# Patient Record
Sex: Male | Born: 1940 | Race: Black or African American | Hispanic: No | Marital: Single | Smoking: Former smoker
Health system: Southern US, Community
[De-identification: ages and names within clinical notes are randomized; demographics above are authoritative.]

## PROBLEM LIST (undated history)

## (undated) DIAGNOSIS — N390 Urinary tract infection, site not specified: Secondary | ICD-10-CM

## (undated) DIAGNOSIS — I639 Cerebral infarction, unspecified: Secondary | ICD-10-CM

## (undated) DIAGNOSIS — B019 Varicella without complication: Secondary | ICD-10-CM

## (undated) DIAGNOSIS — I209 Angina pectoris, unspecified: Secondary | ICD-10-CM

## (undated) DIAGNOSIS — E119 Type 2 diabetes mellitus without complications: Secondary | ICD-10-CM

## (undated) DIAGNOSIS — D492 Neoplasm of unspecified behavior of bone, soft tissue, and skin: Secondary | ICD-10-CM

## (undated) DIAGNOSIS — N529 Male erectile dysfunction, unspecified: Secondary | ICD-10-CM

## (undated) DIAGNOSIS — E785 Hyperlipidemia, unspecified: Secondary | ICD-10-CM

## (undated) DIAGNOSIS — I10 Essential (primary) hypertension: Secondary | ICD-10-CM

## (undated) DIAGNOSIS — I739 Peripheral vascular disease, unspecified: Secondary | ICD-10-CM

## (undated) DIAGNOSIS — K635 Polyp of colon: Secondary | ICD-10-CM

## (undated) DIAGNOSIS — R259 Unspecified abnormal involuntary movements: Secondary | ICD-10-CM

## (undated) DIAGNOSIS — F4310 Post-traumatic stress disorder, unspecified: Secondary | ICD-10-CM

## (undated) DIAGNOSIS — M1990 Unspecified osteoarthritis, unspecified site: Secondary | ICD-10-CM

## (undated) DIAGNOSIS — G4730 Sleep apnea, unspecified: Secondary | ICD-10-CM

## (undated) HISTORY — DX: Essential (primary) hypertension: I10

## (undated) HISTORY — DX: Unspecified osteoarthritis, unspecified site: M19.90

## (undated) HISTORY — DX: Type 2 diabetes mellitus without complications: E11.9

## (undated) HISTORY — PX: KNEE SURGERY: SHX244

## (undated) HISTORY — DX: Hyperlipidemia, unspecified: E78.5

## (undated) HISTORY — DX: Unspecified abnormal involuntary movements: R25.9

## (undated) HISTORY — DX: Peripheral vascular disease, unspecified: I73.9

## (undated) HISTORY — DX: Polyp of colon: K63.5

## (undated) HISTORY — DX: Male erectile dysfunction, unspecified: N52.9

## (undated) HISTORY — DX: Neoplasm of unspecified behavior of bone, soft tissue, and skin: D49.2

## (undated) HISTORY — PX: CARPAL TUNNEL RELEASE: SHX101

## (undated) HISTORY — PX: SHOULDER SURGERY: SHX246

## (undated) HISTORY — PX: NECK SURGERY: SHX720

## (undated) HISTORY — PX: HAND SURGERY: SHX662

## (undated) HISTORY — DX: Varicella without complication: B01.9

---

## 2005-03-31 ENCOUNTER — Encounter: Payer: Self-pay | Admitting: Family Medicine

## 2005-04-14 ENCOUNTER — Encounter: Payer: Self-pay | Admitting: Family Medicine

## 2008-12-16 ENCOUNTER — Encounter (INDEPENDENT_AMBULATORY_CARE_PROVIDER_SITE_OTHER): Payer: Self-pay | Admitting: *Deleted

## 2008-12-16 LAB — CONVERTED CEMR LAB
LDL Cholesterol: 89 mg/dL
Triglycerides: 78 mg/dL

## 2009-01-28 ENCOUNTER — Encounter: Payer: Self-pay | Admitting: Family Medicine

## 2009-09-29 ENCOUNTER — Ambulatory Visit: Payer: Self-pay | Admitting: Family Medicine

## 2009-09-29 DIAGNOSIS — R259 Unspecified abnormal involuntary movements: Secondary | ICD-10-CM | POA: Insufficient documentation

## 2009-09-29 DIAGNOSIS — E119 Type 2 diabetes mellitus without complications: Secondary | ICD-10-CM

## 2009-09-29 DIAGNOSIS — I10 Essential (primary) hypertension: Secondary | ICD-10-CM | POA: Insufficient documentation

## 2009-09-29 DIAGNOSIS — M25559 Pain in unspecified hip: Secondary | ICD-10-CM | POA: Insufficient documentation

## 2009-09-29 DIAGNOSIS — F17200 Nicotine dependence, unspecified, uncomplicated: Secondary | ICD-10-CM

## 2009-09-29 DIAGNOSIS — E785 Hyperlipidemia, unspecified: Secondary | ICD-10-CM | POA: Insufficient documentation

## 2009-09-29 DIAGNOSIS — E1169 Type 2 diabetes mellitus with other specified complication: Secondary | ICD-10-CM | POA: Insufficient documentation

## 2009-09-29 DIAGNOSIS — M25519 Pain in unspecified shoulder: Secondary | ICD-10-CM

## 2009-11-01 ENCOUNTER — Encounter: Payer: Self-pay | Admitting: Family Medicine

## 2009-11-11 ENCOUNTER — Ambulatory Visit: Payer: Self-pay | Admitting: Family Medicine

## 2009-11-11 DIAGNOSIS — N529 Male erectile dysfunction, unspecified: Secondary | ICD-10-CM

## 2009-11-17 ENCOUNTER — Encounter (INDEPENDENT_AMBULATORY_CARE_PROVIDER_SITE_OTHER): Payer: Self-pay | Admitting: *Deleted

## 2009-11-17 DIAGNOSIS — Z860100 Personal history of colon polyps, unspecified: Secondary | ICD-10-CM | POA: Insufficient documentation

## 2009-11-17 LAB — HM COLONOSCOPY

## 2009-12-06 ENCOUNTER — Ambulatory Visit: Payer: Self-pay | Admitting: Family Medicine

## 2009-12-06 DIAGNOSIS — D485 Neoplasm of uncertain behavior of skin: Secondary | ICD-10-CM | POA: Insufficient documentation

## 2009-12-06 DIAGNOSIS — I491 Atrial premature depolarization: Secondary | ICD-10-CM

## 2009-12-08 ENCOUNTER — Telehealth (INDEPENDENT_AMBULATORY_CARE_PROVIDER_SITE_OTHER): Payer: Self-pay | Admitting: *Deleted

## 2009-12-08 ENCOUNTER — Encounter: Payer: Self-pay | Admitting: Cardiology

## 2009-12-08 ENCOUNTER — Ambulatory Visit: Payer: Self-pay | Admitting: Cardiology

## 2009-12-08 DIAGNOSIS — I739 Peripheral vascular disease, unspecified: Secondary | ICD-10-CM | POA: Insufficient documentation

## 2009-12-08 DIAGNOSIS — M79609 Pain in unspecified limb: Secondary | ICD-10-CM

## 2009-12-08 DIAGNOSIS — R9431 Abnormal electrocardiogram [ECG] [EKG]: Secondary | ICD-10-CM | POA: Insufficient documentation

## 2009-12-08 DIAGNOSIS — R079 Chest pain, unspecified: Secondary | ICD-10-CM | POA: Insufficient documentation

## 2009-12-08 LAB — CONVERTED CEMR LAB
ALT: 20 units/L (ref 0–53)
AST: 18 units/L (ref 0–37)
Albumin: 4.2 g/dL (ref 3.5–5.2)
Alkaline Phosphatase: 156 units/L — ABNORMAL HIGH (ref 39–117)
CO2: 26 meq/L (ref 19–32)
Calcium: 9.2 mg/dL (ref 8.4–10.5)
Creatinine, Ser: 0.7 mg/dL (ref 0.4–1.5)
GFR calc non Af Amer: 136.63 mL/min (ref >60)
Glucose, Bld: 95 mg/dL (ref 70–99)
HDL: 69.9 mg/dL (ref >39.00)
Hgb A1c MFr Bld: 5.8 % (ref 4.6–6.5)
Sodium: 132 meq/L — ABNORMAL LOW (ref 135–145)
Total CHOL/HDL Ratio: 3
Triglycerides: 76 mg/dL (ref 0.0–149.0)

## 2009-12-13 ENCOUNTER — Ambulatory Visit: Payer: Self-pay | Admitting: Family Medicine

## 2009-12-14 ENCOUNTER — Telehealth (INDEPENDENT_AMBULATORY_CARE_PROVIDER_SITE_OTHER): Payer: Self-pay | Admitting: Radiology

## 2009-12-14 LAB — CONVERTED CEMR LAB
ALT: 19 units/L (ref 0–53)
BUN: 14 mg/dL (ref 6–23)
Bilirubin, Direct: 0.2 mg/dL (ref 0.0–0.3)
Chloride: 100 meq/L (ref 96–112)
GFR calc non Af Amer: 110.12 mL/min (ref >60)
Potassium: 4.5 meq/L (ref 3.5–5.1)
Sodium: 135 meq/L (ref 135–145)
Total Bilirubin: 0.9 mg/dL (ref 0.3–1.2)
Total Protein: 7.3 g/dL (ref 6.0–8.3)

## 2009-12-15 ENCOUNTER — Encounter: Payer: Self-pay | Admitting: Cardiovascular Disease

## 2009-12-15 ENCOUNTER — Encounter (HOSPITAL_COMMUNITY): Admission: RE | Admit: 2009-12-15 | Discharge: 2010-02-03 | Payer: Self-pay | Admitting: Cardiology

## 2009-12-15 ENCOUNTER — Ambulatory Visit: Payer: Self-pay | Admitting: Cardiovascular Disease

## 2009-12-15 ENCOUNTER — Ambulatory Visit: Payer: Self-pay

## 2009-12-15 ENCOUNTER — Encounter: Payer: Self-pay | Admitting: Cardiology

## 2009-12-17 ENCOUNTER — Telehealth (INDEPENDENT_AMBULATORY_CARE_PROVIDER_SITE_OTHER): Payer: Self-pay | Admitting: *Deleted

## 2009-12-17 LAB — CONVERTED CEMR LAB: PSA: 1.32 ng/mL (ref 0.10–4.00)

## 2010-01-06 LAB — CONVERTED CEMR LAB
ALT: 27 units/L
Creatinine, Ser: 1 mg/dL
HCT: 43.4 %
Hemoglobin: 15.4 g/dL
MCV: 89.7 fL
Platelets: 253 10*3/uL
RDW: 14.1 %
TSH: 1.4 microintl units/mL
WBC: 15.58 10*3/uL

## 2010-01-10 ENCOUNTER — Encounter: Payer: Self-pay | Admitting: Family Medicine

## 2010-01-24 ENCOUNTER — Ambulatory Visit: Payer: Self-pay | Admitting: Family Medicine

## 2010-01-26 ENCOUNTER — Ambulatory Visit: Payer: Self-pay | Admitting: Cardiology

## 2010-01-26 ENCOUNTER — Encounter: Payer: Self-pay | Admitting: Cardiology

## 2010-03-08 ENCOUNTER — Ambulatory Visit: Payer: Self-pay | Admitting: Cardiovascular Disease

## 2010-03-08 LAB — CONVERTED CEMR LAB
BUN: 7 mg/dL (ref 6–23)
Calcium: 9.5 mg/dL (ref 8.4–10.5)
Creatinine, Ser: 0.9 mg/dL (ref 0.4–1.5)
GFR calc non Af Amer: 114.54 mL/min (ref >60)
Glucose, Bld: 92 mg/dL (ref 70–99)
Potassium: 4.9 meq/L (ref 3.5–5.1)

## 2010-03-17 ENCOUNTER — Ambulatory Visit: Payer: Self-pay | Admitting: Cardiology

## 2010-04-01 ENCOUNTER — Encounter: Admission: RE | Admit: 2010-04-01 | Discharge: 2010-04-01 | Payer: Self-pay | Admitting: Orthopedic Surgery

## 2010-04-11 ENCOUNTER — Ambulatory Visit: Payer: Self-pay | Admitting: Cardiovascular Disease

## 2010-04-19 ENCOUNTER — Telehealth (INDEPENDENT_AMBULATORY_CARE_PROVIDER_SITE_OTHER): Payer: Self-pay | Admitting: *Deleted

## 2010-04-28 ENCOUNTER — Ambulatory Visit: Payer: Self-pay | Admitting: Family Medicine

## 2010-04-28 DIAGNOSIS — E669 Obesity, unspecified: Secondary | ICD-10-CM

## 2010-04-28 DIAGNOSIS — N3941 Urge incontinence: Secondary | ICD-10-CM

## 2010-04-28 LAB — HM DIABETES FOOT EXAM

## 2010-04-29 LAB — CONVERTED CEMR LAB
ALT: 26 units/L (ref 0–53)
Albumin: 4.2 g/dL (ref 3.5–5.2)
BUN: 14 mg/dL (ref 6–23)
Chloride: 104 meq/L (ref 96–112)
GFR calc non Af Amer: 111.46 mL/min (ref >60.00)
HDL: 60.8 mg/dL (ref >39.00)
Hgb A1c MFr Bld: 5.7 % (ref 4.6–6.5)
Total Bilirubin: 1 mg/dL (ref 0.3–1.2)
Total CHOL/HDL Ratio: 2
Triglycerides: 128 mg/dL (ref 0.0–149.0)
VLDL: 25.6 mg/dL (ref 0.0–40.0)

## 2010-06-01 ENCOUNTER — Telehealth (INDEPENDENT_AMBULATORY_CARE_PROVIDER_SITE_OTHER): Payer: Self-pay | Admitting: *Deleted

## 2010-06-13 ENCOUNTER — Encounter (INDEPENDENT_AMBULATORY_CARE_PROVIDER_SITE_OTHER): Payer: Self-pay | Admitting: *Deleted

## 2010-06-14 NOTE — Assessment & Plan Note (Signed)
Summary: Kenneth Henson   Visit Type:  Follow-up Primary Provider:  Neena Rhymes MD  CC:  Bilateral leg pain. Right leg worse.  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 The patient complains of longstanding left calf pain with walking over 1-2 years. He has stopped walking, partly due to calf pain and partly from hip arthritis. Pain occurs at 1/4 mile and forces him to stop at about 3/4 mile. He also has right leg pain and bilateral hip pain left worse than right. No ulcers or rest pain.  Current Medications (verified): 1)  Simvastatin 20 Mg Tabs (Simvastatin) .... Take One Tablet Daily 2)  Viagra 100 Mg Tabs (Sildenafil Citrate) .... Take 1/2-1 Tablet As Needed 1 Hour Prior 3)  Metformin Hcl 850 Mg Tabs (Metformin Hcl) .... Take One Tablet Daily 4)  Hydrochlorothiazide 12.5 Mg Caps (Hydrochlorothiazide) .... Take One Tablet Daily 5)  Lisinopril 40 Mg Tabs (Lisinopril) .... Take One Tablet Daily 6)  Onetouch Ultra Test  Strp (Glucose Blood) .... Test Two Times A Day 7)  Naprosyn 500 Mg Tabs (Naproxen) .Marland Kitchen.. 1 Two Times A Day X7-10 Days and Then As Needed For Pain.  Take W/ Food 8)  Onetouch Delica Lancets  Misc (Lancets) .... Use Two Times A Day  Allergies (verified): No Known Drug Allergies  Past History:  Past medical history reviewed for relevance to current acute and chronic problems.  Past Medical History: Reviewed history from 01/26/2010 and no changes required. HYPERTENSION HYPERLIPIDEMIA  CLAUDICATION  NEOPLASM, SKIN, UNCERTAIN BEHAVIOR COLONIC POLYPS, ADENOMATOUS, HX OF ERECTILE DYSFUNCTION, ORGANIC ABNORMAL INVOLUNTARY MOVEMENTS  DIABETES MELLITUS, TYPE II  Arthritis hx of Chicken Pox  Review of Systems          Vital Signs:  Patient profile:   70 year old male Height:      69 inches Weight:      240.25 pounds BMI:     35.61 Pulse rate:   76 / minute Pulse rhythm:   regular Resp:      18 per minute BP sitting:   140 / 84  (left arm) Cuff size:   large  Vitals Entered By: Vikki Ports (April 11, 2010 10:49 AM)  Serial Vital Signs/Assessments:  Time      Position  BP       Pulse  Resp  Temp     By           R Arm     138/80                         Vikki Ports   Physical Exam  General:  Pt alert and oriented, NAD Abd: soft, obese, NT Right leg: fem and PT pulse 2-3+ Left leg: fem pulse 1-2+, PT 2+ trace bilateral pedal edema feet warm   CT Scan  Procedure date:  03/17/2010  Findings:      Aorta:  The lower thoracic and abdominal aorta demonstrates very minimal calcific atherosclerosis particularly of the infrarenal aorta without occlusive process, dissection or significant aneurysm.  The celiac, SMA, single renal arteries, and IMA are all patent off of the aorta.  Negative for mesenteric or renal vascular occlusive disease.   Right Lower Extremity:  Mild calcific atherosclerosis of the right iliac system.  The right common, internal and external iliac arteries are patent.  Very minimal narrowing of the right external iliac artery, less than 50%.  No  significant iliac occlusive process or inflow disease detected.  The right common femoral, profunda femoral, and SFA demonstrate no significant stenosis, or occlusion.  Mild diffuse SFA nonocclusive atherosclerosis noted. Right popliteal artery is patent across the knee.  Anterior tibial, tibial peroneal trunk, posterior tibial, and diminutive peroneal arteries are all patent into the right lower extremity consistent with preserved right lower extremity three-vessel runoff.   Left Lower Extremity:  Diffuse mild calcific atherosclerosis of the left iliac system.  The left common and internal iliac arteries are patent.  The left external iliac artery demonstrates an irregular hypoechoic ulcerated plaque formation extending over approximately 2 cm on the sagittal reconstructions.  There is an  associated significant left external iliac stenosis, narrowing the lumen approximately 50-70%.  The appearance suggests a cleft-like ulcerated plaque formation versus a short segment focal dissection, if there has been prior iliac instrumentation.  Below this left external iliac lesion, the left common femoral, profunda femoral, and SFA remain patent.  Mild nonocclusive left SFA irregularity.  The left popliteal artery is patent across the knee. High origin is noted of the left anterior tibial artery.  Below-the- knee, the anterior tibial, peroneal and posterior tibial arteries patent.  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 His left leg waveforms are monophasic but of good amplitude and his leg pain is clearly multifactorial. He has shoulder replacement surgery approaching in January. I would favor seeing him back in 6 months for followup with continuation of medical therapy until that time. This will allow him to undergo and recover from surgery. We discussed the importance of continued abstinence from cigarettes and the need for weight loss (he has gained 20# since quitting cigarettes). He is on antiplatelet Rx with ASA 81 mg.  Patient Instructions: 1)  Your physician recommends that you schedule a follow-up appointment in: 6 months with Dr. Excell Seltzer 2)  Your physician recommends that you continue on your current medications as directed. Please refer to the Current Medication list given to you today.

## 2010-06-14 NOTE — Assessment & Plan Note (Signed)
Summary: Cardiology Nuclear Testing  Nuclear Med Background Indications for Stress Test: Evaluation for Ischemia   History: Echo  History Comments: '06 Stress Echo:OK per patient  Symptoms: Chest Pain, Chest Pain with Exertion, DOE, Fatigue  Symptoms Comments: Last episode of CP:1-2 weeks.   Nuclear Pre-Procedure Cardiac Risk Factors: Claudication, Family History - CAD, Hypertension, Lipids, NIDDM, Obesity, Smoker, TIA Caffeine/Decaff Intake: none NPO After: 10:00 PM Lungs: Clear.  O2 Sat 97% on RA. IV 0.9% NS with Angio Cath: 22g     IV Site: (R) AC IV Started by: Irean Hong RN Chest Size (in) 46     Height (in): 69 Weight (lb): 222 BMI: 32.90 Tech Comments: The patient had an LEA study today with (L) leg claudication, and has symptoms with walking incline, changed to lexiscan.Patsy Edwards,RN.  Nuclear Med Study 1 or 2 day study:  1 day     Stress Test Type:  Eugenie Birks Reading MD:  Charlton Haws, MD     Referring MD:  Olga Millers, MD Resting Radionuclide:  Technetium 71m Tetrofosmin     Resting Radionuclide Dose:  10.6 mCi  Stress Radionuclide:  Technetium 76m Tetrofosmin     Stress Radionuclide Dose:  33.0 mCi   Stress Protocol   Lexiscan: 0.4 mg   Stress Test Technologist:  Rea [employer] CMA-N     Nuclear Technologist:  Domenic Polite CNMT  Rest Procedure  Myocardial perfusion imaging was performed at rest 45 minutes following the intravenous administration of Myoview Technetium 7m Tetrofosmin.  Stress Procedure  The patient received IV Lexiscan 0.4 mg over 15-seconds.  Myoview injected at 30-seconds.  There were no significant changes with infusion, frequent PAC's.  Quantitative spect images were obtained after a 45 minute delay.  QPS Raw Data Images:  Normal; no motion artifact; normal heart/lung ratio. Stress Images:  NI: Uniform and normal uptake of tracer in all myocardial segments. Rest Images:  Normal homogeneous uptake in all areas of the  myocardium. Subtraction (SDS):  Normal Transient Ischemic Dilatation:  1.04  (Normal <1.22)  Lung/Heart Ratio:  .33  (Normal <0.45)  Quantitative Gated Spect Images QGS EDV:  98 ml QGS ESV:  36 ml QGS EF:  64 % QGS cine images:  normal  Findings Normal nuclear study      Overall Impression  Exercise Capacity: lexiscan BP Response: Normal blood pressure response. Clinical Symptoms: Lightheaded ECG Impression: No significant ST segment change suggestive of ischemia. Overall Impression: Normal stress nuclear study.  Appended Document: Cardiology Nuclear Testing ok  Appended Document: Cardiology Nuclear Testing pt aware of results

## 2010-06-14 NOTE — Progress Notes (Signed)
Summary: labs  Phone Note Outgoing Call   Call placed by: Doristine Devoid CMA,  December 08, 2009 9:06 AM Call placed to: Patient Summary of Call: diabetes is well controlled.  no changes  LDL is slightly higher than the 70 we like to see.  should start taking his cholesterol medicine nightly as directed  Given elevated Alk Phos please add a GGT  Na is mildly low.  needs to increase fluid intake and recheck in 1 week to trend level.   Follow-up for Phone Call        patient aware of labs and appt scheduled to recheck labs.......Marland KitchenDoristine Devoid CMA  December 08, 2009 9:07 AM

## 2010-06-14 NOTE — Medication Information (Signed)
Summary: Vacuum Erection Device/Pos T Vac  Vacuum Erection Device/Pos T Vac   Imported By: Lanelle Bal 11/04/2009 13:22:59  _____________________________________________________________________  External Attachment:    Type:   Image     Comment:   External Document

## 2010-06-14 NOTE — Procedures (Signed)
Summary: [facility]  [facility]   Imported By: Lanelle Bal 11/23/2009 11:41:53  _____________________________________________________________________  External Attachment:    Type:   Image     Comment:   External Document

## 2010-06-14 NOTE — Assessment & Plan Note (Signed)
Summary: NPV/CLAUDICATION   Visit Type:  Initial Consult Primary Provider:  Neena Rhymes MD  CC:  New PV evaluation per Dr.Crenshaw.  History of Present Illness: 70 year-old gentleman presents for initial evaluation of lower extremity PAD. The patient complains of longstanding left calf pain with walking over 1-2 years. He has stopped walking, partly due to calf pain and partly from hip arthritis. Pain occurs at 1/4 mile and forces him to stop at about 3/4 mile. No thigh pain. No right leg pain with walking. He has left hip pain with standing and lying on his left side. No rest pian or ulceration. No history of stroke but does have a history of remote TIA.   Current Medications (verified): 1)  Simvastatin 20 Mg Tabs (Simvastatin) .... Take One Tablet Daily 2)  Viagra 100 Mg Tabs (Sildenafil Citrate) .... Take 1/2-1 Tablet As Needed 1 Hour Prior 3)  Metformin Hcl 850 Mg Tabs (Metformin Hcl) .... Take One Tablet Daily 4)  Vitamin D (Ergocalciferol) 50000 Unit Caps (Ergocalciferol) .... Take One Tablet Weekly 5)  Hydrochlorothiazide 12.5 Mg Caps (Hydrochlorothiazide) .... Take One Tablet Daily 6)  Lisinopril 40 Mg Tabs (Lisinopril) .... Take One Tablet Daily 7)  Onetouch Ultra Test  Strp (Glucose Blood) .... Test Two Times A Day 8)  Naprosyn 500 Mg Tabs (Naproxen) .Marland Kitchen.. 1 Two Times A Day X7-10 Days and Then As Needed For Pain.  Take W/ Food 9)  Onetouch Delica Lancets  Misc (Lancets) .... Use Two Times A Day  Allergies (verified): No Known Drug Allergies  Past History:  Past medical, surgical, family and social histories (including risk factors) reviewed, and no changes noted (except as noted below).  Past Medical History: Reviewed history from 01/26/2010 and no changes required. HYPERTENSION HYPERLIPIDEMIA  CLAUDICATION  NEOPLASM, SKIN, UNCERTAIN BEHAVIOR COLONIC POLYPS, ADENOMATOUS, HX OF ERECTILE DYSFUNCTION, ORGANIC ABNORMAL INVOLUNTARY MOVEMENTS  DIABETES MELLITUS, TYPE II    Arthritis hx of Chicken Pox  Past Surgical History: Reviewed history from 12/08/2009 and no changes required. hand, knee, neck surgeries Carpal Tunnel surgery on right  Family History: Reviewed history from 12/08/2009 and no changes required. HTN-mother DM-no COLON CA-no STROKE-father PROSTATE CA-no  Social History: Reviewed history from 12/08/2009 and no changes required. 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  Vital Signs:  Patient profile:   70 year old male Height:      69 inches Weight:      229.50 pounds BMI:     34.01 Pulse rate:   58 / minute Pulse rhythm:   regular Resp:     18 per minute BP sitting:   120 / 72  (left arm) Cuff size:   large  Vitals Entered By: Vikki Ports (March 08, 2010 9:16 AM)  Serial Vital Signs/Assessments:  Time      Position  BP       Pulse  Resp  Temp     By           R Arm     128/74                         Vikki Ports   Physical Exam  General:  Pt is well-developed, obese male, alert and oriented, no acute distress HEENT:  normal Neck: no thyromegaly           JVP normal, carotid upstrokes normal without bruits Lungs: CTA Chest: equal expansion  CV: Apical impulse nondisplaced, RRR without murmur or gallop Abd: soft, NT, positive BS, no HSM, no bruit Back: no CVA tenderness Ext: no clubbing, cyanosis, or edema        femoral pulses 2+ right, 1+ left with femoral bruit        pedal pulses 2+ PT and trace DP Skin: warm, dry, no rash Neuro: CNII-XII intact,strength 5/5 = b/l    Arterial Doppler  Procedure date:  12/15/2009  Findings:      Suggestion of 1.1 left, 0.94 right, suggestion of left leg inflow disease.  Impression & Recommendations:  Problem # 1:  CLAUDICATION (ICD-443.9) Pt with clinical and  noninvasive findings suggestive of iliac disease. He has lifestyle limiting symptoms, but not at low-level walking. Favor CT angiography to evaluate his aortoiliac anatomy (he is at high risk of aneurysm with history of tobacco, HTN, and PAD). Will review CTA and make further recs pending that study. My threshold for angiography is fairly high as his symptoms are stable and he is able to walk 1/4 mile. Continue risk reduction measures as per Dr Jens Som. He quit smoking 5 weeks ago and I stressed the importance of continued tobacco cessation.  Other Orders: TLB-BMP (Basic Metabolic Panel-BMET) (80048-METABOL) CT Scan  (CT Scan)  Patient Instructions: 1)  Non-Cardiac CT Angiography (CTA), is a special type of CT scan that uses a computer to produce multi-dimensional views of major blood vessels throughout the body. In CT angiography, a contrast material is injected through an IV to help visualize the blood vessels. 2)  Your physician recommends that you have lab work today: BMP 3)  Your physician recommends that you continue on your current medications as directed. Please refer to the Current Medication list given to you today. 4)  Your physician wants you to follow-up in: 1 YEAR.   You will receive a reminder letter in the mail two months in advance. If you don't receive a letter, please call our office to schedule the follow-up appointment. 5)  DO NOT TAKE METFORMIN THE DAY OF CT OR 48 HOURS AFTER CT. DRINK PLENTY OF WATER AFTER TEST.

## 2010-06-14 NOTE — Assessment & Plan Note (Signed)
Summary: rto 2 months/cbs   Vital Signs:  Patient profile:   70 year old male Weight:      226 pounds Pulse rate:   105 / minute BP sitting:   140 / 80  (left arm)  Vitals Entered By: Doristine Devoid CMA (December 06, 2009 11:07 AM) CC: roa 2 month    History of Present Illness: 70 yo man here today for   1) DM- not checking sugars, lost his meter in the move.  no symptomatic lows.  last eye exam was November.  only on Metformin.  has recently had multiple steroid injxns from ortho.  2) Hyperlipidemia- not taking Simvastatin regularly.    3) HTN- elevated today b/c he's 'been on the phone w/ the internet people all morning'.  no CP, SOB, HAs, visual changes, edema.  Preventive Screening-Counseling & Management  Alcohol-Tobacco     Smoking Status: current     Smoking Cessation Counseling: yes     Smoke Cessation Stage: contemplative     Packs/Day: 0.5  Problems Prior to Update: 1)  Colonic Polyps, Adenomatous, Hx of  (ICD-V12.72) 2)  Erectile Dysfunction, Organic  (ICD-607.84) 3)  Abnormal Involuntary Movements  (ICD-781.0) 4)  Hip Pain, Left  (ICD-719.45) 5)  Shoulder, Pain  (ICD-719.41) 6)  Tobacco Use  (ICD-305.1) 7)  Hypertension  (ICD-401.9) 8)  Hyperlipidemia  (ICD-272.4) 9)  Diabetes Mellitus, Type II  (ICD-250.00)  Current Medications (verified): 1)  Simvastatin 20 Mg Tabs (Simvastatin) .... Take One Tablet Daily 2)  Viagra 100 Mg Tabs (Sildenafil Citrate) .... Take 1/2-1 Tablet As Needed 1 Hour Prior 3)  Metformin Hcl 850 Mg Tabs (Metformin Hcl) .... Take One Tablet Daily 4)  Vitamin D (Ergocalciferol) 50000 Unit Caps (Ergocalciferol) .... Take One Tablet Weekly 5)  Hydrochlorothiazide 12.5 Mg Caps (Hydrochlorothiazide) .... Take One Tablet Daily 6)  Lisinopril 40 Mg Tabs (Lisinopril) .... Take One Tablet Daily 7)  Onetouch Ultra Test  Strp (Glucose Blood) .... Test Two Times A Day 8)  Naprosyn 500 Mg Tabs (Naproxen) .Marland Kitchen.. 1 Two Times A Day X7-10 Days and Then As  Needed For Pain.  Take W/ Food 9)  Onetouch Delica Lancets  Misc (Lancets) .... Use Two Times A Day  Allergies (verified): No Known Drug Allergies  Past History:  Past Medical History: Last updated: 11/11/2009 Arthritis hx of Chicken Pox Diabetes mellitus, type II Hyperlipidemia Hypertension ED  Social History: Packs/Day:  0.5  Review of Systems      See HPI  Physical Exam  General:  Well-developed,well-nourished,in no acute distress; alert,appropriate and cooperative throughout examination Head:  NCAT Neck:  No deformities, masses, or tenderness noted. Lungs:  Normal respiratory effort, chest expands symmetrically. Lungs are clear to auscultation, no crackles or wheezes. Heart:  irregular S1/S2 w/ I/VI SEM heard best at RUSB Abdomen:  soft, NT/ND, +BS Pulses:  +2 carotid, radial, DP Extremities:  no C/C/E Skin:  hyperpigmented mole on pt's R forearm, growing per pt's report   Impression & Recommendations:  Problem # 1:  DIABETES MELLITUS, TYPE II (ICD-250.00) Assessment Unchanged pt given new meter.  due for A1C.  stressed importance of diet and exercise. His updated medication list for this problem includes:    Metformin Hcl 850 Mg Tabs (Metformin hcl) .Marland Kitchen... Take one tablet daily    Lisinopril 40 Mg Tabs (Lisinopril) .Marland Kitchen... Take one tablet daily  Orders: Venipuncture (60454) TLB-A1C / Hgb A1C (Glycohemoglobin) (83036-A1C) Prescription Created Electronically [phone])  Problem # 2:  HYPERLIPIDEMIA (ICD-272.4) Assessment: Unchanged  stressed the importance of regular use of medication, reviewed risks of untreated hyperlipidemia including MI and CVA. His updated medication list for this problem includes:    Simvastatin 20 Mg Tabs (Simvastatin) .Marland Kitchen... Take one tablet daily  Orders: TLB-Lipid Panel (80061-LIPID) TLB-Hepatic/Liver Function Pnl (80076-HEPATIC)  Problem # 3:  HYPERTENSION (ICD-401.9) Assessment: Deteriorated BP elevated today but pt upset about  customer service issue.  will follow closely. His updated medication list for this problem includes:    Hydrochlorothiazide 12.5 Mg Caps (Hydrochlorothiazide) .Marland Kitchen... Take one tablet daily    Lisinopril 40 Mg Tabs (Lisinopril) .Marland Kitchen... 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  EKG w/ PACs.  will refer to cards for additional evaluation.  Pt expresses understanding and is in agreement w/ this plan.  Orders: Cardiology Referral (Cardiology) EKG w/ Interpretation (93000)  Problem # 5:  NEOPLASM, SKIN, UNCERTAIN BEHAVIOR (ICD-238.2) Assessment: New  refer to derm for mole on pt's forearm.  Orders: Dermatology Referral (Derma)  Complete Medication List: 1)  Simvastatin 20 Mg Tabs (Simvastatin) .... Take one tablet daily 2)  Viagra 100 Mg Tabs (Sildenafil citrate) .... Take 1/2-1 tablet as needed 1 hour prior 3)  Metformin Hcl 850 Mg Tabs (Metformin hcl) .... Take one tablet daily 4)  Vitamin D (ergocalciferol) 50000 Unit Caps (Ergocalciferol) .... Take one tablet weekly 5)  Hydrochlorothiazide 12.5 Mg Caps (Hydrochlorothiazide) .... Take one tablet daily 6)  Lisinopril 40 Mg Tabs (Lisinopril) .... Take one tablet daily 7)  Onetouch Ultra Test Strp (Glucose blood) .... Test two times a day 8)  Naprosyn 500 Mg Tabs (Naproxen) .Marland Kitchen.. 1 two times a day x7-10 days and then as needed for pain.  take w/ food 9)  Onetouch Delica Lancets Misc (Lancets) .... Use two times a day  Patient Instructions: 1)  Please schedule a follow-up appointment in 3 months to recheck your diabetes. 2)  Someone will call you with your cardiology appt- this is not something to be concerned about, but worth following up 3)  Someone will call you with your derm appt 4)  We'll notify you of your lab results 5)  Call with any questions or  concerns 6)  Hang in there!  Prescriptions: ONETOUCH ULTRA TEST  STRP (GLUCOSE BLOOD) test two times a day  #100 x 2   Entered by:   Doristine Devoid CMA   Authorized by:   Neena Rhymes MD   Signed by:   Doristine Devoid CMA on 12/06/2009   Method used:   Electronically to        [facility] [phone]* (retail)       [address]       [place], [place]  [address]       Ph: [phone]       Fax: [phone]   RxID:   [id number]

## 2010-06-14 NOTE — Progress Notes (Signed)
Summary: labs  Phone Note Outgoing Call   Call placed by: Doristine Devoid CMA,  December 17, 2009 4:01 PM Call placed to: Patient Summary of Call: normal.  given normal PSA and elevated alk phos (that is not liver related) should repeat LFTs in 1 month.  if alk phos still elevated will need bone scan.  Follow-up for Phone Call        spoke w/ patient informed of results appt scheduled for recheck on labs.......Marland KitchenDoristine Devoid CMA  December 17, 2009 4:01 PM

## 2010-06-14 NOTE — Letter (Signed)
Summary: Letter to Patient with Lab Results/[facility]  Letter to Patient with Lab Results/[facility]   Imported By: Lanelle Bal 02/02/2010 09:56:02  _____________________________________________________________________  External Attachment:    Type:   Image     Comment:   External Document  Appended Document: Letter to Patient with Lab Results/[facility] please enter these into the flowsheet  Appended Document: Letter to Patient with Lab Results/[facility] Per Sunny Schlein this has been done.

## 2010-06-14 NOTE — Assessment & Plan Note (Signed)
Summary: [facility]   Visit Type:  Initial Consult  CC:  Abnormal EKG.  History of Present Illness: 70 year old male for evaluation of premature atrial contractions, chest pain and abnormal electrocardiogram. No prior cardiac history. The patient was recently seen by his primary care physician and noted to have PACs on his electrocardiogram. He also has dyspnea with more extreme activities. It is relieved with rest. No orthopnea, PND or pedal edema. He has not had syncope. He also occasionally feels chest pain that he attributes to indigestion. It does not radiate. It is not pleuritic or positional nor is it related to food. It is not exertional. It resolves spontaneously. Because of the above we were asked to further evaluate.  Preventive Screening-Counseling & Management  Alcohol-Tobacco     Smoking Status: current  Current Medications (verified): 1)  Simvastatin 20 Mg Tabs (Simvastatin) .... Take One Tablet Daily 2)  Viagra 100 Mg Tabs (Sildenafil Citrate) .... Take 1/2-1 Tablet As Needed 1 Hour Prior 3)  Metformin Hcl 850 Mg Tabs (Metformin Hcl) .... Take One Tablet Daily 4)  Vitamin D (Ergocalciferol) 50000 Unit Caps (Ergocalciferol) .... Take One Tablet Weekly 5)  Hydrochlorothiazide 12.5 Mg Caps (Hydrochlorothiazide) .... Take One Tablet Daily 6)  Lisinopril 40 Mg Tabs (Lisinopril) .... Take One Tablet Daily 7)  Onetouch Ultra Test  Strp (Glucose Blood) .... Test Two Times A Day 8)  Naprosyn 500 Mg Tabs (Naproxen) .Marland Kitchen.. 1 Two Times A Day X7-10 Days and Then As Needed For Pain.  Take W/ Food 9)  Onetouch Delica Lancets  Misc (Lancets) .... Use Two Times A Day  Allergies (verified): No Known Drug Allergies  Past History:  Past Medical History: Reviewed history from 11/11/2009 and no changes required. Arthritis hx of Chicken Pox Diabetes mellitus, type II Hyperlipidemia Hypertension ED  Past Surgical History: hand, knee, neck surgeries Carpal Tunnel surgery on  right  Family History: Reviewed history from 09/29/2009 and no changes required. HTN-mother DM-no COLON CA-no STROKE-father PROSTATE CA-no  Social History: Reviewed history from 09/29/2009 and no changes required. 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  Alcohol Use - yes  Review of Systems       Patient describes symptoms of claudication in both legs after walking approximately 1/4 of a mile. He also has arthralgias but no fevers or chills, productive cough, hemoptysis, dysphasia, odynophagia, melena, hematochezia, dysuria, hematuria, rash, seizure activity, orthopnea, PND, pedal edema. Remaining systems are negative.   Vital Signs:  Patient profile:   70 year old male Height:      69 inches Weight:      226 pounds BMI:     33.50 Pulse rate:   76 / minute Pulse rhythm:   regular Resp:     18 per minute BP sitting:   110 / 64  (left arm) Cuff size:   large  Vitals Entered By: Vikki Ports (December 08, 2009 9:53 AM)  Physical Exam  General:  Well developed/well nourished in NAD Skin warm/dry Patient not depressed No peripheral clubbing Back-normal HEENT-normal/normal eyelids Neck supple/normal carotid upstroke bilaterally; no bruits; no JVD; no thyromegaly chest - CTA/ normal expansion CV - RRR/normal S1 and S2; no  rubs or gallops;  PMI nondisplaced; 1/6 systolic murmur apex. Abdomen -NT/ND, no HSM, no mass, + bowel sounds, no bruit 2+ femoral pulses, no bruits Ext-no edema, chords, 2+ DP Neuro-grossly nonfocal     EKG  Procedure date:  12/06/2009  Findings:  Sinus rhythm at a rate of 69. Axis normal. Occasional PAC.  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 Add aspirin and continue statin. Schedule ABIs with Doppler. Discontinued tobacco use.  Problem # 2:  CHEST PAIN (ICD-786.50)  Symptoms atypical. However multiple risk factors.  Schedule Myoview for risk stratification. His updated medication list for this problem includes:    Lisinopril 40 Mg Tabs (Lisinopril) .Marland Kitchen... Take one tablet daily  His updated medication list for this problem includes:    Lisinopril 40 Mg Tabs (Lisinopril) .Marland Kitchen... 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 No palpitations or history of syncope. His updated medication list for this problem includes:    Lisinopril 40 Mg Tabs (Lisinopril) .Marland Kitchen... Take one tablet daily  His updated medication list for this problem includes:    Lisinopril 40 Mg Tabs (Lisinopril) .Marland Kitchen... Take one tablet daily  Problem # 4:  TOBACCO USE (ICD-305.1) Patient counseled on discontinuing.  Problem # 5:  HYPERTENSION (ICD-401.9)  Blood pressure controlled on present medications. Renal function and potassium monitored by primary care. His updated medication list for this problem includes:    Hydrochlorothiazide 12.5 Mg Caps (Hydrochlorothiazide) .Marland Kitchen... Take one tablet daily    Lisinopril 40 Mg Tabs (Lisinopril) .Marland Kitchen... Take one tablet daily  His updated medication list for this problem includes:    Hydrochlorothiazide 12.5 Mg Caps (Hydrochlorothiazide) .Marland Kitchen... Take one tablet daily    Lisinopril 40 Mg Tabs (Lisinopril) .Marland Kitchen... Take one tablet daily  Problem # 6:  HYPERLIPIDEMIA (ICD-272.4)  Continue statin. Lipids and liver monitored by primary care. His updated medication list for this problem includes:    Simvastatin 20 Mg Tabs (Simvastatin) .Marland Kitchen... Take one tablet daily  His updated medication list for this problem includes:    Simvastatin 20 Mg Tabs (Simvastatin) .Marland Kitchen... Take one tablet daily  Problem # 7:  DIABETES MELLITUS, TYPE II (ICD-250.00)  Continue present medications. His updated medication list for this problem includes:    Metformin Hcl 850 Mg Tabs (Metformin hcl) .Marland Kitchen... Take one tablet daily    Lisinopril 40 Mg Tabs (Lisinopril) .Marland Kitchen... Take  one tablet daily  His updated medication list for this problem includes:    Metformin Hcl 850 Mg Tabs (Metformin hcl) .Marland Kitchen... Take one tablet daily    Lisinopril 40 Mg Tabs (Lisinopril) .Marland Kitchen... Take one tablet daily  Other Orders: Nuclear Stress Test (Nuc Stress Test) Arterial Duplex Lower Extremity (Arterial Duplex Low)  Patient Instructions: 1)  Your physician recommends that you schedule a follow-up appointment in: [address] Kalyiah Saintil 2)  Your physician recommends that you continue on your current medications as directed. Please refer to the Current Medication list given to you today. 3)  Your physician has requested that you have an ankle brachial index (ABI). During this test an ultrasound and blood pressure cuff are used to evaluate the arteries that supply the arms and legs with blood. Allow thirty minutes for this exam. There are no restrictions or special instructions. 4)  Your physician has requested that you have a lower or upper extremity arterial duplex.  This test is an ultrasound of the arteries in the legs or arms.  It looks at arterial blood flow in the legs and arms.  Allow one hour for Lower and Upper Arterial scans. There are no restrictions or special instructions. 5)  Your physician has requested that you have an exercise stress myoview.  For further information please visit [email or web address]  Please follow instruction sheet, as given.

## 2010-06-14 NOTE — Progress Notes (Signed)
Summary: refill  Phone Note Refill Request Message from:  Fax from Pharmacy on April 19, 2010 10:36 AM  Refills Requested: Medication #1:  METFORMIN HCL 850 MG TABS take one tablet [facility] - fax [phone]  Initial call taken by: Okey Regal [place],  April 19, 2010 10:38 AM    Prescriptions: METFORMIN HCL 850 MG TABS (METFORMIN HCL) take one tablet daily  #30 x 3   Entered by:   Doristine Devoid CMA   Authorized by:   Neena Rhymes MD   Signed by:   Doristine Devoid CMA on 04/19/2010   Method used:   [facility]        [id number]       [address] [place] [address]       [place], [place]  [address]       Ph: [phone]       Fax: [phone]   RxID:   [id number]

## 2010-06-14 NOTE — Miscellaneous (Signed)
  Clinical Lists Changes  Problems: Added new problem of COLONIC POLYPS, ADENOMATOUS, HX OF (ICD-V12.72) - Signed Observations: Added new observation of COLONNXTDUE: 02/2012 (11/17/2009 15:44) Added new observation of PNEUMOVAX: Historical (08/27/2009 15:49) Added new observation of ZOSTAVAX: Historical (08/24/2009 15:49) Added new observation of TD BOOSTER: Historical (08/24/2009 15:49) Added new observation of COLONOSCOPY: Adenomatous Polyp (01/28/2009 15:49) Added new observation of TRIGLYC TOT: 78 mg/dL (16/02/9603 54:09) Added new observation of LDL: 89 mg/dL (81/19/1478 29:56) Added new observation of HDL: 76 mg/dL (21/30/8657 84:69) Added new observation of CHOLESTEROL: 181 mg/dL (62/95/2841 32:44)      Preventive Care Screening  Colonoscopy:    Date:  01/28/2009    Next Due:  02/2012    Results:  Adenomatous Polyp  Last Pneumovax:    Date:  08/27/2009    Results:  Historical  Last Tetanus Booster:    Date:  08/24/2009    Results:  Historical     Immunization History:  Zostavax History:    Zostavax # 1:  historical (08/24/2009)

## 2010-06-14 NOTE — Assessment & Plan Note (Signed)
Summary: discuss ed/cbs   Vital Signs:  Patient profile:   70 year old male Weight:      224 pounds Pulse rate:   70 / minute BP sitting:   130 / 70  (left arm)  Vitals Entered By: Doristine Devoid (November 11, 2009 1:07 PM) CC: discuss ed    History of Present Illness: 70 yo man here today to discuss ED.  has applied for vacuum pump assist device.  has been taking Viagra for 'years'- 'i can't hold an erection'.  will still have 'sporadic' nocturnal erections.  viagra will help acheive erection but can't maintain.  cialis and levitra w/ similar results- prefers viagra.  saw ad for vacuum pump and thought it would help.  pt would still like to be active w/ girlfriend.  had complete ED w/u w/ previous MD.  Current Medications (verified): 1)  Simvastatin 20 Mg Tabs (Simvastatin) .... Take One Tablet Daily 2)  Viagra 100 Mg Tabs (Sildenafil Citrate) .... Take 1/2-1 Tablet As Needed 1 Hour Prior 3)  Metformin Hcl 850 Mg Tabs (Metformin Hcl) .... Take One Tablet Daily 4)  Vitamin D (Ergocalciferol) 50000 Unit Caps (Ergocalciferol) .... Take One Tablet Weekly 5)  Hydrochlorothiazide 12.5 Mg Caps (Hydrochlorothiazide) .... Take One Tablet Daily 6)  Lisinopril 40 Mg Tabs (Lisinopril) .... Take One Tablet Daily 7)  Onetouch Ultra Test  Strp (Glucose Blood) .... Test Two Times A Day 8)  Naprosyn 500 Mg Tabs (Naproxen) .Marland Kitchen.. 1 Two Times A Day X7-10 Days and Then As Needed For Pain.  Take W/ Food  Allergies (verified): No Known Drug Allergies  Past History:  Past Medical History: Arthritis hx of Chicken Pox Diabetes mellitus, type II Hyperlipidemia Hypertension ED  Review of Systems      See HPI  Physical Exam  General:  Well-developed,well-nourished,in no acute distress; alert,appropriate and cooperative throughout examination Psych:  Cognition and judgment appear intact. Alert and cooperative with normal attention span and concentration. No apparent delusions, illusions,  hallucinations   Impression & Recommendations:  Problem # 1:  ERECTILE DYSFUNCTION, ORGANIC (ICD-607.84) Assessment New pt currently on viagra w/ ability to achieve erection but not maintain.  will complete paperwork for vacuum pump and fax office notes.  pt appreciative. His updated medication list for this problem includes:    Viagra 100 Mg Tabs (Sildenafil citrate) .Marland Kitchen... Take 1/2-1 tablet as needed 1 hour prior  Complete Medication List: 1)  Simvastatin 20 Mg Tabs (Simvastatin) .... Take one tablet daily 2)  Viagra 100 Mg Tabs (Sildenafil citrate) .... Take 1/2-1 tablet as needed 1 hour prior 3)  Metformin Hcl 850 Mg Tabs (Metformin hcl) .... Take one tablet daily 4)  Vitamin D (ergocalciferol) 50000 Unit Caps (Ergocalciferol) .... Take one tablet weekly 5)  Hydrochlorothiazide 12.5 Mg Caps (Hydrochlorothiazide) .... Take one tablet daily 6)  Lisinopril 40 Mg Tabs (Lisinopril) .... Take one tablet daily 7)  Onetouch Ultra Test Strp (Glucose blood) .... Test two times a day 8)  Naprosyn 500 Mg Tabs (Naproxen) .Marland Kitchen.. 1 two times a day x7-10 days and then as needed for pain.  take w/ food  Other Orders: Prescription Created Electronically [phone])  Patient Instructions: 1)  We'll complete the form for the pump and fax it in. 2)  Call if you need refills on the Viagra 3)  Have a great 4th of July! Prescriptions: HYDROCHLOROTHIAZIDE 12.5 MG CAPS (HYDROCHLOROTHIAZIDE) take one tablet daily  #30 x 6   Entered and Authorized by:   Neena Rhymes  MD   Signed by:   Neena Rhymes MD on 11/11/2009   Method used:   [facility]        [phone]       [address]       [place], [place]  [address]       Ph: [phone]       Fax: [phone]   RxID:   [id number]

## 2010-06-14 NOTE — Progress Notes (Signed)
Summary: Nuc pre-procedure  Phone Note Outgoing Call Call back at Home Phone [phone]   Call placed by: Darrick Penna Summary of Call: Left message with information on Myoview Information Sheet (see scanned document for details).      Nuclear Med Background Indications for Stress Test: Evaluation for Ischemia   History: Echo  History Comments: No previous cardiac history. Hx of hand,knee and neck surgeries. 06 Stress Echo at [place];no results in computer.  Symptoms: Chest Pain, DOE  Symptoms Comments: Atypical CP-indigestion. Hx of PAC's.   Nuclear Pre-Procedure Cardiac Risk Factors: Claudication, Hypertension, Lipids, NIDDM, Smoker Height (in): 69

## 2010-06-14 NOTE — Assessment & Plan Note (Signed)
Summary: [facility]   Visit Type:  2 months follow up  CC:  No complains.  History of Present Illness: 70 year old male I saw in July of 2011 for evaluation of premature atrial contractions, chest pain and abnormal electrocardiogram. Myoview 8/11 revealed an EF of 64% and normal perfusion. 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 Since he was last seen, the patient has dyspnea with more extreme activities but not with routine activities. It is relieved with rest. It is not associated with chest pain. There is no orthopnea, PND or pedal edema. There is no syncope or palpitations. There is no exertional chest pain. He continues to have claudication. It occurs after walking approximately 1/4 of a mile or less. It is worse on the left compared to the right. The pain is in his calves bilaterally and resolves with stopping his walking. It is limiting his activities.   Current Medications (verified): 1)  Simvastatin 20 Mg Tabs (Simvastatin) .... Take One Tablet Daily 2)  Viagra 100 Mg Tabs (Sildenafil Citrate) .... Take 1/2-1 Tablet As Needed 1 Hour Prior 3)  Metformin Hcl 850 Mg Tabs (Metformin Hcl) .... Take One Tablet Daily 4)  Vitamin D (Ergocalciferol) 50000 Unit Caps (Ergocalciferol) .... Take One Tablet Weekly 5)  Hydrochlorothiazide 12.5 Mg Caps (Hydrochlorothiazide) .... Take One Tablet Daily 6)  Lisinopril 40 Mg Tabs (Lisinopril) .... Take One Tablet Daily 7)  Onetouch Ultra Test  Strp (Glucose Blood) .... Test Two Times A Day 8)  Naprosyn 500 Mg Tabs (Naproxen) .Marland Kitchen.. 1 Two Times A Day X7-10 Days and Then As Needed For Pain.  Take W/ Food 9)  Onetouch Delica Lancets  Misc (Lancets) .... Use Two Times A Day  Allergies (verified): No Known Drug Allergies  Past History:  Past Medical History: HYPERTENSION HYPERLIPIDEMIA  CLAUDICATION  NEOPLASM, SKIN, UNCERTAIN BEHAVIOR COLONIC POLYPS, ADENOMATOUS, HX OF ERECTILE DYSFUNCTION,  ORGANIC ABNORMAL INVOLUNTARY MOVEMENTS  DIABETES MELLITUS, TYPE II  Arthritis hx of Chicken Pox  Past Surgical History: Reviewed history from 12/08/2009 and no changes required. hand, knee, neck surgeries Carpal Tunnel surgery on right  Social History: Reviewed history from 12/08/2009 and no changes required. 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  Alcohol Use - yes  Review of Systems       no fevers or chills, productive cough, hemoptysis, dysphasia, odynophagia, melena, hematochezia, dysuria, hematuria, rash, seizure activity, orthopnea, PND, pedal edema, claudication. Remaining systems are negative.   Vital Signs:  Patient profile:   70 year old male Height:      69 inches Weight:      219 pounds BMI:     32.46 Pulse rate:   68 / minute Pulse rhythm:   regular Resp:     18 per minute BP sitting:   126 / 70  (left arm) Cuff size:   regular  Vitals Entered By: Vikki Ports (January 26, 2010 9:15 AM)  Physical Exam  General:  Well-developed well-nourished in no acute distress.  Skin is warm and dry.  HEENT is normal.  Neck is supple. No thyromegaly.  Chest is clear to auscultation with normal expansion.  Cardiovascular exam is regular rate and rhythm.  Abdominal exam nontender or distended. No masses palpated. Extremities show no edema. neuro grossly intact    Impression & Recommendations:  Problem # 1:  CHEST PAIN (ICD-786.50) No further symptoms. Myoview normal. No further evaluation. His updated medication list for  this problem includes:    Lisinopril 40 Mg Tabs (Lisinopril) .Marland Kitchen... Take one tablet daily  Problem # 2:  HYPERTENSION (ICD-401.9) Blood pressure controlled on present medications. Will continue. His updated medication list for this problem includes:    Hydrochlorothiazide 12.5 Mg Caps (Hydrochlorothiazide) .Marland Kitchen... Take one tablet daily    Lisinopril 40 Mg Tabs (Lisinopril) .Marland Kitchen... Take one tablet  daily  Problem # 3:  HYPERLIPIDEMIA (ICD-272.4) Continue statin. Lipids and liver monitored by primary care. His updated medication list for this problem includes:    Simvastatin 20 Mg Tabs (Simvastatin) .Marland Kitchen... 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 I have asked him to take an aspirin daily and continue his statin. Discontinued tobacco use. I will refer to Dr. Excell Seltzer or Dr. Clifton James were consideration of arteriogram and intervention as needed.  Problem # 5:  TOBACCO USE (ICD-305.1) Patient counseled on discontinuing.  Problem # 6:  DIABETES MELLITUS, TYPE II (ICD-250.00)  His updated medication list for this problem includes:    Metformin Hcl 850 Mg Tabs (Metformin hcl) .Marland Kitchen... Take one tablet daily    Lisinopril 40 Mg Tabs (Lisinopril) .Marland Kitchen... Take one tablet daily  Other Orders: Misc. Referral (Misc. Ref)  Patient Instructions: 1)  Your physician recommends that you schedule a follow-up appointment in: AS NEEDED 2)  You have been referred to

## 2010-06-14 NOTE — Assessment & Plan Note (Signed)
Summary: new to est//lch   Vital Signs:  Patient profile:   70 year old male Height:      69 inches Weight:      222 pounds BMI:     32.90 Pulse rate:   60 / minute BP sitting:   132 / 78  (left arm)  Vitals Entered By: Doristine Devoid (Sep 29, 2009 10:21 AM) CC: NEW EST- L shoulder discomfort problems moving and sleeping   History of Present Illness: 70 yo man here today to establish.  moved 2 weeks ago from [place].  daughter lives locally, granddaughter, and great granddaughter local.  1) DM- A1C last checked in April.  usually well controlled on Metformin.  last eye exam 2 yrs ago.  2) HTN- adequately controlled on meds.  no CP, SOB, N/V, edema, HAs, visual changes  3) Hyperlipidemia- no problems tolerating medicine  4) Tobacco use- on Chantix  5) L shoulder pain- sxs started 'a couple years ago'.  temporary relief w/ prescription strength ibuprofen.  pain is worsening.  pain is worse w/ overhead motion.  unable to lie on L side.  radiates down to elbow.  no hx of trauma or injury.  6) R hand 'locking'- thumb and first finger 'lock' and have to be pried open.  no pain, but 'it scares me'.  hx of arthritis.  7) L hip pain- has seen ortho, received cortisone injxns in the past.  has had 2 epidurals.  Preventive Screening-Counseling & Management  Alcohol-Tobacco     Alcohol drinks/day: <1     Smoking Status: current     Smoking Cessation Counseling: yes     Smoke Cessation Stage: ready     Packs/Day: <0.25  Caffeine-Diet-Exercise     Does Patient Exercise: no      Sexual History:  currently monogamous.        Drug Use:  never.    Current Medications (verified): 1)  Simvastatin 20 Mg Tabs (Simvastatin) .... Take One Tablet Daily 2)  Viagra 100 Mg Tabs (Sildenafil Citrate) .... Take 1/2-1 Tablet As Needed 1 Hour Prior 3)  Metformin Hcl 850 Mg Tabs (Metformin Hcl) .... Take One Tablet Daily 4)  Vitamin D (Ergocalciferol) 50000 Unit Caps (Ergocalciferol) ....  Take One Tablet Weekly 5)  Hydrochlorothiazide 12.5 Mg Caps (Hydrochlorothiazide) .... Take One Tablet Daily 6)  Lisinopril 40 Mg Tabs (Lisinopril) .... Take One Tablet Daily 7)  Onetouch Ultra Test  Strp (Glucose Blood) .... Test Two Times A Day 8)  Naprosyn 500 Mg Tabs (Naproxen) .Marland Kitchen.. 1 Two Times A Day X7-10 Days and Then As Needed For Pain.  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 Eyes:  Denies blurring and double vision. CV:  Denies chest pain or discomfort, fainting, fatigue, palpitations, shortness of breath with exertion, swelling of feet, and swelling of hands. Resp:  Denies shortness of breath. GI:  Denies abdominal pain, nausea, and vomiting. MS:  Complains of joint pain; denies joint redness, joint swelling, and muscle weakness. Neuro:  Denies headaches.  Physical Exam  General:  Well-developed,well-nourished,in no acute distress; alert,appropriate and cooperative throughout examination Head:  NCAT Eyes:  PERRL, EOMI Neck:  No deformities, masses, or tenderness noted. Lungs:  Normal respiratory effort, chest expands symmetrically. Lungs are clear to auscultation, no crackles or wheezes. Heart:  Normal rate and regular rhythm. S1 and S2 normal without gallop, murmur, click, rub or other extra sounds. Msk:  L shoulder- pain w/ forward flexion, abduction, internal rotation.  + impingement signs.  R thumb  and index finger- no pain w/ palpation, full flexion and extension, no catching Pulses:  +2 carotid, radial, DP Extremities:  no C/C/E Neurologic:  strength normal in all extremities, sensation intact to light touch, and DTRs symmetrical and normal.     Impression & Recommendations:  Problem # 1:  DIABETES MELLITUS, TYPE II (ICD-250.00) Assessment New well controlled on Metformin.  A1C last checked in April.  due for eye exam.  will refer at upcoming DM check. His updated medication list for this problem includes:    Metformin Hcl 850 Mg Tabs (Metformin hcl) .Marland Kitchen... Take one tablet daily    Lisinopril 40 Mg Tabs (Lisinopril) .Marland Kitchen... Take one tablet daily  Problem # 2:  HYPERLIPIDEMIA (ICD-272.4) Assessment: New tolerating meds- due for labs in October His updated medication list for this problem includes:    Simvastatin 20 Mg Tabs (Simvastatin) .Marland Kitchen... Take one tablet daily  Problem # 3:  HYPERTENSION (ICD-401.9) Assessment: New adequately controlled.  asymptomatic.  continue meds His updated medication list for this problem includes:    Hydrochlorothiazide 12.5 Mg Caps (Hydrochlorothiazide) .Marland Kitchen... Take one tablet daily    Lisinopril 40 Mg Tabs (Lisinopril) .Marland Kitchen... Take one tablet daily  Problem # 4:  TOBACCO USE (ICD-305.1) Assessment: New currently on Chantix, will follow.  Problem # 5:  SHOULDER, PAIN (ICD-719.41) Assessment: New pt w/ impingement sxs and pain w/ most motion of L shoulder.  switch from ibuprofen to naproxen.  refer to ortho for possible injxn.  Pt expresses understanding and is in agreement w/ this plan. His updated medication list for this problem includes:    Naprosyn 500 Mg Tabs (Naproxen) .Marland Kitchen... 1 two times a day x7-10 days and then as needed for pain.  take w/ food  Orders: Orthopedic Referral (Ortho) Prescription Created Electronically [phone])  Problem # 6:  HIP PAIN, LEFT (ICD-719.45) Assessment: New pt previously seeing ortho for periodic injxns.  will  refer. His updated medication list for this problem includes:    Naprosyn 500 Mg Tabs (Naproxen) .Marland Kitchen... 1 two times a day x7-10 days and then as needed for pain.  take w/ food  Orders: Orthopedic Referral (Ortho)  Problem # 7:  ABNORMAL INVOLUNTARY MOVEMENTS (ICD-781.0) Assessment: New pt w/ apparant spasms of thumb and index finger.  pt denies trigger finger.  not painful.  offered option of watchful waiting vs referral to hand specialist- pt chose hand specialist. Orders: Orthopedic Referral (Ortho)  Complete Medication List: 1)  Simvastatin 20 Mg Tabs (Simvastatin) .... Take one tablet daily 2)  Viagra 100 Mg Tabs (Sildenafil citrate) .... Take 1/2-1 tablet as needed 1 hour prior 3)  Metformin Hcl 850 Mg Tabs (Metformin hcl) .... Take one tablet daily 4)  Vitamin D (ergocalciferol) 50000 Unit Caps (Ergocalciferol) .... Take one tablet weekly 5)  Hydrochlorothiazide 12.5 Mg Caps (Hydrochlorothiazide) .... Take one tablet daily 6)  Lisinopril 40 Mg Tabs (Lisinopril) .... Take one tablet daily 7)  Onetouch Ultra Test Strp (Glucose blood) .... Test two  times a day 8)  Naprosyn 500 Mg Tabs (Naproxen) .Marland Kitchen.. 1 two times a day x7-10 days and then as needed for pain.  take w/ food  Patient Instructions: 1)  Please schedule a follow-up appointment in 3 months to recheck diabetes- you can eat before this appt 2)  Someone will call you with your ortho appts 3)  If you need any med refills- call and let me know 4)  Take the Naproxen as directed- don't add any extra ibuprofen/motrin/Aleve but you can add tylenol as needed 5)  Call with any questions or concerns 6)  Welcome!  We're glad to have you!  Prescriptions: NAPROSYN 500 MG TABS (NAPROXEN) 1 two times a day x7-10 days and then as needed for pain.  take w/ food  #60 x 1   Entered and Authorized by:   Neena Rhymes MD   Signed by:   Neena Rhymes MD on 09/29/2009   Method used:   Electronically to        [facility] [phone]*  (retail)       [address]       [place], [place]  [address]       Ph: [phone]       Fax: [phone]   RxID:   [id number]

## 2010-06-16 NOTE — Assessment & Plan Note (Signed)
Summary: ov due on refill - review form from [facility]   Vital Signs:  Patient profile:   70 year old male Weight:      238 pounds BMI:     35.27 Pulse rate:   62 / minute BP sitting:   112 / 70  (left arm)  Vitals Entered By: Doristine Devoid CMA (April 28, 2010 9:51 AM) CC: dm f/u and review information from [place]   History of Present Illness: 70 yo man here today for   1) DM- not checking CBGs regularly.  last check was 121.  UTD on eye exam- done at [facility].  no symptomatic lows.  taking medicine every morning.  no CP, SOB, HAs, visual changes, N/V, edema.  will have intermittant loss of sensation in both feet.  2) PAD- seeing Dr Excell Seltzer, has 50-70% blockage.  plans are to stent femorals but hold off until after shoulder surgery due to the need for blood thinners after stenting.  3) Tobacco use- stopped smoking 9/14.  has had weight gain since quitting smoking.  would like something to suppress appetite- 'i can't stay out of that refrigerator'.  4) Urge incontinence- sxs started 1 yr ago.  has been embarrassed to talk about this.  'when i have to go, i have to go'.  unable to hold urine if has urge in public- will have accidents.  will have to go when hears running water.  had prostate exam w/ previous MD during complete physical this spring- no reported BPH.  Preventive Screening-Counseling & Management  Alcohol-Tobacco     Smoking Status: quit     Year Quit: 2011      Sexual History:  currently monogamous.        Drug Use:  never.    Current Medications (verified): 1)  Simvastatin 20 Mg Tabs (Simvastatin) .... Take One Tablet Daily 2)  Viagra 100 Mg Tabs (Sildenafil Citrate) .... Take 1/2-1 Tablet As Needed 1 Hour Prior 3)  Metformin Hcl 850 Mg Tabs (Metformin Hcl) .... Take One Tablet Daily 4)  Hydrochlorothiazide 12.5 Mg Caps (Hydrochlorothiazide) .... Take One Tablet Daily 5)  Lisinopril 40 Mg Tabs (Lisinopril) .... Take One Tablet Daily 6)  Onetouch Ultra Test  Strp (Glucose  Blood) .... Test Two Times A Day 7)  Onetouch Delica Lancets  Misc (Lancets) .... Use Two Times A Day 8)  Aspirin 81 Mg Tbec (Aspirin) .... Take One Tablet By Mouth Daily  Allergies (verified): No Known Drug Allergies  Past History:  Past medical, surgical, family and social histories (including risk factors) reviewed, and no changes noted (except as noted below).  Past Medical History: Reviewed history from 01/26/2010 and no changes required. HYPERTENSION HYPERLIPIDEMIA  CLAUDICATION  NEOPLASM, SKIN, UNCERTAIN BEHAVIOR COLONIC POLYPS, ADENOMATOUS, HX OF ERECTILE DYSFUNCTION, ORGANIC ABNORMAL INVOLUNTARY MOVEMENTS  DIABETES MELLITUS, TYPE II  Arthritis hx of Chicken Pox  Past Surgical History: Reviewed history from 12/08/2009 and no changes required. hand, knee, neck surgeries Carpal Tunnel surgery on right  Family History: Reviewed history from 12/08/2009 and no changes required. HTN-mother DM-no COLON CA-no STROKE-father PROSTATE CA-no  Social History: Reviewed history from 03/08/2010 and no changes required. 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 Lungs:  Normal respiratory effort, chest expands symmetrically. Lungs are clear to auscultation, no crackles or wheezes. Heart:  irregular S1/S2 w/ I/VI SEM heard best at RUSB Abdomen:  soft, NT/ND, +BS Rectal:  deferred at pt's request Prostate:  deferred at pt's request Pulses:  +2 carotid, radial, very weak DP on L Extremities:  no C/C/E Neurologic:  alert & oriented X3, cranial nerves II-XII intact, sensation intact to light touch, gait normal, and DTRs  symmetrical and normal.   Psych:  Cognition and judgment appear intact. Alert and cooperative with normal attention span and concentration. No apparent delusions, illusions, hallucinations  Diabetes Management Exam:    Foot Exam (with socks and/or shoes not present):       Sensory-Monofilament:          Left foot: diminished          Right foot: diminished       Inspection:          Left foot: normal          Right foot: normal       Nails:          Left foot: thickened          Right foot: thickened   Impression & Recommendations:  Problem # 1:  DIABETES MELLITUS, TYPE II (ICD-250.00) Assessment Unchanged not checking sugars.  stressed importance of healthy diet especially since he has upcoming shoulder surgery and will be limited in exercise.  check labs and adjust meds as needed. His updated medication list for this problem includes:    Metformin Hcl 850 Mg Tabs (Metformin hcl) .Marland Kitchen... Take one tablet daily    Lisinopril 40 Mg Tabs (Lisinopril) .Marland Kitchen... Take one tablet daily    Aspirin 81 Mg Tbec (Aspirin) .Marland Kitchen... Take one tablet by mouth daily  Orders: Venipuncture (04540) Specimen Handling (98119) TLB-A1C / Hgb A1C (Glycohemoglobin) (83036-A1C) TLB-BMP (Basic Metabolic Panel-BMET) (80048-METABOL)  Problem # 2:  TOBACCO USE (ICD-305.1) Assessment: Improved QUIT!  applauded pt's efforts  Problem # 3:  OBESITY (ICD-278.00) Assessment: New encouraged pt to find healthier outlet for oral fixation than food- gum, lollipop, etc.  will start phenteramine as appetite suppressant especially w/ upcoming surgery and his limited ability to exercise.  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  refer to urology.  pt reports prostate was WNL at recent physical and declined repeat exam.  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  Complete  Medication List: 1)  Simvastatin 20 Mg Tabs (Simvastatin) .... Take one tablet daily 2)  Viagra 100 Mg Tabs (Sildenafil citrate) .... Take 1/2-1 tablet as needed 1 hour prior 3)  Metformin Hcl 850 Mg Tabs (Metformin hcl) .... Take one tablet daily 4)  Hydrochlorothiazide 12.5 Mg Caps (Hydrochlorothiazide) .... Take one tablet daily 5)  Lisinopril 40 Mg Tabs (Lisinopril) .... Take one tablet daily 6)  Onetouch Ultra Test Strp (Glucose blood) .... Test two times a day 7)  Onetouch Delica Lancets Misc (Lancets) .... Use two times a day 8)  Aspirin 81 Mg Tbec (Aspirin) .... Take one tablet by mouth daily  Other Orders: TLB-Lipid Panel (80061-LIPID) TLB-Hepatic/Liver Function Pnl (80076-HEPATIC)  Patient Instructions: 1)  Follow up in 3 months to recheck the diabetes 2)  We'll notify you of your lab results 3)  Someone will call you with your urology appt 4)  Start the Toviaz daily for symptoms of urgency and frequency- if no improvement in 2-3 weeks, call and we'll refer you to urology 5)  Start the Phenteramine daily for appetite suppression 6)  Try and make healthy food choices 7)  We'll notify you when your records are ready for pick up 8)  Call with any questions or concerns 9)  Happy Holidays!!! Prescriptions: PHENTERMINE HCL 15 MG CAPS (PHENTERMINE HCL) 1 tab every morning  #30 x 2   Entered and Authorized by:   Neena Rhymes MD   Signed by:   Neena Rhymes MD on 04/28/2010   Method used:   Print then Give to Patient   RxID:   [id number]    Orders Added: 1)  Venipuncture [30865] 2)  Specimen Handling [99000] 3)  TLB-A1C / Hgb A1C (Glycohemoglobin) [83036-A1C] 4)  TLB-BMP (Basic Metabolic Panel-BMET) [80048-METABOL] 5)  TLB-Lipid Panel [80061-LIPID] 6)  TLB-Hepatic/Liver Function Pnl [80076-HEPATIC] 7)  Urology Referral [Urology] 8)  Est. Patient Level IV [78469]  Appended Document: ov due on refill - review form from [facility] ED- doing well on Viagra and w/  pump.  unable to determine whether this was caused by HTN, DM, PAD or combination of all of the above.

## 2010-06-16 NOTE — Progress Notes (Signed)
Summary: REFILL  Phone Note Refill Request Call back at [phone] Message from:  Pharmacy on June 01, 2010 4:23 PM  Refills Requested: Medication #1:  METFORMIN HCL 850 MG TABS take one tablet daily   Dosage confirmed as above?Dosage Confirmed   Supply Requested: 3 months [facility]  Next Appointment Scheduled: NONE Initial call taken by: Lavell Islam,  June 01, 2010 4:23 PM    Prescriptions: LISINOPRIL 40 MG TABS (LISINOPRIL) take one tablet daily  #90 x 1   Entered by:   Doristine Devoid CMA   Authorized by:   Neena Rhymes MD   Signed by:   Doristine Devoid CMA on 06/01/2010   Method used:   Electronically to        [facility]* (mail-order)       [address]       [place], [place]  [address]       Ph: [phone]       Fax: [phone]   RxID:   [id number] HYDROCHLOROTHIAZIDE 12.5 MG CAPS (HYDROCHLOROTHIAZIDE) take one tablet daily  #90 x 1   Entered by:   Doristine Devoid CMA   Authorized by:   Neena Rhymes MD   Signed by:   Doristine Devoid CMA on 06/01/2010   Method used:   Electronically to        [facility]* (mail-order)       [address]       [place], [place]  [address]       Ph: [phone]       Fax: [phone]   RxID:   [id number] METFORMIN HCL 850 MG TABS (METFORMIN HCL) take one tablet daily  #90 x 1   Entered by:   Doristine Devoid CMA   Authorized by:   Neena Rhymes MD   Signed by:   Doristine Devoid CMA on 06/01/2010   Method used:   Electronically to        [facility]* (mail-order)       [address]       [place], [place]  [address]       Ph: [phone]       Fax: [phone]   RxID:   [id number]   Appended Document: REFILL    Clinical Lists Changes  Medications: Rx of METFORMIN HCL 850 MG TABS (METFORMIN HCL) take one tablet daily;  #90 x 1;  Signed;  Entered by: Doristine Devoid CMA;  Authorized by: Neena Rhymes  MD;  Method used: Printed then faxed to [facility]*, [address] [place], [place]  [address], Ph: [phone], Fax: [phone] Rx of HYDROCHLOROTHIAZIDE 12.5 MG CAPS (HYDROCHLOROTHIAZIDE) take one tablet daily;  #90 x 1;  Signed;  Entered by: Doristine Devoid CMA;  Authorized by: Neena Rhymes MD;  Method used: Printed then faxed to [facility]*, [address], [place], [place]  [address], Ph: [phone], Fax: [phone] Rx of LISINOPRIL 40 MG TABS (LISINOPRIL) take one tablet daily;  #90 x 1;  Signed;  Entered by: Doristine Devoid CMA;  Authorized by: Neena Rhymes MD;  Method used: Printed then faxed to [facility]*, [address], [place], [place]  [address], Ph: [phone], Fax: [phone]    Prescriptions: LISINOPRIL 40 MG TABS (LISINOPRIL) take one tablet daily  #90 x 1   Entered by:   Doristine Devoid CMA   Authorized by:   Neena Rhymes MD   Signed by:   Tama Gander  Jones CMA on 06/01/2010   Method used:   Printed then faxed to ...       [facility] (mail[facility]order)       [address]       [place], [place]  [address]       Ph: [phone]       Fax: [phone]   RxID:   [id number] HYDROCHLOROTHIAZIDE 12.5 MG CAPS (HYDROCHLOROTHIAZIDE) take one tablet daily  #90 x 1   Entered by:   Doristine Devoid CMA   Authorized by:   Neena Rhymes MD   Signed by:   Doristine Devoid CMA on 06/01/2010   Method used:   Printed then faxed to ...       [facility] (mail[facility]order)       [address]       [place], [place]  [address]       Ph: [phone]       Fax: [phone]   RxID:   [id number] METFORMIN HCL 850 MG TABS (METFORMIN HCL) take one tablet daily  #90 x 1   Entered by:   Doristine Devoid CMA   Authorized by:   Neena Rhymes MD   Signed by:   Doristine Devoid CMA on 06/01/2010   Method used:   Printed then faxed to ...        [facility] (mail[facility])       [address]       [place], [place]  [address]       Ph: [phone]       Fax: [phone]   RxID:   [id number]

## 2010-06-16 NOTE — Progress Notes (Signed)
Summary: Paperwork from [facility] Brought by Patient (Page 2 & 3 Only)  Paperwork from [employer] by Patient (Page 2 & 3 Only)   Imported By: Lanelle Bal 05/06/2010 12:22:35  _____________________________________________________________________  External Attachment:    Type:   Image     Comment:   External Document

## 2010-06-22 NOTE — Miscellaneous (Signed)
  Clinical Lists Changes  Medications: Changed medication from HYDROCHLOROTHIAZIDE 12.5 MG CAPS (HYDROCHLOROTHIAZIDE) take one tablet daily to HYDROCHLOROTHIAZIDE 12.5 MG CAPS (HYDROCHLOROTHIAZIDE) take one capsule daily

## 2010-08-04 ENCOUNTER — Encounter: Payer: Self-pay | Admitting: Family Medicine

## 2010-08-04 ENCOUNTER — Ambulatory Visit (INDEPENDENT_AMBULATORY_CARE_PROVIDER_SITE_OTHER): Payer: Medicare Other | Admitting: Family Medicine

## 2010-08-04 VITALS — BP 120/80 | Temp 98.8°F | Ht 69.0 in | Wt 246.2 lb

## 2010-08-04 DIAGNOSIS — Z136 Encounter for screening for cardiovascular disorders: Secondary | ICD-10-CM

## 2010-08-04 DIAGNOSIS — Z01818 Encounter for other preprocedural examination: Secondary | ICD-10-CM

## 2010-08-04 DIAGNOSIS — N529 Male erectile dysfunction, unspecified: Secondary | ICD-10-CM

## 2010-08-04 DIAGNOSIS — E119 Type 2 diabetes mellitus without complications: Secondary | ICD-10-CM

## 2010-08-04 MED ORDER — SILDENAFIL CITRATE 100 MG PO TABS: 100.0000 mg | ORAL_TABLET | ORAL | Status: DC | PRN

## 2010-08-04 NOTE — Progress Notes (Signed)
Subjective:    Patient ID: Kenneth Henson, male    DOB: 11-Jan-1941, 70 y.o.   MRN: [id number]  HPI   Review of Systems     Objective:   Physical Exam      Kenneth Henson is a 70 y.o. male who presents to the office today for a preoperative consultation at the request of surgeon Dr Ranell Patrick who plans on performing L shoulder replacement on TBD   . This consultation is requested for the specific conditions prompting preoperative evaluation (i.e. because of potential affect on operative risk): HTN, Hyperlipidemia, DM. Planned anesthesia: general. The patient has the following known anesthesia issues: none. Patients bleeding risk: no recent abnormal bleeding. However is on 81 mg ASA daily which he can stop at any time.  Patient does not have objections to receiving blood products if needed.  The following portions of the patient's history were reviewed and updated as appropriate: allergies, current medications, past medical history, past social history, past surgical history and problem list.  Review of Systems A comprehensive review of systems was negative.    Objective:    BP 120/80  Temp(Src) 98.8 F (37.1 C) (Oral)  Ht 5\' 9"  (1.753 m)  Wt 246 lb 4 oz (111.698 kg)  BMI 36.36 kg/m2  General Appearance:    Alert, cooperative, no distress, appears stated age  Head:    Normocephalic, without obvious abnormality, atraumatic  Eyes:    PERRL, conjunctiva/corneas clear, EOM's intact, fundi    benign      Nose:   Nares normal, septum midline, mucosa normal, no drainage    or sinus tenderness  Throat:   Lips, mucosa, and tongue normal; teeth and gums normal  Neck:   Supple, symmetrical, trachea midline, no adenopathy;       thyroid:  No enlargement/tenderness/nodules     Lungs:     Clear to auscultation bilaterally, respirations unlabored  Chest wall:    No tenderness or deformity  Heart:    Regular rate and rhythm, S1 and S2 normal, no murmur, rub   or gallop  Abdomen:     Soft,  non-tender, bowel sounds active all four quadrants,    no masses, no organomegaly        Extremities:   Extremities normal, atraumatic, no cyanosis or edema  Pulses:   2+ and symmetric all extremities  Skin:   Skin color, texture, turgor normal, no rashes or lesions  Lymph nodes:   Cervical, supraclavicular, and axillary nodes normal  Neurologic:   CNII-XII intact.    Predictors of intubation difficulty:  Morbid obesity? yes  Anatomically abnormal facies? no  Prominent incisors? No- pt has upper dentures (removable) and lower implants  Receding mandible? no  Short, thick neck? yes  Neck range of motion: normal   Cardiographics ECG: normal sinus rhythm, no blocks or conduction defects, no ischemic changes   Imaging Chest x-ray: N/A   Lab Review : see attached     Assessment:      70 y.o. male with planned surgery as above.   Known risk factors for perioperative complications: Diabetes mellitus   Difficulty with intubation is not anticipated.   Current medications which may produce withdrawal symptoms if withheld perioperatively: none    Plan:    1. Preoperative workup as follows none. 2. Change in medication regimen before surgery: discontinue ASA 14 days before surgery and discontinue Metformin 24 hours before surgery. 3. Deep vein thrombosis prophylaxis postoperatively:regimen to be chosen by surgical  team.

## 2010-08-04 NOTE — Patient Instructions (Signed)
We will fax your office note, EKG and paperwork to Dr Ranell Patrick Call with any questions or concerns GOOD LUCK!!!!

## 2010-08-08 ENCOUNTER — Encounter: Payer: Self-pay | Admitting: *Deleted

## 2010-09-08 ENCOUNTER — Ambulatory Visit (HOSPITAL_COMMUNITY)
Admission: RE | Admit: 2010-09-08 | Discharge: 2010-09-08 | Disposition: A | Discharge status: Home / Self Care | Payer: Medicare Other | Source: Ambulatory Visit | Attending: Orthopedic Surgery | Admitting: Orthopedic Surgery

## 2010-09-08 ENCOUNTER — Telehealth: Payer: Self-pay | Admitting: Cardiovascular Disease

## 2010-09-08 ENCOUNTER — Other Ambulatory Visit (HOSPITAL_COMMUNITY): Payer: Self-pay | Admitting: Orthopedic Surgery

## 2010-09-08 ENCOUNTER — Encounter (HOSPITAL_COMMUNITY)
Admission: RE | Admit: 2010-09-08 | Discharge: 2010-09-08 | Disposition: A | Discharge status: Home / Self Care | Payer: Medicare Other | Source: Ambulatory Visit | Attending: Orthopedic Surgery | Admitting: Orthopedic Surgery

## 2010-09-08 DIAGNOSIS — Z01811 Encounter for preprocedural respiratory examination: Secondary | ICD-10-CM

## 2010-09-08 DIAGNOSIS — E119 Type 2 diabetes mellitus without complications: Secondary | ICD-10-CM | POA: Insufficient documentation

## 2010-09-08 DIAGNOSIS — Z01818 Encounter for other preprocedural examination: Secondary | ICD-10-CM | POA: Insufficient documentation

## 2010-09-08 DIAGNOSIS — I10 Essential (primary) hypertension: Secondary | ICD-10-CM | POA: Insufficient documentation

## 2010-09-08 DIAGNOSIS — Z01812 Encounter for preprocedural laboratory examination: Secondary | ICD-10-CM | POA: Insufficient documentation

## 2010-09-08 LAB — URINALYSIS, ROUTINE W REFLEX MICROSCOPIC
Glucose, UA: NEGATIVE mg/dL
Ketones, ur: NEGATIVE mg/dL
Nitrite: NEGATIVE
Protein, ur: NEGATIVE mg/dL
Urobilinogen, UA: 1 mg/dL (ref 0.0–1.0)

## 2010-09-08 LAB — CBC
Hemoglobin: 14.4 g/dL (ref 13.0–17.0)
MCH: 30.2 pg (ref 26.0–34.0)
MCHC: 34.4 g/dL (ref 30.0–36.0)
Platelets: 211 10*3/uL (ref 150–400)
RDW: 13.3 % (ref 11.5–15.5)

## 2010-09-08 LAB — BASIC METABOLIC PANEL
BUN: 9 mg/dL (ref 6–23)
CO2: 28 mEq/L (ref 19–32)
GFR calc non Af Amer: >60 mL/min (ref >60)
Glucose, Bld: 110 mg/dL — ABNORMAL HIGH (ref 70–99)
Potassium: 4.7 mEq/L (ref 3.5–5.1)
Sodium: 134 mEq/L — ABNORMAL LOW (ref 135–145)

## 2010-09-08 LAB — DIFFERENTIAL
Basophils Absolute: 0 10*3/uL (ref 0.0–0.1)
Basophils Relative: 0 % (ref 0–1)
Eosinophils Absolute: 0.9 10*3/uL — ABNORMAL HIGH (ref 0.0–0.7)
Monocytes Absolute: 0.8 10*3/uL (ref 0.1–1.0)
Neutro Abs: 4.8 10*3/uL (ref 1.7–7.7)

## 2010-09-08 LAB — PROTIME-INR: Prothrombin Time: 13 seconds (ref 11.6–15.2)

## 2010-09-08 NOTE — Telephone Encounter (Signed)
Faxed Stress to [facility] ([phone]).

## 2010-09-16 ENCOUNTER — Inpatient Hospital Stay (HOSPITAL_COMMUNITY)
Admission: RE | Admit: 2010-09-16 | Discharge: 2010-09-18 | DRG: 484 | Disposition: A | Discharge status: Home Health | Payer: Medicare Other | Source: Ambulatory Visit | Attending: Orthopedic Surgery | Admitting: Orthopedic Surgery

## 2010-09-16 ENCOUNTER — Inpatient Hospital Stay (HOSPITAL_COMMUNITY): Payer: Medicare Other

## 2010-09-16 DIAGNOSIS — M109 Gout, unspecified: Secondary | ICD-10-CM | POA: Diagnosis present

## 2010-09-16 DIAGNOSIS — E669 Obesity, unspecified: Secondary | ICD-10-CM | POA: Diagnosis present

## 2010-09-16 DIAGNOSIS — E119 Type 2 diabetes mellitus without complications: Secondary | ICD-10-CM | POA: Diagnosis present

## 2010-09-16 DIAGNOSIS — Z7982 Long term (current) use of aspirin: Secondary | ICD-10-CM

## 2010-09-16 DIAGNOSIS — I10 Essential (primary) hypertension: Secondary | ICD-10-CM | POA: Diagnosis present

## 2010-09-16 DIAGNOSIS — Z8673 Personal history of transient ischemic attack (TIA), and cerebral infarction without residual deficits: Secondary | ICD-10-CM

## 2010-09-16 DIAGNOSIS — I739 Peripheral vascular disease, unspecified: Secondary | ICD-10-CM | POA: Diagnosis present

## 2010-09-16 DIAGNOSIS — K449 Diaphragmatic hernia without obstruction or gangrene: Secondary | ICD-10-CM | POA: Diagnosis present

## 2010-09-16 DIAGNOSIS — M19019 Primary osteoarthritis, unspecified shoulder: Principal | ICD-10-CM | POA: Diagnosis present

## 2010-09-16 LAB — GLUCOSE, CAPILLARY
Glucose-Capillary: 118 mg/dL — ABNORMAL HIGH (ref 70–99)
Glucose-Capillary: 105 mg/dL — ABNORMAL HIGH (ref 70–99)

## 2010-09-16 LAB — TYPE AND SCREEN
ABO/RH(D): O POS
Antibody Screen: NEGATIVE

## 2010-09-16 LAB — ABO/RH: ABO/RH(D): O POS

## 2010-09-17 LAB — CBC
HCT: 32.9 % — ABNORMAL LOW (ref 39.0–52.0)
Hemoglobin: 11.2 g/dL — ABNORMAL LOW (ref 13.0–17.0)
MCV: 87 fL (ref 78.0–100.0)
RBC: 3.78 MIL/uL — ABNORMAL LOW (ref 4.22–5.81)
RDW: 13.6 % (ref 11.5–15.5)
WBC: 11.7 10*3/uL — ABNORMAL HIGH (ref 4.0–10.5)

## 2010-09-17 LAB — GLUCOSE, CAPILLARY: Glucose-Capillary: 110 mg/dL — ABNORMAL HIGH (ref 70–99)

## 2010-09-17 LAB — BASIC METABOLIC PANEL
CO2: 25 mEq/L (ref 19–32)
Calcium: 8.6 mg/dL (ref 8.4–10.5)
Chloride: 99 mEq/L (ref 96–112)
Creatinine, Ser: 0.79 mg/dL (ref 0.4–1.5)
Potassium: 4 mEq/L (ref 3.5–5.1)
Sodium: 132 mEq/L — ABNORMAL LOW (ref 135–145)

## 2010-09-18 LAB — GLUCOSE, CAPILLARY: Glucose-Capillary: 126 mg/dL — ABNORMAL HIGH (ref 70–99)

## 2010-09-23 NOTE — H&P (Signed)
  Kenneth Henson, COSTABILE              ACCOUNT NO.:  [id number]  MEDICAL RECORD NO.:  [id number]           PATIENT TYPE:  I  LOCATION:  [place]                         FACILITY:  [facility]  PHYSICIAN:  Almedia Balls. Ranell Patrick, M.D. DATE OF BIRTH:  [birth year]  DATE OF ADMISSION: DATE OF DISCHARGE:                             HISTORY & PHYSICAL   CHIEF COMPLAINTS:  Left shoulder pain.  HISTORY OF PRESENT ILLNESS:  The patient is a 69 year old male with worsening left shoulder pain secondary to osteoarthritis.  The patient elected to have a total shoulder arthroplasty by Dr. Malon Kindle to decrease pain and increase function.  PAST MEDICAL HISTORY:  Hypertension, peripheral vascular disease, hiatal hernia, diabetes type 2 and gout.  FAMILY MEDICAL HISTORY:  Coronary artery disease and congestive heart failure.  SOCIAL HISTORY:  Occasional alcohol use.  Does not smoke.  [name].  DRUG ALLERGIES:  None.  CURRENT MEDICATIONS: 1. Aspirin 81 mg daily. 2. Hydrochlorothiazide 12.5 mg daily. 3. Lisinopril 20 mg daily. 4. Glucophage 500 mg daily. 5. Zocor 20 mg daily. 6. Viagra 100 mg p.r.n.  REVIEW OF SYSTEMS:  Pain on range of motion of the left shoulder.  PHYSICAL EXAM:  VITAL SIGNS:  Pulse 63, respirations 16, blood pressure 112/72. GENERAL:  The patient is a healthy-appearing 70 year old male in no distress.  Pleasant mood and affect.  Oriented x3. HEAD AND NECK:  Examination of head and neck shows cranial nerves II-XII grossly intact.  Neck shows full range of motion without any tenderness. CHEST:  Active breath sounds bilaterally.  No wheezes, rhonchi or rales. HEART:  No murmur but did seem to have an irregular rate. EXTREMITIES:  Moderate tenderness in left shoulder with decreased range of motion and crepitus of the left shoulder range of motion. NEUROLOGICAL:  Intact. SKIN:  He has some mild pedal edema bilaterally but no rashes.  X-rays show end-stage  osteoarthritis of left shoulder.  IMPRESSION:  End-stage osteoarthritis, left shoulder.  PLAN OF ACTION:  Left total shoulder arthroplasty by Dr. Malon Kindle.     Kenneth Henson, P.A.   ______________________________ Almedia Balls. Ranell Patrick, M.D.    TBD/MEDQ  D:  09/07/2010  T:  09/08/2010  Job:  [id number]  Electronically Signed by Standley Dakins P.A. on 09/20/2010 07:37:26 AM Electronically Signed by Malon Kindle  on 09/23/2010 04:29:05 PM

## 2010-09-23 NOTE — Op Note (Signed)
NAMELORNE, WINKELS              ACCOUNT NO.:  [id number]  MEDICAL RECORD NO.:  [id number]           PATIENT TYPE:  I  LOCATION:  5017                         FACILITY:  [facility]  PHYSICIAN:  Almedia Balls. Ranell Patrick, M.D. DATE OF BIRTH:  [birth year]  DATE OF PROCEDURE:  09/16/2010 DATE OF DISCHARGE:                              OPERATIVE REPORT   PREOPERATIVE DIAGNOSES:  Left shoulder osteoarthritis, end-stage.  POSTOPERATIVE DIAGNOSES: 1. Left shoulder osteoarthritis, end-stage. 2. Rotator cuff insufficiency.  PROCEDURE PERFORMED:  Left shoulder reverse total shoulder arthroplasty.  SURGEON:  Almedia Balls. Ranell Patrick, MD  ASSISTANT:  Konrad Felix Dixon, PA-C  ANESTHESIA:  General anesthesia was used plus interscalene block.  ESTIMATED BLOOD LOSS:  400 mL.  FLUID REPLACEMENT:  1500 mL crystalloid.  URINE OUTPUT:  200 mL.  INSTRUMENT COUNT:  Correct.  COMPLICATIONS:  There were no complications.  ANTIBIOTICS:  Preoperative antibiotics were given.  INDICATIONS:  The patient is a 69 year old male with a history of worsening left shoulder pain and loss of function secondary to a worn out glenohumeral joint.  The patient has a diagnosis of osteoarthritis. The patient's function has been declining.  His pain is getting steadily worse.  The patient presents now for operative arthroplasty.  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  The patient presents now for operative arthroplasty.  Informed consent was obtained.  DESCRIPTION OF PROCEDURE:  After adequate level of anesthesia was achieved, the patient was positioned in modified beach-chair position. Left shoulder was sterilely prepped and draped in the usual manner. Deltopectoral approach was utilized starting with the #10 blade scalpel at the coracoid extending down to the anterior humerus dissecting down through subcutaneous  tissues using Bovie.  Cephalic vein was identified and taken laterally with the deltoid pectoralis taken medially.  The upper centimeter pectoralis released off the humerus.  Conjoined tendon retracted and taken medially.  The cobra retractors were placed.  We then released the subscapularis off the lesser tuberosity and we identified some calcific degeneration within the subscap tendon but the tendon itself looked to be in good condition.  We were able to get the capsule freed off the underside of the tendon in such that the tendon had a nice free balance to it.  The capsule was removed in its entirety. We located a very worn out joint consistent with osteoarthritis. Numerous loose bodies were removed.  Large osteophytes inferiorly were removed off the humerus.  We released the soft tissue off the humerus as we progressively externally rotated off the humeral neck.  Initial feeling was the cuff looked decent.  We then went ahead and made our cut for our standard total shoulder arthroplasty using the T-handled Crego elevator.  We went ahead and made our cut.  Once we made our cut, we could fully visualize the rotator cuff and there was essentially a thinning or near full-thickness tearing of the supraspinatus and infraspinatus tendons.  This was extremely thin.  I could pick my forefinger and thumb and essentially there was about a 1-2 mm thin in  left and it just looked very poor.  Extensive synovitis was noted. Extensive calcific inclusions were noted within the tendon itself and it appears that this calcific tendonitis had essentially completely destroyed his rotator cuff tendons on top.  Teres minor seen to be less involved in the back.  We went ahead.  At this point, I was worried that we place a total shoulder in him that would fail very quickly as his tendon quickly ruptured and then it is just not a sufficient tendon to advance or anything that we could really repair, so at this  point decision was made to proceed with a reverse total shoulder arthroplasty that does not rely on the rotator cuff.  We released the remaining rotator cuff tendon which was not much a millimeter or so on top around the teres minor and released that as well progressively externally rotating.  We then went ahead and placed our reamers down the humeral canal.  We were able to ream up to a size 12.  We then placed our 12 intramedullary proximal humerus resection guide, deltopectoral set on 10 degrees of retroversion.  We then resected it for that reverse delta extend shoulder prosthesis and then placed a metal shim on top of the cut humerus and retracted the humerus posteriorly.  The 360-degree capsular resection revealing a completely denuded glenoid but a nice concentric glenoid.  We removed the biceps tendon off the superior glenoid.  We then placed our central guide pin for our reaming for the Metaglene and then did a peripheral reaming by hand.  We then drilled our central hole, impacted the Metaglene in place with attention towards getting that inferior screw hole aligned up with the inferior scapular neck.  We then placed our proximal and distal screws which were locked with a 36 proximal and 48 distal and then 18-mm nonlocking screws anteriorly and posteriorly.  Once we had our screws locked in position and the locking mechanisms engaged, we went ahead and placed a 42 standard glenosphere and then screwed that into position.  We were happy with the location of that.  There was no bony impingement inferiorly. We then delivered our humerus back out of the wound, did our metaphyseal reaming from epiphysis to the left and then reamed that and placed our trial 12 stem with the epiphysis to proximal metaphyseal portion and screwed in position at 0 degrees.  We impacted that in place for a 10- degree retroverted humeral stem and then placed a +6 trial and reduced the shoulder.  There was a  little bit of shuck, not much but we placed a +9 and we were real happy with that.  Removed all trial components, selected the real hydroxyapatite Press-Fit stem, impacted that in position, and had great purchase.  Then selected the +9 trial and reduced the shoulder again with a nice pop and no shuck and no gapping with external rotation of the humerus, nice tension on the conjoint.  We selected the real 42 +9 poly, impacted that in position, and then reduced the shoulder.  Again trialed, no soft tissue or bony impingement.  With thorough irrigation, checked our axillary nerve which was intact and removed our subscap and then closed the deltopectoral interval after thorough irrigation with 0-Vicryl suture followed by 2-0 Vicryl subcutaneous closure and 4-0 Monocryl for skin.  Steri-Strips were applied followed by sterile dressing.  The patient tolerated the surgery well.     Almedia Balls. Ranell Patrick, M.D.    SRN/MEDQ  D:  09/16/2010  T:  09/16/2010  Job:  [id number]  Electronically Signed by Malon Kindle  on 09/23/2010 04:29:09 PM

## 2010-10-26 ENCOUNTER — Ambulatory Visit: Payer: Medicare Other | Admitting: Family Medicine

## 2010-10-31 ENCOUNTER — Encounter: Payer: Self-pay | Admitting: Family Medicine

## 2010-10-31 ENCOUNTER — Ambulatory Visit (INDEPENDENT_AMBULATORY_CARE_PROVIDER_SITE_OTHER): Payer: Medicare Other | Admitting: Family Medicine

## 2010-10-31 DIAGNOSIS — M7030 Other bursitis of elbow, unspecified elbow: Secondary | ICD-10-CM | POA: Insufficient documentation

## 2010-10-31 DIAGNOSIS — M7020 Olecranon bursitis, unspecified elbow: Secondary | ICD-10-CM

## 2010-10-31 NOTE — Progress Notes (Signed)
  Subjective:    Patient ID: Kenneth Henson, male    DOB: 09-19-1940, 70 y.o.   MRN: [id number]  HPI L Elbow lump- had surgery on 5/4 and has been wearing sling.  First noticed last week.  Not painful.  No redness, warmth or drainage.  On percocet and Ultram for post-op pain.  Also on Mobic.  Dr Ranell Patrick is Ortho.     Review of Systems For ROS see HPI     Objective:   Physical Exam  Vitals reviewed. Constitutional: He appears well-developed and well-nourished. No distress.  Musculoskeletal: He exhibits edema (large, fluctuant L elbow bursa.  non tender.). He exhibits no tenderness.          Assessment & Plan:

## 2010-10-31 NOTE — Patient Instructions (Signed)
Please call Dr Ranell Patrick and let him know you have fluid in your L elbow bursa (not painful) Continue the Mobic for pain and swelling ICE! If it's not too uncomfortable, try and leave your arm out of the sling to let the fluid drain Call with any questions or concerns Hang in there!!!

## 2010-11-06 NOTE — Assessment & Plan Note (Signed)
Pt w/ obvious fluid collection in L elbow bursa but area is not red or painful.  Recommended ice.  Continue NSAIDs.  F/u w/ ortho since it is in same arm as recent shoulder replacement.  Reviewed supportive care and red flags that should prompt return.  Pt expressed understanding and is in agreement w/ plan.

## 2010-12-02 NOTE — Discharge Summary (Signed)
  Kenneth Henson, Kenneth Henson              ACCOUNT NO.:  [id number]  MEDICAL RECORD NO.:  [id number]  LOCATION:  5017                         FACILITY:  [facility]  PHYSICIAN:  Almedia Balls. Ranell Patrick, M.D. DATE OF BIRTH:  10/26/1940  DATE OF ADMISSION:  09/16/2010 DATE OF DISCHARGE:  09/18/2010                              DISCHARGE SUMMARY   ADMISSION DIAGNOSIS:  Left shoulder end-stage osteoarthritis.  DISCHARGE DIAGNOSIS:  Left shoulder end-stage osteoarthritis with rotator cuff insufficiency.  BRIEF HISTORY:  The patient is a 70 year old male with worsening left shoulder pain and function due to left shoulder osteoarthritis at end- stage and rotator cuff insufficiency.  The patient elected to have surgery to decrease pain and increase function of left upper extremity.  PROCEDURE:  The patient had a left total shoulder arthroplasty reversed by Dr. Malon Kindle on Sep 16, 2010.  ASSISTANT:  Donnie Coffin. Dixon, PA-C  General anesthesia was used.  No complications.  HOSPITAL COURSE:  The patient admitted on Sep 16, 2010, for the above- stated procedure, which he tolerated well.  After adequate time in post anesthesia care unit, he was transferred to 5000.  On postop day #1, the patient complained of about minimal pain to the left shoulder, but overall fairly happy with his pain relief thus far.  He is able to work with physical therapy and occupational therapy quite well over the next couple of days.  This patient will be discharged home on Sep 18, 2010. His condition is stable.  His diet is regular.  FOLLOWUP:  The patient will follow back up with Dr. Malon Kindle in 2 weeks.  DISCHARGE MEDICATIONS: 1. Robaxin 500 mg p.o. q.6 h. 2. Percocet 5/325 one to two tablets q.4-6 h. p.r.n. pain.     Thomas B. Dixon, P.A.   ______________________________ Almedia Balls. Ranell Patrick, M.D.   TBD/MEDQ  D:  11/08/2010  T:  11/08/2010  Job:  [id number]  Electronically Signed by Standley Dakins P.A. on 11/22/2010  08:36:50 AM Electronically Signed by Malon Kindle  on 12/02/2010 12:09:07 AM

## 2010-12-29 ENCOUNTER — Other Ambulatory Visit: Payer: Self-pay | Admitting: Family Medicine

## 2010-12-29 DIAGNOSIS — N529 Male erectile dysfunction, unspecified: Secondary | ICD-10-CM

## 2010-12-29 MED ORDER — SILDENAFIL CITRATE 100 MG PO TABS: 100.0000 mg | ORAL_TABLET | ORAL | Status: DC | PRN

## 2010-12-29 MED ORDER — LISINOPRIL 40 MG PO TABS: 40.0000 mg | ORAL_TABLET | Freq: Every day | ORAL | Status: DC

## 2010-12-29 MED ORDER — METFORMIN HCL 850 MG PO TABS: 850.0000 mg | ORAL_TABLET | Freq: Every day | ORAL | Status: DC

## 2010-12-29 NOTE — Telephone Encounter (Signed)
DONE

## 2011-01-05 ENCOUNTER — Other Ambulatory Visit: Payer: Self-pay | Admitting: *Deleted

## 2011-01-05 DIAGNOSIS — N529 Male erectile dysfunction, unspecified: Secondary | ICD-10-CM

## 2011-01-05 MED ORDER — METFORMIN HCL 850 MG PO TABS: 850.0000 mg | ORAL_TABLET | Freq: Every day | ORAL | Status: DC

## 2011-01-05 MED ORDER — LISINOPRIL 40 MG PO TABS: 40.0000 mg | ORAL_TABLET | Freq: Every day | ORAL | Status: DC

## 2011-01-05 MED ORDER — SILDENAFIL CITRATE 100 MG PO TABS: 100.0000 mg | ORAL_TABLET | ORAL | Status: DC | PRN

## 2011-01-05 NOTE — Telephone Encounter (Signed)
Rx sent 

## 2011-01-10 ENCOUNTER — Other Ambulatory Visit: Payer: Self-pay

## 2011-01-10 DIAGNOSIS — N529 Male erectile dysfunction, unspecified: Secondary | ICD-10-CM

## 2011-01-10 NOTE — Telephone Encounter (Signed)
Kenneth Henson from [employer] called requesting Refills. I advised they were sent on 01/05/11--he stated he would take another look and call us back    KP

## 2011-02-03 ENCOUNTER — Other Ambulatory Visit: Payer: Self-pay | Admitting: Family Medicine

## 2011-02-06 MED ORDER — HYDROCHLOROTHIAZIDE 12.5 MG PO CAPS: 12.5000 mg | ORAL_CAPSULE | Freq: Every day | ORAL | Status: DC

## 2011-02-06 NOTE — Telephone Encounter (Signed)
DONE

## 2011-02-15 ENCOUNTER — Encounter: Payer: Self-pay | Admitting: Cardiovascular Disease

## 2011-02-15 ENCOUNTER — Ambulatory Visit (INDEPENDENT_AMBULATORY_CARE_PROVIDER_SITE_OTHER): Payer: Medicare Other | Admitting: Cardiovascular Disease

## 2011-02-15 VITALS — BP 133/72 | HR 64 | Ht 68.0 in | Wt 231.1 lb

## 2011-02-15 DIAGNOSIS — I70219 Atherosclerosis of native arteries of extremities with intermittent claudication, unspecified extremity: Secondary | ICD-10-CM

## 2011-02-15 DIAGNOSIS — I739 Peripheral vascular disease, unspecified: Secondary | ICD-10-CM

## 2011-02-15 NOTE — Progress Notes (Signed)
HPI:  This is a 70 year-old male presenting for follow-up of lower extremity PAD.  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 ABI's last year were 1.1 on the right and 0.94 on the left. 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 He does not have significant infrainguinal disease of either leg. He recently has developed pain and swelling in the left knee. Complains of pain with weight bearing or certain movements. His left calf claudication remains mild. He has not been doing much walking. No chest pain, dyspnea, or other complaints.  Outpatient Encounter Prescriptions as of 02/15/2011  Medication Sig Dispense Refill  . allopurinol (ZYLOPRIM) 300 MG tablet Take 300 mg by mouth daily.        Marland Kitchen aspirin 81 MG tablet Take 81 mg by mouth daily.        Marland Kitchen glucose blood test strip 1 each by Other route 2 (two) times daily. OneTouch Ultra       . hydrochlorothiazide (MICROZIDE) 12.5 MG capsule Take 1 capsule (12.5 mg total) by mouth daily.  90 capsule  0  . lisinopril (PRINIVIL,ZESTRIL) 40 MG tablet Take 1 tablet (40 mg total) by mouth daily.  90 tablet  0  . metFORMIN (GLUCOPHAGE) 850 MG tablet Take 1 tablet (850 mg total) by mouth daily.  90 tablet  0  . ONETOUCH DELICA LANCETS MISC by Does not apply route 2 (two) times daily.        . sildenafil (VIAGRA) 100 MG tablet Take 1 tablet (100 mg total) by mouth as needed for erectile dysfunction.  20 tablet  1  . simvastatin (ZOCOR) 20 MG tablet Take 20 mg by mouth daily.        . traMADol (ULTRAM) 50 MG tablet Take 50 mg by mouth every 6 (six) hours as needed.        Marland Kitchen DISCONTD: ALLOPURINOL PO Take by mouth daily. Pt does not know dosage      . DISCONTD: Oxycodone-Acetaminophen (PERCOCET PO) Take by mouth as directed. Pt does not know dosage.         No Known Allergies  Past Medical History  Diagnosis Date  . Hypertension   . Hyperlipidemia   . Claudication   . Neoplasm of skin    uncertain behavior  . Colon polyps   . Erectile dysfunction   . Abnormal involuntary movements   . Diabetes mellitus type II   . Arthritis   . Chicken pox     ROS: Negative except as per HPI  BP 133/72  Pulse 64  Ht 5\' 8"  (1.727 m)  Wt 231 lb 1.9 oz (104.835 kg)  BMI 35.14 kg/m2  PHYSICAL EXAM: Pt is alert and oriented, overweight male in NAD HEENT: normal Neck: JVP - normal, carotids 2+= without bruits Lungs: CTA bilaterally CV: RRR without murmur or gallop Abd: soft, NT, Positive BS, no hepatomegaly Ext: no C/C/E, fem pulses 2+ right and 1+ left, PT pulses 2+= bilaterally, DP pulses 1+ bilaterally Skin: warm/dry no rash  ASSESSMENT AND PLAN:

## 2011-02-15 NOTE — Assessment & Plan Note (Signed)
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 His current pain is clearly related to his knee joint and I asked him to follow-up with his orthopedist for this. I would like to repeat his ABI's and arterial waveforms. I'll plan on seeing him back in follow-up in one year. He remains on good medical therapy (ASA, ACE-I, statin).

## 2011-02-15 NOTE — Patient Instructions (Addendum)
Your physician has requested that you have an ankle brachial index (ABI). During this test an ultrasound and blood pressure cuff are used to evaluate the arteries that supply the arms and legs with blood. Allow thirty minutes for this exam. There are no restrictions or special instructions.  Your physician has requested that you have an aorta/iliac duplex. During this test, an ultrasound is used to evaluate the aorta. Allow 30 minutes for this exam. Do not eat after midnight the day before and avoid carbonated beverages  The current medical regimen is effective;  continue present plan and medications.  Follow up in 1 year with Dr Excell Seltzer.  You will receive a letter in the mail 2 months before you are due.  Please call us when you receive this letter to schedule your follow up appointment.

## 2011-02-21 ENCOUNTER — Ambulatory Visit: Payer: Medicare Other | Admitting: Cardiovascular Disease

## 2011-02-24 ENCOUNTER — Encounter: Payer: Self-pay | Admitting: Family Medicine

## 2011-02-24 ENCOUNTER — Ambulatory Visit (INDEPENDENT_AMBULATORY_CARE_PROVIDER_SITE_OTHER): Payer: Medicare Other | Admitting: Family Medicine

## 2011-02-24 DIAGNOSIS — E785 Hyperlipidemia, unspecified: Secondary | ICD-10-CM

## 2011-02-24 DIAGNOSIS — N529 Male erectile dysfunction, unspecified: Secondary | ICD-10-CM

## 2011-02-24 DIAGNOSIS — E119 Type 2 diabetes mellitus without complications: Secondary | ICD-10-CM

## 2011-02-24 DIAGNOSIS — I10 Essential (primary) hypertension: Secondary | ICD-10-CM

## 2011-02-24 DIAGNOSIS — M25569 Pain in unspecified knee: Secondary | ICD-10-CM | POA: Insufficient documentation

## 2011-02-24 DIAGNOSIS — Z23 Encounter for immunization: Secondary | ICD-10-CM

## 2011-02-24 LAB — LIPID PANEL
Cholesterol: 182 mg/dL (ref 0–200)
HDL: 74.6 mg/dL (ref >39.00)
Total CHOL/HDL Ratio: 2
Triglycerides: 78 mg/dL (ref 0.0–149.0)

## 2011-02-24 LAB — BASIC METABOLIC PANEL
CO2: 27 mEq/L (ref 19–32)
GFR: 112.69 mL/min (ref >60.00)
Glucose, Bld: 109 mg/dL — ABNORMAL HIGH (ref 70–99)
Potassium: 4.4 mEq/L (ref 3.5–5.1)
Sodium: 138 mEq/L (ref 135–145)

## 2011-02-24 LAB — HEPATIC FUNCTION PANEL
Albumin: 4.3 g/dL (ref 3.5–5.2)
Alkaline Phosphatase: 210 U/L — ABNORMAL HIGH (ref 39–117)

## 2011-02-24 MED ORDER — TRAMADOL HCL 50 MG PO TABS: 50.0000 mg | ORAL_TABLET | Freq: Four times a day (QID) | ORAL | Status: DC | PRN

## 2011-02-24 MED ORDER — TEMAZEPAM 30 MG PO CAPS: 30.0000 mg | ORAL_CAPSULE | Freq: Every evening | ORAL | Status: DC | PRN

## 2011-02-24 MED ORDER — SIMVASTATIN 20 MG PO TABS: 20.0000 mg | ORAL_TABLET | Freq: Every day | ORAL | Status: DC

## 2011-02-24 NOTE — Assessment & Plan Note (Signed)
Chronic problem.  Not taking simvastatin as directed.  Check labs.  Adjust meds prn.

## 2011-02-24 NOTE — Assessment & Plan Note (Signed)
Chronic problem.  BP slightly elevated today but not high enough to change meds.  Asymptomatic.  Will continue to follow.

## 2011-02-24 NOTE — Patient Instructions (Signed)
Schedule a follow up in 3 months to recheck the diabetes and blood pressure We'll notify you of your lab results ICE the knee Take the Tramadol as needed for pain If no improvement by Monday, call Ortho Continue all your meds Make sure you take the Zocor/Simvastatin every day! Call with any questions or concerns Happy Fall!!!

## 2011-02-24 NOTE — Progress Notes (Signed)
  Subjective:    Patient ID: Kenneth Henson, male    DOB: 09/25/1940, 70 y.o.   MRN: [id number]  HPI DM- chronic problem for pt, has not checked CBGs in a month or more.  On Metformin.  Denies symptomatic lows.  No CP, SOB, HAs, visual changes, edema.  Overdue on eye exam.  Hyperlipidemia- chronic problem, not taking simvastatin regularly, no N/V, abd pain, myalgia  HTN- chronic problem, on lisinopril and HCTZ.  Asymptomatic.    L knee pain- got cortisone shot on Wed from Dr Ranell Patrick.  Reports pain persists and knee remains swollen.   Review of Systems For ROS see HPI     Objective:   Physical Exam  Vitals reviewed. Constitutional: He is oriented to person, place, and time. He appears well-developed and well-nourished. No distress.  HENT:  Head: Normocephalic and atraumatic.  Eyes: Conjunctivae and EOM are normal. Pupils are equal, round, and reactive to light.  Neck: Normal range of motion. Neck supple. No thyromegaly present.  Cardiovascular: Normal rate, regular rhythm, normal heart sounds and intact distal pulses.   No murmur heard. Pulmonary/Chest: Effort normal and breath sounds normal. No respiratory distress.  Abdominal: Soft. Bowel sounds are normal. He exhibits no distension.  Musculoskeletal: He exhibits edema (bilateral +1 pitting).  Lymphadenopathy:    He has no cervical adenopathy.  Neurological: He is alert and oriented to person, place, and time. No cranial nerve deficit.  Skin: Skin is warm and dry.  Psychiatric: He has a normal mood and affect. His behavior is normal.          Assessment & Plan:

## 2011-02-24 NOTE — Assessment & Plan Note (Signed)
Taking Metformin regularly.  Not checking CBGs.  Overdue on eye exam- needs referral.  Check labs.  Adjust meds prn.

## 2011-02-24 NOTE — Assessment & Plan Note (Signed)
Encouraged him to alternate heat and ice to improve swelling.  Refill given on Ultram.  If no improvement by Monday needs to call ortho.  Pt expressed understanding and is in agreement w/ plan.

## 2011-03-07 ENCOUNTER — Other Ambulatory Visit: Payer: Self-pay | Admitting: Family Medicine

## 2011-03-07 DIAGNOSIS — D7289 Other specified disorders of white blood cells: Secondary | ICD-10-CM

## 2011-03-07 DIAGNOSIS — Z0000 Encounter for general adult medical examination without abnormal findings: Secondary | ICD-10-CM

## 2011-03-08 ENCOUNTER — Telehealth: Payer: Self-pay | Admitting: *Deleted

## 2011-03-08 ENCOUNTER — Other Ambulatory Visit: Payer: Self-pay | Admitting: Family Medicine

## 2011-03-08 ENCOUNTER — Other Ambulatory Visit (INDEPENDENT_AMBULATORY_CARE_PROVIDER_SITE_OTHER): Payer: Medicare Other

## 2011-03-08 DIAGNOSIS — R7989 Other specified abnormal findings of blood chemistry: Secondary | ICD-10-CM

## 2011-03-08 NOTE — Telephone Encounter (Signed)
Pt came in office for appt and advised that he had not received Temazepam, contacted [facility]. Was advised by rep that the medication had been shipped on 02-28-11. I called pt and advised the shippment is still in transit. Pt understood

## 2011-03-08 NOTE — Progress Notes (Signed)
Labs only

## 2011-03-23 ENCOUNTER — Encounter (INDEPENDENT_AMBULATORY_CARE_PROVIDER_SITE_OTHER): Payer: Medicare Other | Admitting: Cardiology

## 2011-03-23 DIAGNOSIS — I70219 Atherosclerosis of native arteries of extremities with intermittent claudication, unspecified extremity: Secondary | ICD-10-CM

## 2011-03-23 DIAGNOSIS — I700 Atherosclerosis of aorta: Secondary | ICD-10-CM

## 2011-03-23 DIAGNOSIS — E1159 Type 2 diabetes mellitus with other circulatory complications: Secondary | ICD-10-CM

## 2011-03-23 DIAGNOSIS — I739 Peripheral vascular disease, unspecified: Secondary | ICD-10-CM

## 2011-04-04 ENCOUNTER — Encounter: Payer: Self-pay | Admitting: Cardiovascular Disease

## 2011-04-04 NOTE — Progress Notes (Signed)
Patient ID: Kenneth Henson, male   DOB: [birth year], 70 y.o.   MRN: [id number]  Received a request from Dr Ranell Patrick with [facility] for cardiac clearance of [name]. Dr Jens Som is his primary cardiologist - I have seen him for eval of PAD. He had a normal stress test in August 2011, but has not had cardiac eval in several months. Will route to Dr Jens Som but suspect he will need office visit prior to surgery.

## 2011-04-11 ENCOUNTER — Ambulatory Visit (INDEPENDENT_AMBULATORY_CARE_PROVIDER_SITE_OTHER): Payer: Medicare Other | Admitting: Cardiology

## 2011-04-11 ENCOUNTER — Encounter: Payer: Self-pay | Admitting: Cardiology

## 2011-04-11 DIAGNOSIS — Z01810 Encounter for preprocedural cardiovascular examination: Secondary | ICD-10-CM | POA: Insufficient documentation

## 2011-04-11 DIAGNOSIS — E785 Hyperlipidemia, unspecified: Secondary | ICD-10-CM

## 2011-04-11 DIAGNOSIS — I10 Essential (primary) hypertension: Secondary | ICD-10-CM

## 2011-04-11 DIAGNOSIS — I739 Peripheral vascular disease, unspecified: Secondary | ICD-10-CM

## 2011-04-11 NOTE — Assessment & Plan Note (Signed)
Now followed by Dr. Excell Seltzer. He discontinued his tobacco use. Continue aspirin and statin.

## 2011-04-11 NOTE — Assessment & Plan Note (Signed)
Patient had Myoview approximately one year ago that was negative. No recurrent symptoms. Surgery is not high risk. He may proceed with surgery without further cardiovascular evaluation.

## 2011-04-11 NOTE — Progress Notes (Signed)
HPI: Pleasant male I initially saw in July of 2011 for evaluation of premature atrial contractions, chest pain and abnormal electrocardiogram. Myoview 8/11 revealed an EF of 64% and normal perfusion. 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 I last saw him in Sept 2011. Since then he denies dyspnea or chest pain. However he is having significant pain in his left knee and will require arthroscopic surgery. We were asked to evaluate preoperatively.  Current Outpatient Prescriptions  Medication Sig Dispense Refill  . allopurinol (ZYLOPRIM) 300 MG tablet Take 300 mg by mouth daily.        Marland Kitchen aspirin 81 MG tablet Take 81 mg by mouth daily.        Marland Kitchen glucose blood test strip 1 each by Other route 2 (two) times daily. OneTouch Ultra       . hydrochlorothiazide (MICROZIDE) 12.5 MG capsule Take 1 capsule (12.5 mg total) by mouth daily.  90 capsule  0  . lisinopril (PRINIVIL,ZESTRIL) 40 MG tablet Take 1 tablet (40 mg total) by mouth daily.  90 tablet  0  . metFORMIN (GLUCOPHAGE) 850 MG tablet Take 1 tablet (850 mg total) by mouth daily.  90 tablet  0  . ONETOUCH DELICA LANCETS MISC by Does not apply route 2 (two) times daily.        . sildenafil (VIAGRA) 100 MG tablet Take 1 tablet (100 mg total) by mouth as needed for erectile dysfunction.  20 tablet  1  . simvastatin (ZOCOR) 20 MG tablet Take 1 tablet (20 mg total) by mouth daily.  90 tablet  3  . temazepam (RESTORIL) 30 MG capsule Take 1 capsule (30 mg total) by mouth at bedtime as needed.  90 capsule  1  . traMADol (ULTRAM) 50 MG tablet Take 1 tablet (50 mg total) by mouth every 6 (six) hours as needed.  90 tablet  0     Past Medical History  Diagnosis Date  . Hypertension   . Hyperlipidemia   . Claudication   . Neoplasm of skin     uncertain behavior  . Colon polyps   . Erectile dysfunction   . Abnormal involuntary movements   . Diabetes mellitus type  II   . Arthritis   . Chicken pox     Past Surgical History  Procedure Date  . Hand surgery   . Knee surgery   . Neck surgery   . Carpal tunnel release     right    History   Social History  . Marital Status: Single    Spouse Name: N/A    Number of Children: N/A  . Years of Education: N/A   Occupational History  . Not on file.   Social History Main Topics  . Smoking status: Former Smoker    Quit date: 05/15/2009  . Smokeless tobacco: Not on file  . Alcohol Use: Yes     social  . Drug Use: No  . Sexually Active:    Other Topics Concern  . Not on file   Social History Narrative  . No narrative on file    ROS: significant left knee arthralgias but no fevers or chills, productive cough, hemoptysis, dysphasia, odynophagia, melena, hematochezia, dysuria, hematuria, rash, seizure activity, orthopnea, PND, pedal edema, claudication. Remaining systems are negative.  Physical Exam: Well-developed well-nourished in no acute distress.  Skin is warm and dry.  HEENT is normal.  Neck is  supple. No thyromegaly.  Chest is clear to auscultation with normal expansion.  Cardiovascular exam is regular rate and rhythm.  Abdominal exam nontender or distended. No masses palpated. Extremities show no edema. neuro grossly intact  ECG 02/16/11 - sinus with no ST changes.

## 2011-04-11 NOTE — Assessment & Plan Note (Signed)
Blood pressure controlled. Continue present medications. 

## 2011-04-11 NOTE — Assessment & Plan Note (Signed)
Management per primary care. 

## 2011-05-22 ENCOUNTER — Encounter: Payer: Self-pay | Admitting: Family Medicine

## 2011-05-22 ENCOUNTER — Ambulatory Visit (INDEPENDENT_AMBULATORY_CARE_PROVIDER_SITE_OTHER): Payer: Medicare Other | Admitting: Family Medicine

## 2011-05-22 DIAGNOSIS — M6530 Trigger finger, unspecified finger: Secondary | ICD-10-CM

## 2011-05-22 DIAGNOSIS — J40 Bronchitis, not specified as acute or chronic: Secondary | ICD-10-CM | POA: Insufficient documentation

## 2011-05-22 MED ORDER — TRIAMCINOLONE ACETONIDE 0.1 % EX OINT
TOPICAL_OINTMENT | Freq: Two times a day (BID) | CUTANEOUS | Status: AC
Start: 2011-05-22 — End: 2012-05-21

## 2011-05-22 MED ORDER — BENZONATATE 200 MG PO CAPS: 200.0000 mg | ORAL_CAPSULE | Freq: Three times a day (TID) | ORAL | Status: AC | PRN

## 2011-05-22 MED ORDER — AZITHROMYCIN 250 MG PO TABS: 250.0000 mg | ORAL_TABLET | Freq: Every day | ORAL | Status: AC

## 2011-05-22 MED ORDER — TRIAMCINOLONE ACETONIDE 0.1 % EX OINT
TOPICAL_OINTMENT | Freq: Two times a day (BID) | CUTANEOUS | Status: DC
Start: 2011-05-22 — End: 2011-05-22

## 2011-05-22 NOTE — Progress Notes (Signed)
  Subjective:    Patient ID: Kenneth Henson, male    DOB: [birth year], 71 y.o.   MRN: [id number]  HPI Trigger fingers- sxs have been occuring intermittently 'for years'.  Over the last 2 weeks, sxs have increased to nearly daily.  R hand, first 3 fingers- will have to manually straighten.  Denies pain but pt finds this worrisome.  Has never seen hand surgeon.  URI- sxs started 1/1.  + nasal congestion, cough- productive of 'yellowish' phlegm.  Denies facial pain, ear pain.  Low grade temps.  + sick contacts.   Review of Systems For ROS see HPI     Objective:   Physical Exam  Vitals reviewed. Constitutional: He appears well-developed and well-nourished. No distress.  HENT:  Head: Normocephalic and atraumatic.  Right Ear: Tympanic membrane normal.  Left Ear: Tympanic membrane normal.  Nose: No mucosal edema or rhinorrhea. Right sinus exhibits no maxillary sinus tenderness and no frontal sinus tenderness. Left sinus exhibits no maxillary sinus tenderness and no frontal sinus tenderness.  Mouth/Throat: Mucous membranes are normal. No oropharyngeal exudate, posterior oropharyngeal edema or posterior oropharyngeal erythema.  Eyes: Conjunctivae and EOM are normal. Pupils are equal, round, and reactive to light.  Neck: Normal range of motion. Neck supple.  Cardiovascular: Normal rate, regular rhythm and normal heart sounds.   Pulmonary/Chest: Effort normal and breath sounds normal. No respiratory distress. He has no wheezes.       + hacking cough  Musculoskeletal: Normal range of motion. He exhibits no edema and no tenderness.       No triggering seen in office today, no nodularity palpated.  Lymphadenopathy:    He has no cervical adenopathy.  Skin: Skin is warm and dry.          Assessment & Plan:

## 2011-05-22 NOTE — Patient Instructions (Signed)
This is a bronchitis Use the Azithromycin as directed Add Mucinex to thin your congestion Cough meds as needed Drink plenty of fluids REST! Someone will call you with your hand surgeon appt Call with any questions or concerns Hang in there!!!

## 2011-05-25 NOTE — Assessment & Plan Note (Signed)
New.  Start abx.  Cough meds.  Reviewed supportive care and red flags that should prompt return.  Pt expressed understanding and is in agreement w/ plan.

## 2011-05-25 NOTE — Assessment & Plan Note (Signed)
New.  Pt will need to see hand specialist to review tx options.  Referral sent.  Pt expressed understanding and is in agreement w/ plan.

## 2011-05-29 ENCOUNTER — Telehealth: Payer: Self-pay | Admitting: Family Medicine

## 2011-05-29 MED ORDER — METFORMIN HCL 850 MG PO TABS: 850.0000 mg | ORAL_TABLET | Freq: Every day | ORAL | Status: DC

## 2011-05-29 MED ORDER — HYDROCHLOROTHIAZIDE 12.5 MG PO CAPS: 12.5000 mg | ORAL_CAPSULE | Freq: Every day | ORAL | Status: DC

## 2011-05-29 MED ORDER — TRAMADOL HCL 50 MG PO TABS: 50.0000 mg | ORAL_TABLET | Freq: Four times a day (QID) | ORAL | Status: DC | PRN

## 2011-05-29 MED ORDER — LISINOPRIL 40 MG PO TABS: 40.0000 mg | ORAL_TABLET | Freq: Every day | ORAL | Status: DC

## 2011-05-29 NOTE — Telephone Encounter (Signed)
Ok for #90, 3 refills on each

## 2011-05-29 NOTE — Telephone Encounter (Signed)
Patient states that [facility] but they told him to call here.   Refills on lisinopril, metformin, tramadol, and hydrochlorothiazide

## 2011-05-29 NOTE — Telephone Encounter (Signed)
rx sent to pharmacy by e-script  

## 2011-05-29 NOTE — Telephone Encounter (Signed)
Last OV 05-22-11 for bronchitis last refill 02-24-11 #90 no refills for tramadol

## 2011-07-06 ENCOUNTER — Other Ambulatory Visit: Payer: Self-pay | Admitting: *Deleted

## 2011-07-06 MED ORDER — GLUCOSE BLOOD VI STRP: ORAL_STRIP | Status: DC

## 2011-07-06 NOTE — Telephone Encounter (Signed)
Please send his test strip refill x6

## 2011-07-06 NOTE — Telephone Encounter (Signed)
[facility] Triage Call Report Triage Record Num: [id number] Operator: Revonda Humphrey Patient Name: Kenneth Henson Call Date & Time: 07/06/2011 11:56:12AM Patient Phone: [phone] PCP: Lezlie Octave Patient Gender: Male PCP Fax : [phone] Patient DOB: 1940-06-10 Practice Name: [facility] Reason for Call: Caller: Thorn/Patient; PCP: Sheliah Hatch.; CB#: [phone]; ; ; Call regarding Blood Sugar Is 210 and Wants To Know What To DO; Usually takes FBS and usually 105-106. Today had breakfast of banana, sweet roll, tea, 2 cutie oranges about 8:30am. Took Blood suger at 10:45am and was 210 - never had blood sugar that high. Rechecked BS at 11:15am after drinking OJ and reading was 188. Feeling well now but concern about BS. Has not been monitoring BS on regular basis. Last A1C on Oct was 5.8 per record that he has. Has added Sensa to foods on 2/12 to help loose weight and has lost 2 Lbs. Will monitor blood sugars more regularily and follow up in office for further instructions. Protocol(s) Used: Diabetes: Control Problems Recommended Outcome per Protocol: See Provider within 24 hours Reason for Outcome: All other situations Care Advice: Call provider within 24 hours if blood sugar is 250 mg/dl two times in a row, or if most blood sugars are 250 mg/dl for more than 3 days. Call provider immediately if urine ketones are large (3+ to 4+).

## 2011-07-06 NOTE — Telephone Encounter (Signed)
rx sent to pharmacy by e-script for [facility], called pt to advise [facility], pt asked if [facility], I advised that I would send those today, also instructed pt the following:   based on his morning intake this sugar is not unreasonable- the fruit, the juice, the roll, and the tea all have sugar.      Pt understood and will note this for future reference.

## 2011-07-06 NOTE — Telephone Encounter (Signed)
Please reassure pt that based on his morning intake this sugar is not unreasonable- the fruit, the juice, the roll, and the tea all have sugar.

## 2011-07-06 NOTE — Telephone Encounter (Signed)
No scheduled appt noted in chart at current time

## 2011-07-06 NOTE — Telephone Encounter (Signed)
Pt called requesting a refill for glucose test strips [facility].

## 2011-07-10 ENCOUNTER — Telehealth: Payer: Self-pay | Admitting: Family Medicine

## 2011-07-10 MED ORDER — GLUCOSE BLOOD VI STRP: ORAL_STRIP | Status: DC

## 2011-07-10 NOTE — Telephone Encounter (Signed)
rx sent to pharmacy by e-script  

## 2011-07-28 ENCOUNTER — Telehealth: Payer: Self-pay | Admitting: *Deleted

## 2011-07-28 NOTE — Telephone Encounter (Signed)
Called pt to ask what his concern is today, advised that he needs a new rx [facility], pt states that this is a prior authorization request, advised that the prior authorizations can be a lengthy process but that we will get this set up for him as soon as possible, pt understood.

## 2011-07-28 NOTE — Telephone Encounter (Signed)
Pt left msg on triage vmail stating he would like for you to return his call. No other information was provided.   Best contact #: [phone]

## 2011-08-03 MED ORDER — TEMAZEPAM 30 MG PO CAPS: 30.0000 mg | ORAL_CAPSULE | Freq: Every evening | ORAL | Status: DC | PRN

## 2011-08-03 NOTE — Telephone Encounter (Signed)
Spoke with insurance Pt just needs a new Rx sent in to the pharmacy, Last filled 02-24-12 #90 1, last OV 05-22-11

## 2011-08-03 NOTE — Telephone Encounter (Signed)
Refill x1 

## 2011-08-03 NOTE — Telephone Encounter (Signed)
Rx sent 

## 2011-11-07 ENCOUNTER — Encounter: Payer: Self-pay | Admitting: Family Medicine

## 2011-11-07 ENCOUNTER — Ambulatory Visit (INDEPENDENT_AMBULATORY_CARE_PROVIDER_SITE_OTHER): Payer: Medicare Other | Admitting: Family Medicine

## 2011-11-07 VITALS — BP 124/82 | HR 71 | Temp 98.7°F | Ht 68.75 in | Wt 235.4 lb

## 2011-11-07 DIAGNOSIS — Z860100 Personal history of colon polyps, unspecified: Secondary | ICD-10-CM

## 2011-11-07 DIAGNOSIS — E785 Hyperlipidemia, unspecified: Secondary | ICD-10-CM

## 2011-11-07 DIAGNOSIS — D239 Other benign neoplasm of skin, unspecified: Secondary | ICD-10-CM

## 2011-11-07 DIAGNOSIS — I10 Essential (primary) hypertension: Secondary | ICD-10-CM

## 2011-11-07 DIAGNOSIS — E119 Type 2 diabetes mellitus without complications: Secondary | ICD-10-CM

## 2011-11-07 DIAGNOSIS — N529 Male erectile dysfunction, unspecified: Secondary | ICD-10-CM

## 2011-11-07 DIAGNOSIS — D229 Melanocytic nevi, unspecified: Secondary | ICD-10-CM

## 2011-11-07 MED ORDER — TADALAFIL 5 MG PO TABS: ORAL_TABLET | ORAL | Status: DC

## 2011-11-07 NOTE — Patient Instructions (Addendum)
We'll call you with your GI and dermatology appts Start the Cialis daily Call with any questions or concerns So glad everything checked out ok at the [place] Have a great trip!!!

## 2011-11-07 NOTE — Assessment & Plan Note (Signed)
New.  Refer to derm b/c pt feels this is increasing in size and is much more noticeable today.

## 2011-11-07 NOTE — Assessment & Plan Note (Signed)
Chronic problem.  Well controlled.  Asymptomatic. 

## 2011-11-07 NOTE — Assessment & Plan Note (Signed)
Chronic problem.  Pt would prefer to switch to once daily Cialis.  Script and coupon given.

## 2011-11-07 NOTE — Assessment & Plan Note (Signed)
Chronic problem.  Tolerating statin w/out difficulty.  Just had labs done at [place].

## 2011-11-07 NOTE — Assessment & Plan Note (Signed)
Chronic problem.  Following [facility].  Reports recent lab work 'was good'.  Still on same meds.  UTD on eye exam.

## 2011-11-07 NOTE — Progress Notes (Signed)
  Subjective:    Patient ID: Kenneth Henson, male    DOB: [birth year], 71 y.o.   MRN: [id number]  HPI Hx of colon polyps- was told by GI doctor in [place] to have colonoscopy q3 yrs.  Is due this year.  Needs referral  DM- chronic problem.  Last A1C checked in Oct.  Had A1C checked in April at [place].  Still on Metformin 850mg  once daily.  UTD on eye exam.  Denies symptomatic lows- no dizziness, N/V, no numbness or tingling of hands or feet.  Hyperlipidemia- chronic problem, on Zocor.  Had labs done at [place] in April.  No changes.  HTN- chronic problem, excellent control on Lisinopril and HCTZ.  No CP, SOB, HAs, visual changes, edema.  ED- previously on Viagra.  Would like to switch to once daily Cialis   Review of Systems For ROS see HPI     Objective:   Physical Exam  Vitals reviewed. Constitutional: He is oriented to person, place, and time. He appears well-developed and well-nourished. No distress.  HENT:  Head: Normocephalic and atraumatic.  Eyes: Conjunctivae and EOM are normal. Pupils are equal, round, and reactive to light.  Neck: Normal range of motion. Neck supple. No thyromegaly present.  Cardiovascular: Normal rate, regular rhythm, normal heart sounds and intact distal pulses.   No murmur heard. Pulmonary/Chest: Effort normal and breath sounds normal. No respiratory distress.  Abdominal: Soft. Bowel sounds are normal. He exhibits no distension.  Musculoskeletal: He exhibits no edema.  Lymphadenopathy:    He has no cervical adenopathy.  Neurological: He is alert and oriented to person, place, and time. No cranial nerve deficit.  Skin: Skin is warm and dry.       Atypical nevus on R forearm  Psychiatric: He has a normal mood and affect. His behavior is normal.          Assessment & Plan:

## 2011-11-07 NOTE — Assessment & Plan Note (Signed)
Pt due for repeat colonoscopy this year.  Refer to GI

## 2011-11-08 ENCOUNTER — Other Ambulatory Visit: Payer: Self-pay | Admitting: Family Medicine

## 2011-11-08 DIAGNOSIS — N529 Male erectile dysfunction, unspecified: Secondary | ICD-10-CM

## 2011-11-08 MED ORDER — TADALAFIL 5 MG PO TABS: ORAL_TABLET | ORAL | Status: DC

## 2011-11-08 NOTE — Telephone Encounter (Signed)
[id number] [place], [place] - [address]

## 2011-11-08 NOTE — Telephone Encounter (Signed)
rx sent to pharmacy by e-script per MD Beverely Low instructions

## 2011-12-07 ENCOUNTER — Ambulatory Visit (AMBULATORY_SURGERY_CENTER): Payer: Medicare Other | Admitting: *Deleted

## 2011-12-07 ENCOUNTER — Telehealth: Payer: Self-pay | Admitting: *Deleted

## 2011-12-07 VITALS — Ht 68.5 in | Wt 224.7 lb

## 2011-12-07 DIAGNOSIS — Z1211 Encounter for screening for malignant neoplasm of colon: Secondary | ICD-10-CM

## 2011-12-07 MED ORDER — MOVIPREP 100 G PO SOLR: ORAL | Status: DC

## 2011-12-07 NOTE — Telephone Encounter (Signed)
Pt faxed procedure report from colonoscopy in [place] in 2010 to our office.  No pathology report.  Release of information form given to Selinda Michaels, RN.

## 2011-12-07 NOTE — Progress Notes (Signed)
Pt had colonoscopy 3 years ago in [place].  Pt cannot remember name of MD; he will check his records at home and call us back.

## 2011-12-08 NOTE — Telephone Encounter (Signed)
Form faxed 12/07/11

## 2011-12-13 ENCOUNTER — Telehealth: Payer: Self-pay | Admitting: Gastroenterology

## 2011-12-13 NOTE — Telephone Encounter (Signed)
Forward 15 pages from [facility] to Dr. Melvia Heaps for review on 12-12-11 ym

## 2011-12-15 ENCOUNTER — Telehealth: Payer: Self-pay | Admitting: *Deleted

## 2011-12-15 NOTE — Telephone Encounter (Signed)
Ok to continue with colon-Dr Arlyce Dice Reviewed records Paperwork on from of [facility] chart L/M for pt to continue with colon

## 2011-12-21 ENCOUNTER — Ambulatory Visit (AMBULATORY_SURGERY_CENTER): Payer: Medicare Other | Admitting: Gastroenterology

## 2011-12-21 ENCOUNTER — Encounter: Payer: Self-pay | Admitting: Gastroenterology

## 2011-12-21 VITALS — BP 123/75 | HR 63 | Temp 97.4°F | Resp 20 | Ht 68.0 in | Wt 224.0 lb

## 2011-12-21 DIAGNOSIS — K5730 Diverticulosis of large intestine without perforation or abscess without bleeding: Secondary | ICD-10-CM

## 2011-12-21 DIAGNOSIS — Z1211 Encounter for screening for malignant neoplasm of colon: Secondary | ICD-10-CM

## 2011-12-21 DIAGNOSIS — Z86010 Personal history of colonic polyps: Secondary | ICD-10-CM

## 2011-12-21 LAB — GLUCOSE, CAPILLARY: Glucose-Capillary: 99 mg/dL (ref 70–99)

## 2011-12-21 MED ORDER — SODIUM CHLORIDE 0.9 % IV SOLN: 500.0000 mL | INTRAVENOUS | Status: DC

## 2011-12-21 NOTE — Patient Instructions (Signed)
YOU HAD AN ENDOSCOPIC PROCEDURE TODAY AT THE [facility]: Refer to the procedure report that was given to you for any specific questions about what was found during the examination.  If the procedure report does not answer your questions, please call your gastroenterologist to clarify.  If you requested that your care partner not be given the details of your procedure findings, then the procedure report has been included in a sealed envelope for you to review at your convenience later.  YOU SHOULD EXPECT: Some feelings of bloating in the abdomen. Passage of more gas than usual.  Walking can help get rid of the air that was put into your GI tract during the procedure and reduce the bloating. If you had a lower endoscopy (such as a colonoscopy or flexible sigmoidoscopy) you may notice spotting of blood in your stool or on the toilet paper. If you underwent a bowel prep for your procedure, then you may not have a normal bowel movement for a few days.  DIET: Your first meal following the procedure should be a light meal and then it is ok to progress to your normal diet.  A half-sandwich or bowl of soup is an example of a good first meal.  Heavy or fried foods are harder to digest and may make you feel nauseous or bloated.  Likewise meals heavy in dairy and vegetables can cause extra gas to form and this can also increase the bloating.  Drink plenty of fluids but you should avoid alcoholic beverages for 24 hours.  ACTIVITY: Your care partner should take you home directly after the procedure.  You should plan to take it easy, moving slowly for the rest of the day.  You can resume normal activity the day after the procedure however you should NOT DRIVE or use heavy machinery for 24 hours (because of the sedation medicines used during the test).    SYMPTOMS TO REPORT IMMEDIATELY: A gastroenterologist can be reached at any hour.  During normal business hours, 8:30 AM to 5:00 PM Monday through Friday,  call [phone].  After hours and on weekends, please call the GI answering service at [phone] who will take a message and have the physician on call contact you.   Following lower endoscopy (colonoscopy or flexible sigmoidoscopy):  Excessive amounts of blood in the stool  Significant tenderness or worsening of abdominal pains  Swelling of the abdomen that is new, acute  Fever of 100F or higher  Following upper endoscopy (EGD)  Vomiting of blood or coffee ground material  New chest pain or pain under the shoulder blades  Painful or persistently difficult swallowing  New shortness of breath  Fever of 100F or higher  Black, tarry-looking stools  FOLLOW UP: If any biopsies were taken you will be contacted by phone or by letter within the next 1-3 weeks.  Call your gastroenterologist if you have not heard about the biopsies in 3 weeks.  Our staff will call the home number listed on your records the next business day following your procedure to check on you and address any questions or concerns that you may have at that time regarding the information given to you following your procedure. This is a courtesy call and so if there is no answer at the home number and we have not heard from you through the emergency physician on call, we will assume that you have returned to your regular daily activities without incident.  SIGNATURES/CONFIDENTIALITY: You and/or your care   partner have signed paperwork which will be entered into your electronic medical record.  These signatures attest to the fact that that the information above on your After Visit Summary has been reviewed and is understood.  Full responsibility of the confidentiality of this discharge information lies with you and/or your care-partner.  

## 2011-12-21 NOTE — Op Note (Signed)
[facility] [address] [place], [place]  [address]  COLONOSCOPY PROCEDURE REPORT  PATIENT:  Kenneth Henson, Kenneth Henson  MR#:  [id number] BIRTHDATE:  Oct 11, 1940, 70 yrs. old  GENDER:  male ENDOSCOPIST:  Barbette Hair. Arlyce Dice, MD REF. BY:  Neena Rhymes, M.D. PROCEDURE DATE:  12/21/2011 PROCEDURE:  Diagnostic Colonoscopy ASA CLASS:  Class II INDICATIONS:  Screening, history of pre-cancerous (adenomatous) colon polyps Polyps 2007, 2010 and prior colonoscopies MEDICATIONS:   MAC sedation, administered by CRNA propofol 220mg IV  DESCRIPTION OF PROCEDURE:   After the risks benefits and alternatives of the procedure were thoroughly explained, informed consent was obtained.  Digital rectal exam was performed and revealed no abnormalities.   The LB [id number] endoscope was introduced through the anus and advanced to the cecum, which was identified by the ileocecal valve, without limitations.  The quality of the prep was good, using MoviPrep.  The instrument was then slowly withdrawn as the colon was fully examined. <<PROCEDUREIMAGES>>  FINDINGS:  Moderate diverticulosis was found (see image4). Sigmoid to ascending colon  Two polyps were found in the rectum (see image6). 2 diminutive hyperplastic appearing polyps at dentate line  Internal Hemorrhoids were found.  This was otherwise a normal examination of the colon (see image2).   Retroflexed views in the rectum revealed no abnormalities.    The time to cecum = 1) 6.75  minutes. The scope was then withdrawn in  1) 7.75  minutes from the cecum and the procedure completed. COMPLICATIONS:  None ENDOSCOPIC IMPRESSION: 1) Moderate diverticulosis 2) Two polyps in the rectum 3) Internal hemorrhoids 4) Otherwise normal examination RECOMMENDATIONS: 1) Colonoscopy 5 years REPEAT EXAM:  In 5 year(s) for Colonoscopy.  ______________________________ Barbette Hair. Arlyce Dice, MD  CC:  n. eSIGNED:   Barbette Hair. Leba Tibbitts at 12/21/2011 09:42  AM  Wandra Arthurs, [phone]

## 2011-12-21 NOTE — Progress Notes (Signed)
Patient did not experience any of the following events: a burn prior to discharge; a fall within the facility; wrong site/side/patient/procedure/implant event; or a hospital transfer or hospital admission upon discharge from the facility. (G8907) Patient did not have preoperative order for IV antibiotic SSI prophylaxis. (G8918)  

## 2011-12-21 NOTE — Progress Notes (Signed)
Propofol given and oxygen managed per K Rogers CRNA 

## 2011-12-22 ENCOUNTER — Telehealth: Payer: Self-pay | Admitting: *Deleted

## 2011-12-22 NOTE — Telephone Encounter (Signed)
  Follow up Call-  Call back number 12/21/2011  Post procedure Call Back phone  # [phone]  Permission to leave phone message Yes     Patient questions:  Do you have a fever, pain , or abdominal swelling? no Pain Score  0 *  Have you tolerated food without any problems? yes  Have you been able to return to your normal activities? yes  Do you have any questions about your discharge instructions: Diet   no Medications  no Follow up visit  no  Do you have questions or concerns about your Care? no  Actions: * If pain score is 4 or above: No action needed, pain <4.

## 2012-01-04 ENCOUNTER — Telehealth: Payer: Self-pay | Admitting: Family Medicine

## 2012-01-04 ENCOUNTER — Ambulatory Visit (INDEPENDENT_AMBULATORY_CARE_PROVIDER_SITE_OTHER): Payer: Medicare Other | Admitting: Family Medicine

## 2012-01-04 ENCOUNTER — Encounter: Payer: Self-pay | Admitting: Family Medicine

## 2012-01-04 VITALS — BP 138/80 | HR 68 | Temp 98.5°F | Ht 67.75 in | Wt 233.5 lb

## 2012-01-04 DIAGNOSIS — R51 Headache: Secondary | ICD-10-CM

## 2012-01-04 NOTE — Patient Instructions (Addendum)
This appears to be a sinus headache Tylenol or ibuprofen would be appropriate It is not related to your sugar- thank goodness! Call with any questions or concerns Hang in there!

## 2012-01-04 NOTE — Telephone Encounter (Signed)
Caller: [place]arez/Patient; Patient Name: Kenneth Henson; PCP: Sheliah Hatch.; Best Callback Phone Number: [phone].  Called re L frontal headache with elevated blood sugar.  Random blood sugar 177 at 0900;  Ate frosted flakes at 0730.  Headache began 01/04/12; rated 5/10. Afebrile. No way to check BP at home. Moving head increases headache pain. Advised to see MD wihtin 72 hours for headache triggered by movement or positioning of head per Headache guideline.  Appointment scheduled for 1346 01/04/12 with Dr Beverely Low.

## 2012-01-04 NOTE — Progress Notes (Signed)
  Subjective:    Patient ID: Kenneth Henson, male    DOB: 1940/12/27, 71 y.o.   MRN: [id number]  HPI L frontal HA- noted this AM, shortly after waking up and taking meds.  Checked CBG and it was elevated at 177 after eating cereal milk.  HA described as 'annoying pressure'.  Denies nasal congestion, sinus pressure.  No fevers, no visual changes.  No nausea.  Some light headedness.  HA has not changed from this AM.  Has not taken anything for pain.  At 11:30 am had bowl of chicken noodle soup.   Review of Systems For ROS see HPI     Objective:   Physical Exam  Constitutional: He is oriented to person, place, and time. He appears well-developed and well-nourished. No distress.  HENT:  Head: Normocephalic and atraumatic.       No TTP over sinuses + turbinate edema + PND TMs normal bilaterally  Eyes: Conjunctivae and EOM are normal. Pupils are equal, round, and reactive to light.  Neck: Normal range of motion. Neck supple.  Cardiovascular: Normal rate, regular rhythm and normal heart sounds.   Pulmonary/Chest: Effort normal and breath sounds normal. No respiratory distress. He has no wheezes.  Lymphadenopathy:    He has no cervical adenopathy.  Neurological: He is alert and oriented to person, place, and time. He has normal reflexes. No cranial nerve deficit. Coordination normal.  Skin: Skin is warm and dry.  Psychiatric: He has a normal mood and affect. His behavior is normal. Thought content normal.          Assessment & Plan:

## 2012-01-16 DIAGNOSIS — R519 Headache, unspecified: Secondary | ICD-10-CM | POA: Insufficient documentation

## 2012-01-16 DIAGNOSIS — R51 Headache: Secondary | ICD-10-CM | POA: Insufficient documentation

## 2012-01-16 NOTE — Assessment & Plan Note (Signed)
New.  Pt's HA is most likely due to sinus pressure from mild untreated seasonal allergies.  Reassured him that it has nothing to do w/ his blood sugar.  No red flags on hx or PE.  Pt to take tylenol/ibuprofen prn, consider OTC daily antihistamine if sxs don't improve.  Reviewed supportive care and red flags that should prompt return.  Pt expressed understanding and is in agreement w/ plan.

## 2012-02-23 ENCOUNTER — Encounter: Payer: Self-pay | Admitting: Cardiovascular Disease

## 2012-02-23 ENCOUNTER — Ambulatory Visit (INDEPENDENT_AMBULATORY_CARE_PROVIDER_SITE_OTHER): Payer: Medicare Other | Admitting: Cardiovascular Disease

## 2012-02-23 VITALS — BP 117/76 | HR 65 | Ht 68.0 in | Wt 235.0 lb

## 2012-02-23 DIAGNOSIS — I70219 Atherosclerosis of native arteries of extremities with intermittent claudication, unspecified extremity: Secondary | ICD-10-CM

## 2012-02-23 NOTE — Progress Notes (Signed)
HPI:  71 year old gentleman presenting for followup of lower extremity peripheral arterial disease. His cardiac issues are followed by Dr. Jens Som. The patient has left iliac disease and history of left leg claudication. ABIs last done in 2012 or 1.1 on the right and 0.95 on the left. The patient underwent CT angiography demonstrating 50-70% irregular stenosis in the left external iliac artery. He does not have infrainguinal disease.  Overall the patient is doing fairly well. He continues to have problems with his left knee. He underwent arthroscopic surgery but this did not help much. His physical activity is limited by left knee pain. He has mild discomfort in the left calf at times, but this really has not lifestyle limiting. He denies coldness in the foot or any history of skin changes or ulceration.  The patient's had no chest pain or chest pressure. He denies dyspnea, edema, orthopnea, or PND. He quit smoking 2 years ago. He is going to relocate to [place] in a few weeks.  Outpatient Encounter Prescriptions as of 02/23/2012  Medication Sig Dispense Refill  . allopurinol (ZYLOPRIM) 300 MG tablet Take 300 mg by mouth daily.        Marland Kitchen aspirin 81 MG tablet Take 81 mg by mouth daily.        Marland Kitchen glucose blood test strip Use as directed  300 each  3  . hydrochlorothiazide (MICROZIDE) 12.5 MG capsule Take 1 capsule (12.5 mg total) by mouth daily.  90 capsule  3  . lisinopril (PRINIVIL,ZESTRIL) 40 MG tablet Take 1 tablet (40 mg total) by mouth daily.  90 tablet  3  . metFORMIN (GLUCOPHAGE) 850 MG tablet Take 1 tablet (850 mg total) by mouth daily.  90 tablet  3  . ONETOUCH DELICA LANCETS MISC by Does not apply route 2 (two) times daily.        . sildenafil (VIAGRA) 100 MG tablet Take 1 tablet (100 mg total) by mouth as needed for erectile dysfunction.  20 tablet  1  . simvastatin (ZOCOR) 20 MG tablet Take 1 tablet (20 mg total) by mouth daily.  90 tablet  3  . tadalafil (CIALIS) 5 MG tablet  daily as needed. 1 tab daily      . temazepam (RESTORIL) 30 MG capsule Take 1 capsule (30 mg total) by mouth at bedtime as needed.  90 capsule  0  . traMADol (ULTRAM) 50 MG tablet Take 1 tablet (50 mg total) by mouth every 6 (six) hours as needed.  90 tablet  3  . triamcinolone ointment (KENALOG) 0.1 % Apply topically 2 (two) times daily.  90 g  3  . DISCONTD: tadalafil (CIALIS) 5 MG tablet 1 tab daily  30 tablet  6    No Known Allergies  Past Medical History  Diagnosis Date  . Hypertension   . Hyperlipidemia   . Claudication   . Neoplasm of skin     uncertain behavior  . Colon polyps   . Erectile dysfunction   . Abnormal involuntary movements   . Diabetes mellitus type II   . Arthritis   . Chicken pox     ROS: Negative except as per HPI  BP 117/76  Pulse 65  Ht 5\' 8"  (1.727 m)  Wt 106.595 kg (235 lb)  BMI 35.73 kg/m2  PHYSICAL EXAM: Pt is alert and oriented,  pleasant male in NAD HEENT: normal Neck: JVP - normal, carotids 2+= without bruits Lungs: CTA bilaterally CV: RRR without murmur or gallop Abd: soft,  NT, Positive BS, no hepatomegaly Ext: no C/C/E, distal pulses intact and equal Skin: warm/dry no rash  EKG:  Normal sinus rhythm 65 beats per minute, within normal limits.  ASSESSMENT AND PLAN:   1. Lower extremity peripheral arterial disease. The patient remains minimally symptomatic. I have reviewed his ABIs and most recent aortoiliac duplex scan. Continued medical therapy is appropriate. It would be reasonable to repeat his ABIs in one year.  2. Hypertension. Blood pressure is well controlled on his current medical program of hydrochlorothiazide and lisinopril.  3. Hyperlipidemia. Lipids from one year ago showed a cholesterol of 182, HDL 75, and LDL 92. History glycerides were 78. He continues with simvastatin 20 mg daily.  4. Type 2 diabetes. The patient is treated with metformin and he is followed by his primary care physician.  For followup, the patient  will reestablish with his primary care physician in [place]. We will provide all of his records for continuity of care.  Tonny Bollman 02/23/2012 10:59 AM

## 2012-02-23 NOTE — Patient Instructions (Signed)
Your physician recommends that you schedule a follow-up appointment as needed  

## 2012-06-01 ENCOUNTER — Other Ambulatory Visit: Payer: Self-pay | Admitting: Family Medicine

## 2012-06-27 IMAGING — CR DG CHEST 2V
2 series · 2 of 2 positions shown · non-contrast
Comparison: None.

CLINICAL DATA: Preop.

CHEST - 2 VIEW

[view not recorded (1 of 2)]
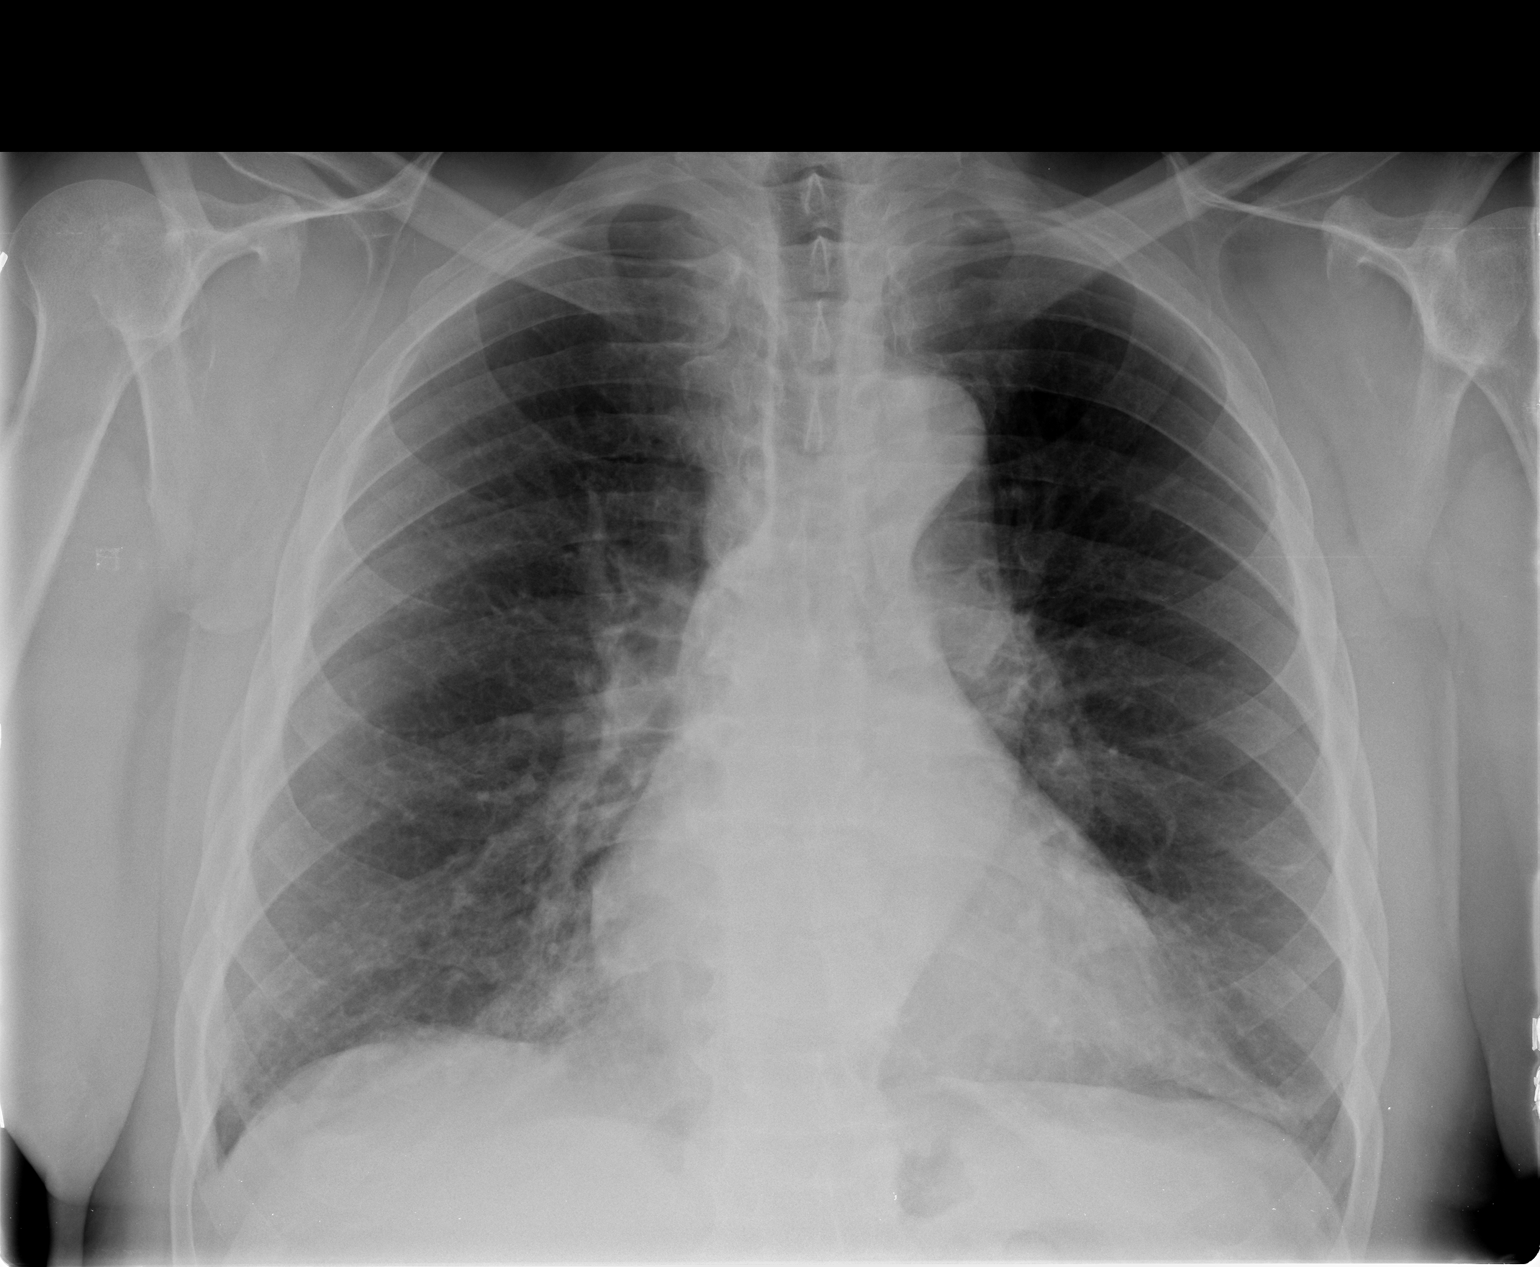

[view not recorded (2 of 2)]
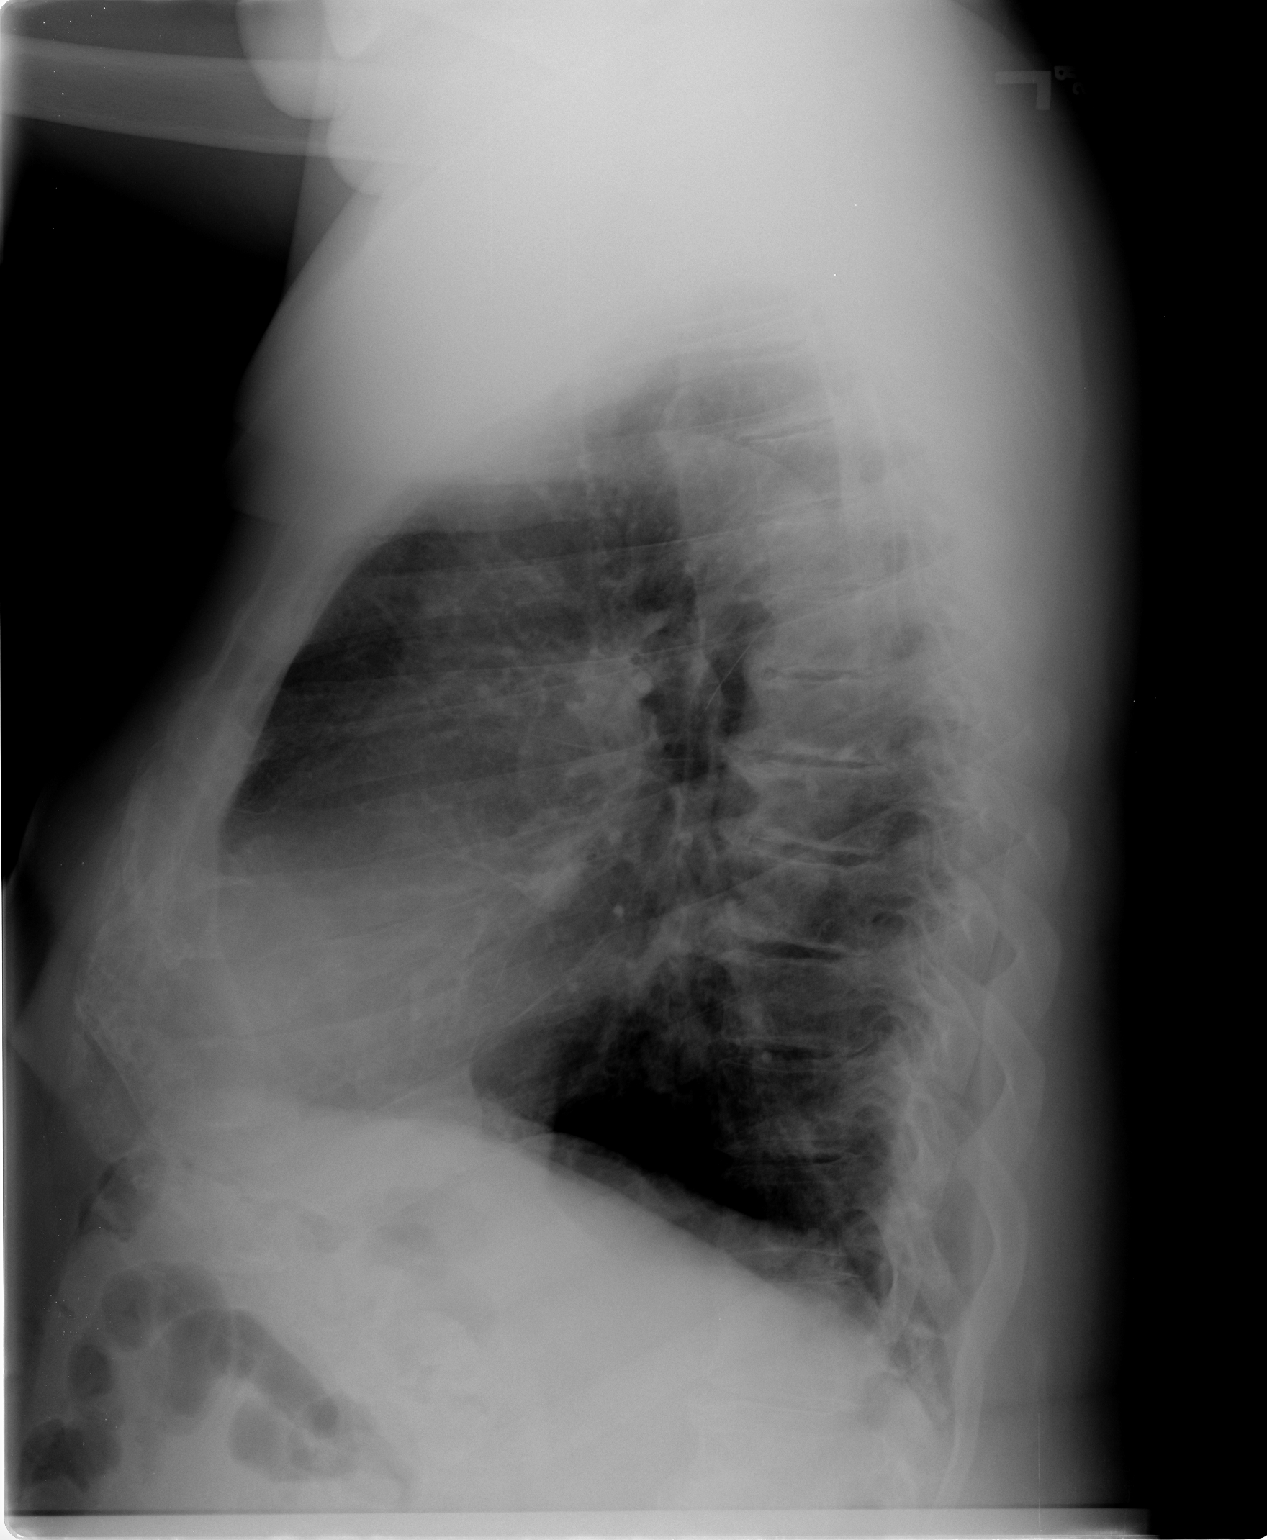

[2 of 2 positions shown; findings below may reference images not displayed]

FINDINGS: Trachea is midline.  Heart size normal.  Mild bibasilar
atelectasis and/or scarring.  Flattening of the hemidiaphragms with
enlargement of the retrosternal clear space.  No pleural fluid.
There are degenerative changes in the spine.
IMPRESSION: Mild bibasilar atelectasis and/or scarring.  No acute findings.

## 2014-05-13 ENCOUNTER — Inpatient Hospital Stay: Admit: 2014-05-13 | Payer: MEDICARE | Primary: Family Medicine

## 2014-05-13 DIAGNOSIS — M25551 Pain in right hip: Secondary | ICD-10-CM

## 2014-05-13 NOTE — Progress Notes (Addendum)
PHYSICAL THERAPY - DAILY TREATMENT NOTE    Patient Name: Nicholas [name]        Date: 05/13/2014  DOB: Jul 01, 1940   YES Patient DOB Verified  Visit #:   1   of   12  Insurance: Payor: [place] MEDICARE / Plan: [place] MEDICARE PART B / Product Type: Medicare /      In time: 950 Out time: 1020   Total Treatment Time: 30     Medicare Time Tracking (below)   Total Timed Codes (min):  30 1:1 Treatment Time:  30     TREATMENT AREA =  (R) hip pain    SUBJECTIVE    Pain Level (on 0 to 10 scale):  ie  / 10   Medication Changes/New allergies or changes in medical history, any new surgeries or procedures?    NO    If yes, update Summary List   Subjective Functional Status/Changes:    No changes reported     See POC          OBJECTIVE  8 min Manual Therapy -  DTM to (R) QL, glute med, MET for anterior innominate on (R)   8 min Patient Education:  YES  Reviewed HEP     Progressed/Changed HEP based on:        Other Objective/Functional Measures:    Shown and Performed HEP.     Post Treatment Pain Level (on 0 to 10) scale:   ie  / 10     ASSESSMENT      See Progress Note/Recertification   Patient will continue to benefit from skilled therapy to address remaining functional deficits: See POC   Progress toward goals / Updated goals:    See POC     PLAN      Upgrade activities as tolerated YES Continue plan of care     Discharge due to :      Other:      Therapist: Forest Gleason PT, DPT    Date: 05/13/2014 Time: 10:45 AM

## 2014-05-13 NOTE — Progress Notes (Signed)
[facility] ??? [facility]  [address], [address], [place], [place] [address] - Phone: [phone]  Fax: [phone]  PLAN OF CARE / STATEMENT OF MEDICAL NECESSITY FOR PHYSICAL THERAPY SERVICES  Patient Name: Nicholas [name] DOB: [birth year]   Treatment   Diagnosis: (R) iliac crest tendinitis  Medical   Diagnosis: Right hip pain [M25.551]   Onset Date: Chronic      Referral Source: Kimberlee NearingWarren, Paul, MD Start of Care [facility]): 05/13/2014   Prior Hospitalization: See medical history Provider #: [id number]   Prior Level of Function: Limited due to hx of (R) hip pain and (R) knee pain   Comorbidities: DM, HTN, OA, ETOH   Medications: Verified on Patient Summary List   The Plan of Care and following information is based on the information from the initial evaluation.   ==================================================================================  Assessment / key information: Pt is a 73 yo male s/p hx of progressive (R) posterior hip pain .  He presents with pain ranging from 7-10/10, located (R) posterior hip.  Pain is made worse with activity, laying down , better with rest, meds.  Palpation reveals TTP 2+ to (R) QL, glute med.   Gait: antalgic with decrease wbing on (R) .  Hip AROM/PROM: flexion 90 deg, extension 10 deg, abduction 30 deg, adduction midline deg, ER 30 deg,  IR 25 deg.  LE MMT: hip flex 4/5, abd 3+/5, add 4/5, ext 3+/5, knee flex 4/5, ext 4/5, ankle 4/5. Sensation is intact to light touch in the LE.  HS 90/90 30 dge (+) Special tests include: Hip socur. (-) Special tests include: SLR, Slump.   Pt is limited in the following activities: performing ADLs and leisure activities.  Pt will benefit from PT interventions to address the aforementioned deficits and allow pt to return to PLOF.    ==================================================================================  Problem List: pain affecting function, decrease ROM, decrease strength,  impaired gait/ balance, decrease ADL/ functional abilitiies, decrease activity tolerance, decrease flexibility/ joint mobility and decrease transfer abilities   Treatment Plan may include any combination of the following: Therapeutic exercise, Therapeutic activities, Neuromuscular re-education, Physical agent/modality, Gait/balance training, Manual therapy and Patient education  Patient / Family readiness to learn indicated by: asking questions, trying to perform skills and interest  Persons(s) to be included in education: patient (P)  Barriers to Learning/Limitations: no  Measures taken:    Patient Goal (s): Decrease pain and increase function    Patient self reported health status: good  Rehabilitation Potential: good  ? Short Term Goals: To be accomplished in  2-3  weeks:  1. Pt will be independent and compliant with HEP to decrease pain, increase ROM and return pt to PLOF.    2. Pt will note pain less than or equal to 5/10 at worst to allow increase in functional abilities.   3. Pt will demonstrates increase in (R) hip extension from 10 deg to 20 deg to allow increase in ADLs  ? Long Term Goals: To be accomplished in  4-6  weeks:  1. Increase score on FOTO by > or = to 13 points to demo an increase in functional activity tolerance with the LE.   2. Pt will note < or = 2/10 pain with all mobility to improve comfort with ADL???s.   3. Pt will demonstrates GROC >/= 4+ to allow increase in functional activity tolerance   Frequency / Duration:   Patient to be seen  2-3  times per week  for 4-6  weeks:  Patient / Caregiver education and instruction: self care, activity modification and exercises  G-Codes (GP): Mobility [id number] Current  CL= 60-79%  [id number] Goal  CK= 40-59%.  The severity rating is based on the FOTO Score       Therapist Signature: Nicholas [name] PT, DPT Date: 05/13/2014   Certification Period: 05/13/14-08/08/13 Time: 10:47 AM   =========================================================================== ================  I certify that the above Physical Therapy Services are being furnished while the patient is under my care.  I agree with the treatment plan and certify that this therapy is necessary.    Physician Signature:        Date:       Time:     Please sign and return to [facility] [place] or you may fax the signed copy to [phone].  Thank you.

## 2014-05-21 ENCOUNTER — Inpatient Hospital Stay: Admit: 2014-05-21 | Payer: MEDICARE | Primary: Family Medicine

## 2014-05-21 DIAGNOSIS — M25551 Pain in right hip: Secondary | ICD-10-CM

## 2014-05-21 NOTE — Progress Notes (Signed)
PHYSICAL THERAPY - DAILY TREATMENT NOTE    Patient Name: Nicholas [name]        Date: 05/21/2014  DOB: 1941-05-10   YES Patient DOB Verified  Visit #:   2   of   12  Insurance: Payor: [place] MEDICARE / Plan: [place] MEDICARE PART B / Product Type: Medicare /      In time: 955 Out time: 1045   Total Treatment Time: 50     Medicare Time Tracking (below)   Total Timed Codes (min):  40 1:1 Treatment Time:  35     TREATMENT AREA =  (R) hip pain    SUBJECTIVE  Pain Level (on 0 to 10 scale):  2  / 10   Medication Changes/New allergies or changes in medical history, any new surgeries or procedures?    NO    If yes, update Summary List   Subjective Functional Status/Changes:    No changes reported     Pt reporting pain just in the hip.  States he doesn't get any pain going down the leg.           OBJECTIVE  Modalities Rationale:     decrease inflammation and decrease pain  to improve patient's ability to perform pain free ADLs.     min  Estim, type/location:                                        att       unatt       w/US       w/ice      w/heat    min   Mechanical Traction: type/lbs                     pro     sup     int     cont      before manual      after manual    min   Ultrasound, settings/location:      min   Iontophoresis w/ dexamethasone, location:                                                 take home patch         in clinic   10 min   Ice       Heat    location/position: Supine, (R) hip.     min   Vasopneumatic Device, press/temp:     min   Other:     Skin assessment post-treatment (if applicable):      intact      redness- no adverse reaction     redness ??? adverse reaction:        30 (25)  min Therapeutic Exercise:    See flow sheet   Rationale:      increase ROM, increase strength, improve coordination and improve balance  to improve the patient???s ability to perform pain free ADLs.       10 Min Manual Therapy: STM/DTM and TPR to (R) glute med/min.    Rationale:      decrease pain, increase ROM, increase tissue extensibility and decrease trigger points  to improve patient's ability to perform pain free ADLs.  min Patient Education:  YES  Reviewed HEP     Progressed/Changed HEP based on:        Other Objective/Functional Measures:    Added multiple exercises to improve core strength and stability for ADLs.  See flow sheet.      Post Treatment Pain Level (on 0 to 10) scale:   6  / 10     ASSESSMENT  Assessment/Changes in Function:     Good tolerance to initial therex.  TTP along (R) glute med/min and piriformis.  No radiating symptoms throughout session.        See Progress Note/Recertification   Patient will continue to benefit from skilled PT services to modify and progress therapeutic interventions, address functional mobility deficits, address ROM deficits, address strength deficits, analyze and address soft tissue restrictions and analyze and cue movement patterns to attain remaining goals.   Progress toward goals / Updated goals:    Initiated therex.      PLAN    Upgrade activities as tolerated YES Continue plan of care     Discharge due to :      Other:      Therapist: Esmond CamperSeth B Ayaka Andes, PTA    Date: 05/21/2014 Time: 10:18 AM
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
## 2014-05-26 ENCOUNTER — Encounter: Payer: MEDICARE | Primary: Family Medicine

## 2014-05-28 ENCOUNTER — Encounter: Payer: MEDICARE | Primary: Family Medicine

## 2014-06-02 ENCOUNTER — Encounter: Payer: MEDICARE | Primary: Family Medicine

## 2014-06-04 ENCOUNTER — Encounter: Payer: MEDICARE | Primary: Family Medicine

## 2014-06-09 ENCOUNTER — Encounter: Payer: MEDICARE | Primary: Family Medicine

## 2014-06-11 ENCOUNTER — Encounter: Payer: MEDICARE | Primary: Family Medicine

## 2015-08-24 ENCOUNTER — Ambulatory Visit: Admit: 2015-08-24 | Discharge: 2015-08-24 | Payer: MEDICARE | Attending: Urology | Primary: Family Medicine

## 2015-08-24 DIAGNOSIS — N529 Male erectile dysfunction, unspecified: Secondary | ICD-10-CM

## 2015-08-24 LAB — AMB POC URINALYSIS DIP STICK AUTO W/O MICRO
Bilirubin (UA POC): NEGATIVE
Glucose (UA POC): NEGATIVE
Ketones (UA POC): NEGATIVE
Leukocyte esterase (UA POC): NEGATIVE
Nitrites (UA POC): NEGATIVE
Protein (UA POC): NEGATIVE mg/dL
Specific gravity (UA POC): 1.015 (ref 1.001–1.035)
Urobilinogen (UA POC): 0.2 (ref 0.2–1)
pH (UA POC): 6 (ref 4.6–8.0)

## 2015-08-24 MED ORDER — SILDENAFIL 100 MG TAB
100 mg | ORAL_TABLET | Freq: Every day | ORAL | 3 refills | Status: DC | PRN
Start: 2015-08-24 — End: 2015-09-16
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
## 2015-08-25 NOTE — Telephone Encounter (Signed)
Attempted to call the patient to schedule ici dosing, but got vm.  Left message for him to return call.

## 2015-09-16 ENCOUNTER — Ambulatory Visit: Admit: 2015-09-16 | Discharge: 2015-09-16 | Payer: MEDICARE | Primary: Family Medicine

## 2015-09-16 DIAGNOSIS — N521 Erectile dysfunction due to diseases classified elsewhere: Secondary | ICD-10-CM

## 2015-09-16 NOTE — Progress Notes (Signed)
Pre-Injection:    1.  Nurses PEP Checklist Completely reviewed and Consent signed?   yes    2.  Review patient history with physician?   Yes- dose ok per Dr Sharyn Creameronkin based on pt's hx.         Brief History:         A.  Patient's age:    75 y.o.         B.  ASCVD (history of MI)  no             TIA/Stroke yes              Hypertension    yes                Hyperlipidemia yes            CAD/ PVD yes            CKD(on Dialysis)  no            Prostate CA no            Diabetes  yes            Lung Problems   no            Liver Disorders    no            Blood Disorders   no        C.  Is patient on blood thinners?   no             If patient is on blood thinners, caution patient about application of pressure at the              injection site for 2-3 min's.       D.  Is patient on any medication?   yes    4.  Dose of Medication given for intracavernosal injection:       PROSTAGLANDIN(J0270) 30 MCGS/ 24UNITS      5.  Review Self-Injection Technique : Patient was instructed today how to cleanse the area of injection with alcohol pad/cotton ball prior to injection. Instructed patient how to center the auto-injector to the center of the penis for injection. Patient wa shown how to roll the veins on the side of the penis away from injection as to not cause considerable bruising or hematoma. Patient was instructed to alternate sides when injecting to reduce possibility of scarring and causing curvature of the penis (Peyronie's Disease). Discussed importance of holding pressure at the injection site for 1-2 mins to ensure bleeding has stopped prior to intercourse. Discussed possibility of any infection at the injection site and signs to look for infection- rarely occurs.    6. Drawing up medication: Patient was instructed how to properly clean the top of the vial of medication with alcohol prior to inserting the needle to draw up medication. Instructed patient how to draw the medication out  of the vial and prepare correct dose for injection. Instructed pt how to dispose of used needles at home.         POST-INJECTION    1.  Time Injected-  1045 am    2.  20 minute Post-Injection:                   Tumescence/Rigidity- 70/0%    3.  40 minutes Post-injection                     Tumescence/Rigidity-  70/0%    4.  60 minutes  Post-Injection                    Tumescence/Rigidity-  50/0%    5.   90 minutes Post-Injection                    Tumescence/Rigidity-     6.  Pain with Erection-  no    7.  Duration of erection-   Never achieved  rigid erection    8.  Did the patient spontaneously detumesce ?- yes        If no, please describe treatment of priapism     9.  Time patient discharged- 1140 am      10. Plan- WILL BRING PT BACK AND REDOSE WITH 1.0 ML OF TRIMIX (20/30/2/ML). DISCUSSED THE PLAN WITH DR Sharyn Creamer AND HE AGREES WITH THE PLAN FOR THIS PT TODAY.

## 2015-09-16 NOTE — Progress Notes (Signed)
I was present for the ICI dosing and administration. I reviewed the medications. I agree with the plan of care outlined in the nursing note.  Terrace Chiem, MD

## 2015-09-22 ENCOUNTER — Ambulatory Visit: Admit: 2015-09-22 | Discharge: 2015-09-22 | Payer: MEDICARE | Primary: Family Medicine

## 2015-09-22 DIAGNOSIS — N521 Erectile dysfunction due to diseases classified elsewhere: Secondary | ICD-10-CM

## 2015-09-22 MED ORDER — SYRINGE WITH NEEDLE 1 ML 27 X 1/2"
1 mL 27 x /2" | PEN_INJECTOR | 5 refills | Status: AC
Start: 2015-09-22 — End: ?

## 2015-09-22 MED ORDER — OTHER(NON-FORMULARY)
5 refills | Status: AC | PRN
Start: 2015-09-22 — End: ?

## 2015-09-22 NOTE — Progress Notes (Signed)
Pre-Injection:    1.  Nurses PEP Checklist Completely reviewed and Consent signed?   yes    2.  Review patient history with physician?   yes         Brief History:         A.  Patient's age:    75 y.o.         B.  ASCVD (history of MI)  no             Hypertension    yes                Hyperlipidemia yes            CAD no            CKD(on Dialysis)  no            Prostate CA no            Diabetes  no            Lung Problems   no            Liver Disorders    no            Blood Disorders   no        C.  Is patient on blood thinners?   no             If patient is on blood thinners, caution patient about application of pressure at the              injection site for 2-3 min's.       D.  Is patient on any medication?   yes    4.  Dose of Medication given for intracavernosal injection:         TRI-MIX(20 MCGS PGE(J0270)/30MG  PAPAVERINE(J2440)/                       2 MG REGITINE(J2760)/ML- 1.0 / 10 UNITS EACH    5.  Review Self-Injection Technique : Patient was instructed today how to cleanse the area of injection with alcohol pad/cotton ball prior to injection. Instructed patient how to center the auto-injector to the center of the penis for injection. Patient wa shown how to roll the veins on the side of the penis away from injection as to not cause considerable bruising or hematoma. Patient was instructed to alternate sides when injecting to reduce possibility of scarring and causing curvature of the penis (Peyronie's Disease). Discussed importance of holding pressure at the injection site for 1-2 mins to ensure bleeding has stopped prior to intercourse. Discussed possibility of any infection at the injection site and signs to look for infection- rarely occurs.    6. Drawing up medication: Patient was instructed how to properly clean the top of the vial of medication with alcohol prior to inserting the needle to draw up medication. Instructed patient how to draw the medication out  of the vial and prepare correct dose for injection. Instructed pt how to dispose of used needles at home.         POST-INJECTION    1.  Time Injected-  1050 AM    2.  20 minute Post-Injection:                   Tumescence/Rigidity- 80/40%    3.  40 minutes Post-injection  Tumescence/Rigidity-  80/40%    4.  60 minutes  Post-Injection                    Tumescence/Rigidity-  60/0%    5.   90 minutes Post-Injection                    Tumescence/Rigidity-     6.  Pain with Erection-  no    7.  Duration of erection-   NEVER ACHIEVED GOOD RIGIDITY    8.  Did the patient spontaneously detumesce ?-         If no, please describe treatment of priapism     9.  Time patient discharged- 1150 AM      10. Plan- WILL SEND OVER RX FOR STRONGEST TRIMIX MEDICATION TO [facility]. PT WANTS TO TRY AT HOME AND SEIF GETS BETTER RIGIDITY, ENOUGH FOR INTERCOURSE. HE WILL USE A FEW TIMES AND CALL ME AND LET ME KNOW WHAT OUTCOME. I DID GIVE HIM THE BROCHURE FOR IPP AND TALKED A LITTLE BIT WITH IM ABOUT IT AND IF WANTS TO DISCUSS FURTHER HE WILL COME IN AND SEE DR Charyl Bigger FOR A IPP CONSULT. DISCUSSED THE PLAN WITH DR Charyl Bigger AND HE AGREES WITH THE PLAN FOR THIS PT TODAY.                  I was present for the ICI dosing and administration. I reviewed the medications. I agree with the plan of care outlined in the nursing note.   Scot Dock, MD  [facility]  A Division of [facility] [place]

## 2018-07-24 ENCOUNTER — Ambulatory Visit: Attending: Urology | Primary: Family Medicine

## 2018-07-24 ENCOUNTER — Ambulatory Visit: Admit: 2018-07-24 | Discharge: 2018-07-24 | Payer: MEDICARE | Attending: Urology | Primary: Family Medicine

## 2018-07-24 DIAGNOSIS — N390 Urinary tract infection, site not specified: Secondary | ICD-10-CM

## 2018-07-24 LAB — AMB POC URINALYSIS DIP STICK AUTO W/O MICRO
Bilirubin (UA POC): NEGATIVE
Bilirubin, Urine, POC: NEGATIVE
Blood (UA POC): NEGATIVE
Blood (UA POC): NEGATIVE
Glucose (UA POC): NEGATIVE
Glucose, Urine, POC: NEGATIVE
Ketones (UA POC): NEGATIVE
Ketones, Urine, POC: NEGATIVE
Nitrite, Urine, POC: NEGATIVE
Nitrites (UA POC): NEGATIVE
Protein (UA POC): NEGATIVE
Protein, Urine, POC: NEGATIVE
Specific Gravity, Urine, POC: 1.01 NA (ref 1.001–1.035)
Specific gravity (UA POC): 1.01 (ref 1.001–1.035)
Urobilinogen (UA POC): 0.2 (ref 0.2–1)
Urobilinogen, POC: 0.2 (ref 0.2–1)
pH (UA POC): 6 (ref 4.6–8.0)
pH, Urine, POC: 6 NA (ref 4.6–8.0)

## 2018-07-24 LAB — AMB POC PVR, MEAS,POST-VOID RES,US,NON-IMAGING
PVR POC: 24 cc
PVR: 24 cc

## 2018-07-24 NOTE — Progress Notes (Signed)
No need to treat.

## 2018-07-24 NOTE — Progress Notes (Signed)
Appointment Information   Name: Jaceyon, Thornberry MRN: [id number]   Date: 08/02/2018 Status: Sch   Time: 11:00 AM Length: 30   Visit Type: US RENAL RETROPERITONEAL [[id number]]

## 2018-07-24 NOTE — Progress Notes (Signed)
Faxed imaging to [facility]; [facility] [phone]

## 2018-07-24 NOTE — Progress Notes (Signed)
Nicholas [name] has order for  RUS    To be done at [facility]    Needed by 08/15/2018    Patient has a follow-up appointment:  08/15/2018    Best number to contact patient:  [phone]        [name]

## 2018-07-24 NOTE — Progress Notes (Signed)
Progress  Notes by Page Spiro, MD at 07/24/18 1000                Author: Page Spiro, MD  Service: --  Author Type: Physician       Filed: 07/24/18 1033  Encounter Date: 07/24/2018  Status: Signed          Editor: Page Spiro, MD (Physician)                                  Assessment / Plan                  ICD-10-CM  ICD-9-CM             1.  Recurrent UTI  N39.0  599.0  ciprofloxacin HCl (CIPRO) 250 mg tablet                [place] RETROPERITONEUM COMP           AMB POC URINALYSIS DIP STICK AUTO W/O MICRO           URINE C&S           2.  BPH with obstruction/lower urinary tract symptoms  N40.1  600.01              N13.8  599.69             3.  Frequency of micturition  R35.0  788.41  AMB POC PVR, MEAS,POST-VOID RES,US,NON-IMAGING     4.  Urgency incontinence  N39.41  788.31             5.  Nocturia  R35.1  788.43             1.  Recurrent UTI     Urine cultures on 11/29/17, 05/24/18, 06/05/18 and 07/10/2018 all positive for Klebsiella pneumoniae     Currently finishing 10 day course cipro.  Repeat urine culture today.    Check renal US    F/u for diagnostic cysto      2.  BPH with LUTS.  Minimal PVR today.           Chief Complaint       Patient presents with        ?  Recurrent UTI             Pt c/o discomfort with urination when he has a UTI. Is currently taking cipro, states this is his second UTI in the past year.              History of Present Illness:     Nicholas [name] is a 78 y.o.  BL[place] male  who presents for evaluation and treatment of recurrent UTI.        Patient was last seen in 08/2015 by Dr. Charyl Bigger for ED.      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 Initially managed with Viagra 100 mg with good response, but has become less effective over time. It appears that he was not taking medication  with an empty stomach. Tried VED, but doesn't work well for him. Underwent PEP teaching with Donzetta Starch in 2017.  Currently not using.         Interval History:    Presents today for h/o recurrent UTI's.  Urine cultures on 11/29/17, 05/24/18, 06/05/18 and 07/10/2018 all positive for Klebsiella pneumoniae  Urinary symptoms: nocturia x 3, daytime urinary frequency/urgency with some urgency incontinence.  Denies  urinary hesitancy or weak stream.  Some discomfort in penis during voiding over past 6 months.   Currently not on BPH med.      Currently on cipro bid x 10 days per PCP.         No flowsheet data found.         Past Medical History:        Diagnosis  Date         ?  Arthropathy       ?  Cerebral artery occlusion with cerebral infarction [facility])       ?  Concussion with loss of consciousness of 30 minutes or less       ?  DM (diabetes mellitus) (HCC)       ?  ED (erectile dysfunction)       ?  Gout       ?  HTN (hypertension)       ?  Hypercholesteremia       ?  Lower GI bleed           ?  PVD (peripheral vascular disease) (HCC)            Past Surgical History:         Procedure  Laterality  Date          ?  HX HEMORRHOIDECTOMY    01/2014     ?  HX OTHER SURGICAL    02/23/2015          CUBITAL TUNNEL RELEASE          ?  HX POLYPECTOMY    05/16/2013          ?  HX SHOULDER REPLACEMENT              Family History         Problem  Relation  Age of Onset          ?  Heart Disease  Mother       ?  Hypertension  Mother       ?  Stroke  Father            ?  Breast Cancer  Sister            Social History          Socioeconomic History         ?  Marital status:  SINGLE              Spouse name:  Not on file         ?  Number of children:  Not on file     ?  Years of education:  Not on file     ?  Highest education level:  Not on file       Occupational History        ?  Not on file       Social Needs         ?  Financial resource strain:  Not on file        ?  Food insecurity              Worry:  Not on file         Inability:  Not on file        ?  Transportation needs  Medical:  Not on file         Non-medical:  Not on file       Tobacco Use         ?  Smoking  status:  Former Smoker              Packs/day:  0.50         Years:  10.00         Pack years:  5.00         Types:  Cigarettes         Last attempt to quit:  05/15/2009         Years since quitting:  9.1         ?  Smokeless tobacco:  Former Neurosurgeon              Quit date:  01/19/2010       Substance and Sexual Activity         ?  Alcohol use:  Yes     ?  Drug use:  No     ?  Sexual activity:  Not Currently              Partners:  Female             Comment: ISSUES WITH E.D.       Lifestyle        ?  Physical activity              Days per week:  Not on file         Minutes per session:  Not on file         ?  Stress:  Not on file       Relationships        ?  Social Engineer, manufacturing systems on phone:  Not on file         Gets together:  Not on file         Attends religious service:  Not on file         Active member of club or organization:  Not on file         Attends meetings of clubs or organizations:  Not on file         Relationship status:  Not on file        ?  Intimate partner violence              Fear of current or ex partner:  Not on file         Emotionally abused:  Not on file         Physically abused:  Not on file         Forced sexual activity:  Not on file        Other Topics  Concern        ?  Not on file       Social History Narrative        ?  Not on file          Current Outpatient Medications        Medication  Sig         ?  amLODIPine (NORVASC) 5 mg tablet       ?  ciprofloxacin HCl (CIPRO) 250 mg tablet  take 1 tablet by mouth twice a day for 10 days     ?  glipiZIDE (GLUCOTROL) 5 mg tablet       ?  ibuprofen (MOTRIN) 800 mg tablet       ?  hydroCHLOROthiazide (HYDRODIURIL) 25 mg tablet       ?  lisinopriL (PRINIVIL, ZESTRIL) 40 mg tablet       ?  metFORMIN (GLUCOPHAGE) 1,000 mg tablet       ?  temazepam (RESTORIL) 30 mg capsule       ?  traMADoL (ULTRAM) 50 mg tablet  Take 50 mg by mouth.     ?  Syringe with Needle, Disp, (ALLERGY SYRINGE) 1 mL 27 x 1/2" syrg  FOR USE WITH TRIMIX  INTRAVERNOSAL INJECTIONS.     ?  OTHER,NON-FORMULARY,  1 mL by IntraCAVernosal route as needed.     ?  simvastatin (ZOCOR) 20 mg tablet  Take  by mouth nightly.         ?  budesonide-formoterol (SYMBICORT) 160-4.5 mcg/actuation HFA inhaler  Take 2 Puffs by inhalation two (2) times a day.          No current facility-administered medications for this visit.         No Known Allergies         Review of Systems   Constitutional: Fever: No   Skin: Rash: No   HEENT: Hearing difficulty: No   Eyes: Blurred vision: No   Cardiovascular: Chest pain: No   Respiratory: Shortness of breath: No   Gastrointestinal: Nausea/vomiting: No   Musculoskeletal: Back pain: No   Neurological: Weakness: No   Psychological: Memory loss: No   Comments/additional findings:             Physical Exam:        Estimated body mass index is 35.88 kg/m?? as calculated from the following:     Height as of this encounter:  (1.753 m).     Weight as of this encounter: 243 lb (110.2 kg).    Head: Normocephalic, without obvious abnormality, atraumatic   Eyes: negative   Neck: supple, symmetrical, trachea midline   Chest:  Normal respiratory effort and excursions.    Back: symmetric, no curvature. ROM normal. No CVA tenderness.   Abdomen: soft, non-tender. No masses,  no organomegaly   GU:  Normal external genitalia   DRE:  Normal sphincter tone, prostate 30 grams, no nodules or tenderness.   Extremities: extremities normal, atraumatic, no cyanosis or edema   Skin: Skin color, texture, turgor normal. No rashes or lesions   Neurologic: Grossly normal      LABS:     Results for orders placed or performed in visit on 07/24/18     AMB POC PVR, MEAS,POST-VOID RES,US,NON-IMAGING         Result  Value  Ref Range            PVR  24  cc       AMB POC URINALYSIS DIP STICK AUTO W/O MICRO         Result  Value  Ref Range            Color (UA POC)  Yellow         Clarity (UA POC)  Clear         Glucose (UA POC)  Negative  Negative       Bilirubin (UA POC)  Negative   Negative       Ketones (UA POC)  Negative  Negative  Specific gravity (UA POC)  1.010  1.001 - 1.035            Blood (UA POC)  Negative  Negative       pH (UA POC)  6.0  4.6 - 8.0       Protein (UA POC)  Negative  Negative       Urobilinogen (UA POC)  0.2 mg/dL  0.2 - 1       Nitrites (UA POC)  Negative  Negative            Leukocyte esterase (UA POC)  1+  Negative           PSA Trend:   No results found for: PSA, Sherle Poe, PSAR3, [id number], [id number], PSALT, 69629, PSAEXT         IMAGING:                  This note has been sent to the referring physician, with findings and recommendations .      Page Spiro, MD, FACS      CC:   Doloris Hall, MD   [address]   [facility] [place] [address]      Medical documentation is provided with the assistance of Emi Belfast, medical scribe for Page Spiro, MD  on 07/24/2018       Patient's BMI is out of the normal parameters.  Information about BMI was given and patient was advised to follow-up with their PCP for further management.

## 2018-07-24 NOTE — Progress Notes (Signed)
Faxed imaging to [facility]; [facility] [phone]

## 2018-07-24 NOTE — Progress Notes (Signed)
Nicholas [name] has order for  RUS    To be done at [facility]    Needed by 08/15/2018    Patient has a follow-up appointment:  08/15/2018    Best number to contact patient:  [phone]        Kaitland Draper

## 2018-07-24 NOTE — Progress Notes (Signed)
Assessment / Plan       ICD-10-CM ICD-9-CM    1. Recurrent UTI N39.0 599.0 ciprofloxacin HCl (CIPRO) 250 mg tablet      [place] RETROPERITONEUM COMP      AMB POC URINALYSIS DIP STICK AUTO W/O MICRO      URINE C&S   2. 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 Frequency of micturition R35.0 788.41 AMB POC PVR, MEAS,POST-VOID RES,US,NON-IMAGING   4. Urgency incontinence N39.41 788.31    5. Nocturia R35.1 788.43        1.  Recurrent UTI    Urine cultures on 11/29/17, 05/24/18, 06/05/18 and 07/10/2018 all positive for Klebsiella pneumoniae    Currently finishing 10 day course cipro.  Repeat urine culture today.   Check renal US   F/u for diagnostic cysto    2.  BPH with LUTS.  Minimal PVR today.      Chief Complaint   Patient presents with   ??? Recurrent UTI     Pt c/o discomfort with urination when he has a UTI. Is currently taking cipro, states this is his second UTI in the past year.        History of Present Illness:   Nicholas [name] is a 78 y.o. BLACK OR [place] male who presents for evaluation and treatment of recurrent UTI.      Patient was last seen in 08/2015 by Dr. Charyl Bigger for ED.    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 Initially managed with Viagra 100 mg with good response, but has become less effective over time. It appears that he was not taking medication with an empty stomach. Tried VED, but doesn't work well for him. Underwent PEP teaching with Donzetta Starch in 2017.  Currently not using.      Interval History:  Presents today for h/o recurrent UTI's.  Urine cultures on 11/29/17, 05/24/18, 06/05/18 and 07/10/2018 all positive for Klebsiella pneumoniae     Urinary symptoms: nocturia x 3, daytime urinary frequency/urgency with some urgency incontinence.  Denies  urinary hesitancy or weak stream.  Some discomfort in penis during voiding over past 6 months.  Currently not on BPH med.    Currently on cipro bid x 10 days per PCP.       No flowsheet data found.     Past Medical History:   Diagnosis Date   ??? Arthropathy    ??? Cerebral artery occlusion with cerebral infarction [facility])    ??? Concussion with loss of consciousness of 30 minutes or less    ??? DM (diabetes mellitus) (HCC)    ??? ED (erectile dysfunction)    ??? Gout    ??? HTN (hypertension)    ??? Hypercholesteremia    ??? Lower GI bleed    ??? PVD (peripheral vascular disease) [facility])      Past Surgical History:   Procedure Laterality Date   ??? HX HEMORRHOIDECTOMY  01/2014   ??? HX OTHER SURGICAL  02/23/2015    CUBITAL TUNNEL RELEASE   ??? HX POLYPECTOMY  05/16/2013   ??? HX SHOULDER REPLACEMENT       Family History   Problem Relation Age of Onset   ??? Heart Disease Mother    ??? Hypertension Mother    ??? Stroke Father    ??? Breast Cancer Sister      Social History     Socioeconomic History   ??? Marital  status: SINGLE     Spouse name: Not on file   ??? Number of children: Not on file   ??? Years of education: Not on file   ??? Highest education level: Not on file   Occupational History   ??? Not on file   Social Needs   ??? Financial resource strain: Not on file   ??? Food insecurity     Worry: Not on file     Inability: Not on file   ??? Transportation needs     Medical: Not on file     Non-medical: Not on file   Tobacco Use   ??? Smoking status: Former Smoker     Packs/day: 0.50     Years: 10.00     Pack years: 5.00     Types: Cigarettes     Last attempt to quit: 05/15/2009     Years since quitting: 9.1   ??? Smokeless tobacco: Former Neurosurgeon     Quit date: 01/19/2010   Substance and Sexual Activity   ??? Alcohol use: Yes   ??? Drug use: No   ??? Sexual activity: Not Currently     Partners: Female     Comment: ISSUES WITH E.D.   Lifestyle   ??? Physical activity     Days per week: Not on file     Minutes per session: Not on file   ??? Stress: Not on file   Relationships   ??? Social Wellsite geologist on phone: Not on file     Gets together: Not on file     Attends religious service: Not on file      Active member of club or organization: Not on file     Attends meetings of clubs or organizations: Not on file     Relationship status: Not on file   ??? Intimate partner violence     Fear of current or ex partner: Not on file     Emotionally abused: Not on file     Physically abused: Not on file     Forced sexual activity: Not on file   Other Topics Concern   ??? Not on file   Social History Narrative   ??? Not on file     Current Outpatient Medications   Medication Sig   ??? amLODIPine (NORVASC) 5 mg tablet    ??? ciprofloxacin HCl (CIPRO) 250 mg tablet take 1 tablet by mouth twice a day for 10 days   ??? glipiZIDE (GLUCOTROL) 5 mg tablet    ??? ibuprofen (MOTRIN) 800 mg tablet    ??? hydroCHLOROthiazide (HYDRODIURIL) 25 mg tablet    ??? lisinopriL (PRINIVIL, ZESTRIL) 40 mg tablet    ??? metFORMIN (GLUCOPHAGE) 1,000 mg tablet    ??? temazepam (RESTORIL) 30 mg capsule    ??? traMADoL (ULTRAM) 50 mg tablet Take 50 mg by mouth.   ??? Syringe with Needle, Disp, (ALLERGY SYRINGE) 1 mL 27 x 1/2" syrg FOR USE WITH TRIMIX INTRAVERNOSAL INJECTIONS.   ??? OTHER,NON-FORMULARY, 1 mL by IntraCAVernosal route as needed.   ??? simvastatin (ZOCOR) 20 mg tablet Take  by mouth nightly.   ??? budesonide-formoterol (SYMBICORT) 160-4.5 mcg/actuation HFA inhaler Take 2 Puffs by inhalation two (2) times a day.     No current facility-administered medications for this visit.      No Known Allergies      Review of Systems  Constitutional: Fever: No  Skin: Rash: No  HEENT: Hearing difficulty: No  Eyes:  Blurred vision: No  Cardiovascular: Chest pain: No  Respiratory: Shortness of breath: No  Gastrointestinal: Nausea/vomiting: No  Musculoskeletal: Back pain: No  Neurological: Weakness: No  Psychological: Memory loss: No  Comments/additional findings:        Physical Exam:     Estimated body mass index is 35.88 kg/m?? as calculated from the following:    Height as of this encounter:  (1.753 m).    Weight as of this encounter: 243 lb (110.2 kg).    Head: Normocephalic, without obvious abnormality, atraumatic  Eyes: negative  Neck: supple, symmetrical, trachea midline  Chest:  Normal respiratory effort and excursions.   Back: symmetric, no curvature. ROM normal. No CVA tenderness.  Abdomen: soft, non-tender. No masses,  no organomegaly  GU:  Normal external genitalia  DRE:  Normal sphincter tone, prostate 30 grams, no nodules or tenderness.  Extremities: extremities normal, atraumatic, no cyanosis or edema  Skin: Skin color, texture, turgor normal. No rashes or lesions  Neurologic: Grossly normal    LABS:  Results for orders placed or performed in visit on 07/24/18   AMB POC PVR, MEAS,POST-VOID RES,US,NON-IMAGING   Result Value Ref Range    PVR 24 cc   AMB POC URINALYSIS DIP STICK AUTO W/O MICRO   Result Value Ref Range    Color (UA POC) Yellow     Clarity (UA POC) Clear     Glucose (UA POC) Negative Negative    Bilirubin (UA POC) Negative Negative    Ketones (UA POC) Negative Negative    Specific gravity (UA POC) 1.010 1.001 - 1.035    Blood (UA POC) Negative Negative    pH (UA POC) 6.0 4.6 - 8.0    Protein (UA POC) Negative Negative    Urobilinogen (UA POC) 0.2 mg/dL 0.2 - 1    Nitrites (UA POC) Negative Negative    Leukocyte esterase (UA POC) 1+ Negative       PSA Trend:  No results found for: PSA, Sherle Poe, PSAR3, [id number], [id number], PSALT, [id number], PSAEXT      IMAGING:            This note has been sent to the referring physician, with findings and recommendations.    Page Spiro, MD, FACS    CC:  Doloris Hall, MD  [address]  [facility] [place] [address]    Medical documentation is provided with the assistance of Emi Belfast, medical scribe for Page Spiro, MD on 07/24/2018     Patient's BMI is out of the normal parameters.  Information about BMI was given and patient was advised to follow-up with their PCP for further management.

## 2018-07-24 NOTE — Progress Notes (Signed)
Appointment Information   Name: [name], Nicholas L MRN: [id number]   Date: 08/02/2018 Status: Sch   Time: 11:00 AM Length: 30   Visit Type: US RENAL RETROPERITONEAL [17057]

## 2018-07-26 LAB — URINE C&S

## 2018-08-06 ENCOUNTER — Encounter

## 2018-08-06 NOTE — Progress Notes (Signed)
Please inform patient normal renal US result.  Can defer cysto and reschedule in approx 3 months from now.

## 2018-08-06 NOTE — Progress Notes (Signed)
Please inform patient normal renal US result.  Can defer cysto and reschedule in approx 3 months from now.

## 2018-08-07 NOTE — Telephone Encounter (Signed)
Informed pt per Dr. Verdie Mosher:    Please inform patient normal renal US result. ??Can defer cysto and reschedule in approx 3 months from now.     Pt thanked me for the rus results. Rescheduled pt's cysto to October 24, 2018. Pt thanked me and the call was ended.    Angelica Chessman  (CJ)

## 2018-08-15 ENCOUNTER — Encounter: Payer: MEDICARE | Attending: Urology | Primary: Family Medicine

## 2018-10-24 ENCOUNTER — Encounter: Payer: MEDICARE | Attending: Urology | Primary: Family Medicine

## 2018-10-24 NOTE — Progress Notes (Deleted)
Assessment / Plan       ICD-10-CM ICD-9-CM    1. Recurrent UTI N39.0 599.0    2. 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 Frequency of micturition R35.0 788.41    4. Nocturia R35.1 788.43    5. Urgency incontinence N39.41 788.31        1.  Recurrent UTI   Urine cultures on 11/29/17, 05/24/18, 06/05/18 and 07/10/2018 all positive for Klebsiella pneumoniae   ***Currently finishing 10 day course cipro.  Repeat urine culture today.  Negative RUS 08/02/18  Diagnostic cystoscopy 10/24/18: ***    2.  BPH with LUTS  PVR***      No chief complaint on file.      History of Present Illness:   Nicholas [name] is a 78 y.o. BLACK OR [place] male who presents for evaluation and treatment of recurrent UTI.      Patient was last seen in 08/2015 by Dr. Kizzie Fantasia for ED.    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 Initially managed with Viagra 100 mg with good response, but has become less effective over time. It appears that he was not taking medication with an empty stomach. Tried VED, but doesn't work well for him. Underwent PEP teaching with Mal Amabile in 2017.  Currently not using.    Urine cultures on 11/29/17, 05/24/18, 06/05/18 and 07/10/2018 all positive for Klebsiella pneumonia    Urinary symptoms: nocturia x 3, daytime urinary frequency/urgency with some urgency incontinence.  Denies  urinary hesitancy or weak stream.  Some discomfort in penis during voiding over past 9 months.  Currently not on BPH med.    On cipro bid x 10 days per PCP (07/2018).    Interval History:  Presents today for RUS review and diagnostic cystoscopy to complete recurrent UTI workup. See separate procedure note. ***      No flowsheet data found.     Past Medical History:   Diagnosis Date   ??? Arthropathy    ??? Cerebral artery occlusion with cerebral infarction [facility])    ??? Concussion with loss of consciousness of 30 minutes or less    ??? DM (diabetes mellitus) ([place])     ??? ED (erectile dysfunction)    ??? Gout    ??? HTN (hypertension)    ??? Hypercholesteremia    ??? Lower GI bleed    ??? PVD (peripheral vascular disease) [facility])      Past Surgical History:   Procedure Laterality Date   ??? HX HEMORRHOIDECTOMY  01/2014   ??? HX OTHER SURGICAL  02/23/2015    CUBITAL TUNNEL RELEASE   ??? HX POLYPECTOMY  05/16/2013   ??? HX SHOULDER REPLACEMENT       Family History   Problem Relation Age of Onset   ??? Heart Disease Mother    ??? Hypertension Mother    ??? Stroke Father    ??? Breast Cancer Sister      Social History     Socioeconomic History   ??? Marital status: SINGLE     Spouse name: Not on file   ??? Number of children: Not on file   ??? Years of education: Not on file   ??? Highest education level: Not on file   Occupational History   ??? Not on file   Social Needs   ??? Financial resource strain: Not on file   ??? Food insecurity  Worry: Not on file     Inability: Not on file   ??? Transportation needs     Medical: Not on file     Non-medical: Not on file   Tobacco Use   ??? Smoking status: Former Smoker     Packs/day: 0.50     Years: 10.00     Pack years: 5.00     Types: Cigarettes     Last attempt to quit: 05/15/2009     Years since quitting: 9.4   ??? Smokeless tobacco: Former NeurosurgeonUser     Quit date: 01/19/2010   Substance and Sexual Activity   ??? Alcohol use: Yes   ??? Drug use: No   ??? Sexual activity: Not Currently     Partners: Female     Comment: ISSUES WITH E.D.   Lifestyle   ??? Physical activity     Days per week: Not on file     Minutes per session: Not on file   ??? Stress: Not on file   Relationships   ??? Social Wellsite geologistconnections     Talks on phone: Not on file     Gets together: Not on file     Attends religious service: Not on file     Active member of club or organization: Not on file     Attends meetings of clubs or organizations: Not on file     Relationship status: Not on file   ??? Intimate partner violence     Fear of current or ex partner: Not on file     Emotionally abused: Not on file     Physically abused: Not on file      Forced sexual activity: Not on file   Other Topics Concern   ??? Not on file   Social History Narrative   ??? Not on file     Current Outpatient Medications   Medication Sig   ??? amLODIPine (NORVASC) 5 mg tablet    ??? ciprofloxacin HCl (CIPRO) 250 mg tablet take 1 tablet by mouth twice a day for 10 days   ??? glipiZIDE (GLUCOTROL) 5 mg tablet    ??? ibuprofen (MOTRIN) 800 mg tablet    ??? hydroCHLOROthiazide (HYDRODIURIL) 25 mg tablet    ??? lisinopriL (PRINIVIL, ZESTRIL) 40 mg tablet    ??? metFORMIN (GLUCOPHAGE) 1,000 mg tablet    ??? temazepam (RESTORIL) 30 mg capsule    ??? traMADoL (ULTRAM) 50 mg tablet Take 50 mg by mouth.   ??? Syringe with Needle, Disp, (ALLERGY SYRINGE) 1 mL 27 x 1/2" syrg FOR USE WITH TRIMIX INTRAVERNOSAL INJECTIONS.   ??? OTHER,NON-FORMULARY, 1 mL by IntraCAVernosal route as needed.   ??? simvastatin (ZOCOR) 20 mg tablet Take  by mouth nightly.   ??? budesonide-formoterol (SYMBICORT) 160-4.5 mcg/actuation HFA inhaler Take 2 Puffs by inhalation two (2) times a day.     No current facility-administered medications for this visit.      No Known Allergies      Review of Systems  Constitutional: Fever:   Skin: Rash:   HEENT: Hearing difficulty:   Eyes: Blurred vision:   Cardiovascular: Chest pain:   Respiratory: Shortness of breath:   Gastrointestinal: Nausea/vomiting:   Musculoskeletal: Back pain:   Neurological: Weakness:   Psychological: Memory loss:   Comments/additional findings:        Physical Exam:     Estimated body mass index is 35.88 kg/m?? as calculated from the following:    Height as of 07/24/18: 5\' 9"  (1.753  m).    Weight as of 07/24/18: 243 lb (110.2 kg).   Head: Normocephalic, without obvious abnormality, atraumatic  Eyes: negative  Neck: supple, symmetrical, trachea midline  Chest:  Normal respiratory effort and excursions.   Back: symmetric, no curvature. ROM normal. No CVA tenderness.  Abdomen: soft, non-tender. No masses,  no organomegaly  GU:  Normal external genitalia   DRE:  Normal sphincter tone, prostate 30 grams, no nodules or tenderness.  Extremities: extremities normal, atraumatic, no cyanosis or edema  Skin: Skin color, texture, turgor normal. No rashes or lesions  Neurologic: Grossly normal    LABS:  Results for orders placed or performed in visit on 07/24/18   URINE C&S   Result Value Ref Range    FINAL REPORT Microbiology results    AMB POC PVR, MEAS,POST-VOID RES,US,NON-IMAGING   Result Value Ref Range    PVR 24 cc   AMB POC URINALYSIS DIP STICK AUTO W/O MICRO   Result Value Ref Range    Color (UA POC) Yellow     Clarity (UA POC) Clear     Glucose (UA POC) Negative Negative    Bilirubin (UA POC) Negative Negative    Ketones (UA POC) Negative Negative    Specific gravity (UA POC) 1.010 1.001 - 1.035    Blood (UA POC) Negative Negative    pH (UA POC) 6.0 4.6 - 8.0    Protein (UA POC) Negative Negative    Urobilinogen (UA POC) 0.2 mg/dL 0.2 - 1    Nitrites (UA POC) Negative Negative    Leukocyte esterase (UA POC) 1+ Negative       PSA Trend:  No results found for: PSA, Kaleen MaskSA2, PSAR1, PSA1, PSAR2, PSA3, PSAR3, [id number], [id number], PSALT, [id number], PSAEXT      IMAGING:    RUS 08/02/18  FINDINGS:  ??  RIGHT KIDNEY: 12.2 cm. No hydronephrosis or solid renal mass. No visible calculi. Normal renal echotexture and cortical thickness.  ??  LEFT KIDNEY: 12.9 cm. No hydronephrosis or solid renal mass. No visible calculi. Normal renal echotexture and cortical thickness.  ??  BLADDER: No intraluminal calculi or debris. No wall thickening. Normal bilateral ureteral jets. Prevoid volume measures 94.9 cc.  ????  IMPRESSION  Unremarkable ultrasound of bilateral kidneys.    This note has been sent to the referring physician, with findings and recommendations.    Page SpiroJohn S. Liu, MD, FACS    CC:  Doloris Hallallahan, Robert D, MD  [address]  [place] [id number]    Medical Documentation is provided with the assistance of Beaulah CorinXipi Emersyn Kotarski, medical scribe for Beaulah CorinXipi Alora Gorey on 10/24/2018.     There is no height or weight on file to calculate BMI. Patient's BMI is out of the normal parameters.  Information about BMI was given and patient was advised to follow-up with their PCP for further management.

## 2018-10-31 ENCOUNTER — Ambulatory Visit: Attending: Urology | Primary: Family Medicine

## 2018-10-31 ENCOUNTER — Ambulatory Visit: Admit: 2018-10-31 | Discharge: 2018-10-31 | Payer: MEDICARE | Attending: Urology | Primary: Family Medicine

## 2018-10-31 DIAGNOSIS — N390 Urinary tract infection, site not specified: Secondary | ICD-10-CM

## 2018-10-31 LAB — AMB POC URINALYSIS DIP STICK AUTO W/O MICRO
Bilirubin (UA POC): NEGATIVE
Bilirubin, Urine, POC: NEGATIVE
Blood (UA POC): NEGATIVE
Blood (UA POC): NEGATIVE
Glucose (UA POC): NEGATIVE
Glucose, Urine, POC: NEGATIVE
Ketones (UA POC): NEGATIVE
Ketones, Urine, POC: NEGATIVE
Leukocyte Esterase, Urine, POC: NEGATIVE
Leukocyte esterase (UA POC): NEGATIVE
Nitrite, Urine, POC: NEGATIVE
Nitrites (UA POC): NEGATIVE
Protein (UA POC): NEGATIVE
Protein, Urine, POC: NEGATIVE
Specific Gravity, Urine, POC: 1.02 NA (ref 1.001–1.035)
Specific gravity (UA POC): 1.02 (ref 1.001–1.035)
Urobilinogen (UA POC): 1 (ref 0.2–1)
Urobilinogen, POC: 1 (ref 0.2–1)
pH (UA POC): 5.5 (ref 4.6–8.0)
pH, Urine, POC: 5.5 NA (ref 4.6–8.0)

## 2018-10-31 NOTE — Progress Notes (Signed)
Progress  Notes by Page SpiroLiu, Hassel Uphoff S, MD at 10/31/18 0930                Author: Page SpiroLiu, Blonnie Maske S, MD  Service: --  Author Type: Physician       Filed: 10/31/18 1024  Encounter Date: 10/31/2018  Status: Signed          Editor: Page SpiroLiu, Raha Tennison S, MD (Physician)                         Assessment / Plan                  ICD-10-CM  ICD-9-CM             1.  Recurrent UTI  N39.0  599.0  CYSTOURETHROSCOPY                AMB POC URINALYSIS DIP STICK AUTO W/O MICRO           2.  BPH with obstruction/lower urinary tract symptoms  N40.1  600.01  CYSTOURETHROSCOPY            N13.8  599.69  AMB POC URINALYSIS DIP STICK AUTO W/O MICRO           3.  Frequency of micturition  R35.0  788.41  CYSTOURETHROSCOPY                AMB POC URINALYSIS DIP STICK AUTO W/O MICRO           4.  Urgency incontinence  N39.41  788.31  CYSTOURETHROSCOPY                AMB POC URINALYSIS DIP STICK AUTO W/O MICRO           5.  Nocturia  R35.1  788.43  CYSTOURETHROSCOPY                AMB POC URINALYSIS DIP STICK AUTO W/O MICRO        1.  Recurrent UTI    Urine cultures on 11/29/17, 05/24/18, 06/05/18 and 07/10/2018 all positive for Klebsiella pneumoniae    UCx 07/24/18: mixed urogenital flora   Negative RUS 08/02/18.   Diagnostic cystoscopy 10/24/18: mildly obstructive prostate.      2.  BPH with LUTS:  Frequency/nocturia.  Reports pushing fluids and on diuretics.  Declined medical therapy at this time.   Can observe for now.       F/u yearly              Chief Complaint       Patient presents with        ?  Recurrent UTI             Pt c/o back pain on his R side, states he does not have a hx of stones. On 6/15, states the pain was very unbearable, wanted to push his life alert.         ?  Cystoscopy          History of Present Illness:     Nicholas [name] is a 78 y.o.  BLACK [place] male  who presents today in follow up for recurrent UTI, BPH/LUTS.      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 Initially managed with Viagra 100 mg with good  response, but has become less effective over time. It appears that he was not taking medication  with an empty  stomach. Tried VED, but doesn't work well for him. Underwent PEP teaching with Nicholas [name] in 2017.  Currently not using.      Urine cultures on 11/29/17, 05/24/18, 06/05/18 and 07/10/2018 all positive for Klebsiella pneumonia      Urinary symptoms: nocturia x 3, daytime urinary frequency/urgency with some urgency incontinence.  Denies  urinary hesitancy or weak stream.  Some discomfort in penis during voiding  over past 9 months.  Patient reports pushing fluids and on diuretics for HTN.   Currently not on BPH med.      On cipro bid x 10 days per PCP (07/2018).      Interval History:   Presents today for RUS review and diagnostic cystoscopy to complete recurrent UTI workup. See separate procedure note.  Completed 10-day course of Cipro. No interval UTI.           No flowsheet data found.         Past Medical History:        Diagnosis  Date         ?  Arthropathy       ?  Cerebral artery occlusion with cerebral infarction [facility])       ?  Concussion with loss of consciousness of 30 minutes or less       ?  DM (diabetes mellitus) ([place])       ?  ED (erectile dysfunction)       ?  Gout       ?  HTN (hypertension)       ?  Hypercholesteremia       ?  Lower GI bleed           ?  PVD (peripheral vascular disease) ([place])            Past Surgical History:         Procedure  Laterality  Date          ?  HX HEMORRHOIDECTOMY    01/2014     ?  HX OTHER SURGICAL    02/23/2015          CUBITAL TUNNEL RELEASE          ?  HX POLYPECTOMY    05/16/2013          ?  HX SHOULDER REPLACEMENT              Family History         Problem  Relation  Age of Onset          ?  Heart Disease  Mother       ?  Hypertension  Mother       ?  Stroke  Father            ?  Breast Cancer  Sister            Social History          Socioeconomic History         ?  Marital status:  SINGLE              Spouse name:  Not on file         ?  Number of  children:  Not on file     ?  Years of education:  Not on file     ?  Highest education level:  Not on file       Occupational History        ?  Not on file       Social Needs         ?  Financial resource strain:  Not on file        ?  Food insecurity              Worry:  Not on file         Inability:  Not on file        ?  Transportation needs              Medical:  Not on file         Non-medical:  Not on file       Tobacco Use         ?  Smoking status:  Former Smoker              Packs/day:  0.50         Years:  10.00         Pack years:  5.00         Types:  Cigarettes         Last attempt to quit:  05/15/2009         Years since quitting:  9.4         ?  Smokeless tobacco:  Former NeurosurgeonUser              Quit date:  01/19/2010       Substance and Sexual Activity         ?  Alcohol use:  Yes     ?  Drug use:  No     ?  Sexual activity:  Not Currently              Partners:  Female             Comment: ISSUES WITH E.D.       Lifestyle        ?  Physical activity              Days per week:  Not on file         Minutes per session:  Not on file         ?  Stress:  Not on file       Relationships        ?  Social Engineer, manufacturing systemsconnections              Talks on phone:  Not on file         Gets together:  Not on file         Attends religious service:  Not on file         Active member of club or organization:  Not on file         Attends meetings of clubs or organizations:  Not on file         Relationship status:  Not on file        ?  Intimate partner violence              Fear of current or ex partner:  Not on file         Emotionally abused:  Not on file         Physically abused:  Not on file         Forced sexual activity:  Not on file        Other Topics  Concern        ?  Not on file       Social History Narrative        ?  Not on file          Current Outpatient Medications        Medication  Sig         ?  clopidogreL (PLAVIX) 75 mg tab       ?  amLODIPine (NORVASC) 5 mg tablet       ?  glipiZIDE (GLUCOTROL) 5 mg tablet        ?  ibuprofen (MOTRIN) 800 mg tablet       ?  hydroCHLOROthiazide (HYDRODIURIL) 25 mg tablet       ?  lisinopriL (PRINIVIL, ZESTRIL) 40 mg tablet       ?  metFORMIN (GLUCOPHAGE) 1,000 mg tablet           ?  temazepam (RESTORIL) 30 mg capsule           ?  traMADoL (ULTRAM) 50 mg tablet  Take 50 mg by mouth.     ?  Syringe with Needle, Disp, (ALLERGY SYRINGE) 1 mL 27 x 1/2" syrg  FOR USE WITH TRIMIX INTRAVERNOSAL INJECTIONS.     ?  OTHER,NON-FORMULARY,  1 mL by IntraCAVernosal route as needed.     ?  simvastatin (ZOCOR) 20 mg tablet  Take  by mouth nightly.         ?  budesonide-formoterol (SYMBICORT) 160-4.5 mcg/actuation HFA inhaler  Take 2 Puffs by inhalation two (2) times a day.          No current facility-administered medications for this visit.         No Known Allergies         Review of Systems   Constitutional: Fever: No   Skin: Rash: No   HEENT: Hearing difficulty: No   Eyes: Blurred vision: No   Cardiovascular: Chest pain: No   Respiratory: Shortness of breath: No   Gastrointestinal: Nausea/vomiting: No   Musculoskeletal: Back pain: Yes   Neurological: Weakness: No   Psychological: Memory loss: No   Comments/additional findings:             Physical Exam:        Estimated body mass index is 35.88 kg/m?? as calculated from the following:     Height as of this encounter:  (1.753 m).     Weight as of this encounter: 243 lb (110.2 kg).    Head: Normocephalic, without obvious abnormality, atraumatic   Eyes: negative   Neck: supple, symmetrical, trachea midline   Chest:  Normal respiratory effort and excursions.    Back: symmetric, no curvature. ROM normal. No CVA tenderness.   Abdomen: soft, non-tender. No masses,  no organomegaly   GU:  Normal external genitalia   DRE:  Deferred today.  Previously Normal sphincter tone, prostate 30 grams, no nodules or tenderness.   Extremities: extremities normal, atraumatic, no cyanosis or edema   Skin: Skin color, texture, turgor normal. No rashes or lesions    Neurologic: Grossly normal      LABS:     Results for orders placed or performed in visit on 10/31/18     AMB POC URINALYSIS DIP STICK AUTO W/O MICRO         Result  Value  Ref Range            Color (UA POC)  Yellow         Clarity (UA POC)  Clear         Glucose (UA POC)  Negative  Negative       Bilirubin (UA POC)  Negative  Negative       Ketones (UA POC)  Negative  Negative       Specific gravity (UA POC)  1.020  1.001 - 1.035       Blood (UA POC)  Negative  Negative       pH (UA POC)  5.5  4.6 - 8.0       Protein (UA POC)  Negative  Negative       Urobilinogen (UA POC)  1 mg/dL  0.2 - 1       Nitrites (UA POC)  Negative  Negative            Leukocyte esterase (UA POC)  Negative  Negative           PSA Trend:   No results found for: PSA, Sherle PoeSA2, PSAR1, PSA1, PSAR2, PSA3, PSAR3, [id number], [id number], PSALT, [id number], PSAEXT         IMAGING:      RUS 08/02/18   FINDINGS:  ??  RIGHT KIDNEY: 12.2 cm. No hydronephrosis or solid renal mass. No visible calculi. Normal renal echotexture and cortical thickness.   ??  LEFT KIDNEY: 12.9 cm. No hydronephrosis or solid renal mass. No visible calculi. Normal renal echotexture and cortical thickness.  ??  BLADDER: No intraluminal calculi or debris. No wall thickening. Normal bilateral ureteral jets.  Prevoid volume measures 94.9 cc.  ????  IMPRESSION  Unremarkable ultrasound of bilateral kidneys.      This note has been sent to the referring physician, with findings and recommendations .      Page SpiroJohn S. Nyasiah Moffet, MD, FACS      CC:   Doloris Hallallahan, Robert D, MD   [address]   [place] [id number]      Medical Documentation is provided with the assistance of Beaulah CorinXipi Amin, medical scribe for Page SpiroJohn S Matia Zelada, MD  on 10/31/2018.      Body mass index is 35.88 kg/m??. Patient's BMI is out of the normal parameters.  Information about BMI was given and patient was advised to  follow-up with their PCP for further management.

## 2018-10-31 NOTE — Procedures (Signed)
Cystoscopy     Patient: Nicholas [name]  Date: 10/31/2018      Patient placed in appropriate position and prepped/draped in sterile fashion, 10cc lidocaine gel placed into urethra    Consent:  All risks, benefits and options were reviewed in detail and the patient agrees to procedure. Risks include but are not limited to bleeding, infection, sepsis, death, dysuria and others.      Exam   Scope flexible   Lidocaine Jelly yes  Once adequate anesthesia was achieved; the flexible cystoscope was passed into the bladder under direct visualization   Meatus normal   Urethra normal   Prostate obstructing:  Mild   Bladder Neck elevated   Trigone Normal   Trabeculation 1+ trabeculation   Diverticuli no   Lesion none   Comment: none     Antibiotic/Meds none       Universal Procedure Pause:    1. Correct patient confirmed:  yes    2. Allergies confirmed:  yes    3. Patient taking antiplatelet medications:  no    4. Patient taking anticoagulants:  no    5. Correct procedure and side confirmed and consent signed:  yes    6. Correct antibiotics confirmed:  yes     This note has been sent to the referring physician, with findings and recommendations.    Nils Flack, MD, FACS    CC:  Joelene Millin, MD  [address]  [place] [address]    Medical Documentation is provided with the assistance of Urban Gibson, medical scribe for Urban Gibson on 10/31/2018.

## 2018-10-31 NOTE — Progress Notes (Signed)
Assessment / Plan       ICD-10-CM ICD-9-CM    1. Recurrent UTI N39.0 599.0 CYSTOURETHROSCOPY      AMB POC URINALYSIS DIP STICK AUTO W/O MICRO   2. BPH with obstruction/lower urinary tract symptoms N40.1 600.01 CYSTOURETHROSCOPY    N13.8 599.69 AMB POC URINALYSIS DIP STICK AUTO W/O MICRO   3. Frequency of micturition R35.0 788.41 CYSTOURETHROSCOPY      AMB POC URINALYSIS DIP STICK AUTO W/O MICRO   4. Urgency incontinence N39.41 788.31 CYSTOURETHROSCOPY      AMB POC URINALYSIS DIP STICK AUTO W/O MICRO   5. Nocturia R35.1 788.43 CYSTOURETHROSCOPY      AMB POC URINALYSIS DIP STICK AUTO W/O MICRO     1.  Recurrent UTI   Urine cultures on 11/29/17, 05/24/18, 06/05/18 and 07/10/2018 all positive for Klebsiella pneumoniae   UCx 07/24/18: mixed urogenital flora  Negative RUS 08/02/18.  Diagnostic cystoscopy 10/24/18: mildly obstructive prostate.    2.  BPH with LUTS:  Frequency/nocturia.  Reports pushing fluids and on diuretics.  Declined medical therapy at this time.   Can observe for now.     F/u yearly        Chief Complaint   Patient presents with   ??? Recurrent UTI     Pt c/o back pain on his R side, states he does not have a hx of stones. On 6/15, states the pain was very unbearable, wanted to push his life alert.    ??? Cystoscopy     History of Present Illness:   Nicholas [name] is a 78 y.o. BLACK OR [place] male who presents today in follow up for recurrent UTI, BPH/LUTS.    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 Initially managed with Viagra 100 mg with good response, but has become less effective over time. It appears that he was not taking medication with an empty stomach. Tried VED, but doesn't work well for him. Underwent PEP teaching with Donzetta Starchenise Chamberland in 2017.  Currently not using.    Urine cultures on 11/29/17, 05/24/18, 06/05/18 and 07/10/2018 all positive for Klebsiella pneumonia    Urinary symptoms: nocturia x 3, daytime urinary frequency/urgency with  some urgency incontinence.  Denies  urinary hesitancy or weak stream.  Some discomfort in penis during voiding over past 9 months.  Patient reports pushing fluids and on diuretics for HTN.  Currently not on BPH med.    On cipro bid x 10 days per PCP (07/2018).    Interval History:  Presents today for RUS review and diagnostic cystoscopy to complete recurrent UTI workup. See separate procedure note.  Completed 10-day course of Cipro. No interval UTI.        No flowsheet data found.     Past Medical History:   Diagnosis Date   ??? Arthropathy    ??? Cerebral artery occlusion with cerebral infarction [facility](HCC)    ??? Concussion with loss of consciousness of 30 minutes or less    ??? DM (diabetes mellitus) ([facility])    ??? ED (erectile dysfunction)    ??? Gout    ??? HTN (hypertension)    ??? Hypercholesteremia    ??? Lower GI bleed    ??? PVD (peripheral vascular disease) [facility](HCC)      Past Surgical History:   Procedure Laterality Date   ??? HX HEMORRHOIDECTOMY  01/2014   ??? HX OTHER SURGICAL  02/23/2015    CUBITAL TUNNEL RELEASE   ??? HX POLYPECTOMY  05/16/2013   ???  HX SHOULDER REPLACEMENT       Family History   Problem Relation Age of Onset   ??? Heart Disease Mother    ??? Hypertension Mother    ??? Stroke Father    ??? Breast Cancer Sister      Social History     Socioeconomic History   ??? Marital status: SINGLE     Spouse name: Not on file   ??? Number of children: Not on file   ??? Years of education: Not on file   ??? Highest education level: Not on file   Occupational History   ??? Not on file   Social Needs   ??? Financial resource strain: Not on file   ??? Food insecurity     Worry: Not on file     Inability: Not on file   ??? Transportation needs     Medical: Not on file     Non-medical: Not on file   Tobacco Use   ??? Smoking status: Former Smoker     Packs/day: 0.50     Years: 10.00     Pack years: 5.00     Types: Cigarettes     Last attempt to quit: 05/15/2009     Years since quitting: 9.4   ??? Smokeless tobacco: Former Systems developer     Quit date: 01/19/2010    Substance and Sexual Activity   ??? Alcohol use: Yes   ??? Drug use: No   ??? Sexual activity: Not Currently     Partners: Female     Comment: ISSUES WITH E.D.   Lifestyle   ??? Physical activity     Days per week: Not on file     Minutes per session: Not on file   ??? Stress: Not on file   Relationships   ??? Social Product manager on phone: Not on file     Gets together: Not on file     Attends religious service: Not on file     Active member of club or organization: Not on file     Attends meetings of clubs or organizations: Not on file     Relationship status: Not on file   ??? Intimate partner violence     Fear of current or ex partner: Not on file     Emotionally abused: Not on file     Physically abused: Not on file     Forced sexual activity: Not on file   Other Topics Concern   ??? Not on file   Social History Narrative   ??? Not on file     Current Outpatient Medications   Medication Sig   ??? clopidogreL (PLAVIX) 75 mg tab    ??? amLODIPine (NORVASC) 5 mg tablet    ??? glipiZIDE (GLUCOTROL) 5 mg tablet    ??? ibuprofen (MOTRIN) 800 mg tablet    ??? hydroCHLOROthiazide (HYDRODIURIL) 25 mg tablet    ??? lisinopriL (PRINIVIL, ZESTRIL) 40 mg tablet    ??? metFORMIN (GLUCOPHAGE) 1,000 mg tablet    ??? temazepam (RESTORIL) 30 mg capsule    ??? traMADoL (ULTRAM) 50 mg tablet Take 50 mg by mouth.   ??? Syringe with Needle, Disp, (ALLERGY SYRINGE) 1 mL 27 x 1/2" syrg FOR USE WITH TRIMIX INTRAVERNOSAL INJECTIONS.   ??? OTHER,NON-FORMULARY, 1 mL by IntraCAVernosal route as needed.   ??? simvastatin (ZOCOR) 20 mg tablet Take  by mouth nightly.   ??? budesonide-formoterol (SYMBICORT) 160-4.5 mcg/actuation HFA inhaler Take 2 Puffs  by inhalation two (2) times a day.     No current facility-administered medications for this visit.      No Known Allergies      Review of Systems  Constitutional: Fever: No  Skin: Rash: No  HEENT: Hearing difficulty: No  Eyes: Blurred vision: No  Cardiovascular: Chest pain: No  Respiratory: Shortness of breath: No   Gastrointestinal: Nausea/vomiting: No  Musculoskeletal: Back pain: Yes  Neurological: Weakness: No  Psychological: Memory loss: No  Comments/additional findings:        Physical Exam:     Estimated body mass index is 35.88 kg/m?? as calculated from the following:    Height as of this encounter: 5\' 9"  (1.753 m).    Weight as of this encounter: 243 lb (110.2 kg).   Head: Normocephalic, without obvious abnormality, atraumatic  Eyes: negative  Neck: supple, symmetrical, trachea midline  Chest:  Normal respiratory effort and excursions.   Back: symmetric, no curvature. ROM normal. No CVA tenderness.  Abdomen: soft, non-tender. No masses,  no organomegaly  GU:  Normal external genitalia  DRE:  Deferred today.  Previously Normal sphincter tone, prostate 30 grams, no nodules or tenderness.  Extremities: extremities normal, atraumatic, no cyanosis or edema  Skin: Skin color, texture, turgor normal. No rashes or lesions  Neurologic: Grossly normal    LABS:  Results for orders placed or performed in visit on 10/31/18   AMB POC URINALYSIS DIP STICK AUTO W/O MICRO   Result Value Ref Range    Color (UA POC) Yellow     Clarity (UA POC) Clear     Glucose (UA POC) Negative Negative    Bilirubin (UA POC) Negative Negative    Ketones (UA POC) Negative Negative    Specific gravity (UA POC) 1.020 1.001 - 1.035    Blood (UA POC) Negative Negative    pH (UA POC) 5.5 4.6 - 8.0    Protein (UA POC) Negative Negative    Urobilinogen (UA POC) 1 mg/dL 0.2 - 1    Nitrites (UA POC) Negative Negative    Leukocyte esterase (UA POC) Negative Negative       PSA Trend:  No results found for: PSA, Sherle PoeSA2, PSAR1, PSA1, PSAR2, PSA3, PSAR3, [id number], [id number], PSALT, [id number], PSAEXT      IMAGING:    RUS 08/02/18  FINDINGS:  ??  RIGHT KIDNEY: 12.2 cm. No hydronephrosis or solid renal mass. No visible calculi. Normal renal echotexture and cortical thickness.  ??  LEFT KIDNEY: 12.9 cm. No hydronephrosis or solid renal mass. No visible  calculi. Normal renal echotexture and cortical thickness.  ??  BLADDER: No intraluminal calculi or debris. No wall thickening. Normal bilateral ureteral jets. Prevoid volume measures 94.9 cc.  ????  IMPRESSION  Unremarkable ultrasound of bilateral kidneys.    This note has been sent to the referring physician, with findings and recommendations.    Page SpiroJohn S. Breaker Springer, MD, FACS    CC:  Doloris Hallallahan, Robert D, MD  [address]  [place] [id number]    Medical Documentation is provided with the assistance of Beaulah CorinXipi Amin, medical scribe for Page SpiroJohn S Jassmin Kemmerer, MD on 10/31/2018.    Body mass index is 35.88 kg/m??. Patient's BMI is out of the normal parameters.  Information about BMI was given and patient was advised to follow-up with their PCP for further management.

## 2018-10-31 NOTE — Procedures (Signed)
Cystoscopy     Patient: Nicholas [name]  Date: 10/31/2018      Patient placed in appropriate position and prepped/draped in sterile fashion, 10cc lidocaine gel placed into urethra    Consent:  All risks, benefits and options were reviewed in detail and the patient agrees to procedure. Risks include but are not limited to bleeding, infection, sepsis, death, dysuria and others.      Exam   Scope flexible   Lidocaine Jelly yes  Once adequate anesthesia was achieved; the flexible cystoscope was passed into the bladder under direct visualization   Meatus normal   Urethra normal   Prostate obstructing:  Mild   Bladder Neck elevated   Trigone Normal   Trabeculation 1+ trabeculation   Diverticuli no   Lesion none   Comment: none     Antibiotic/Meds none       Universal Procedure Pause:    1. Correct patient confirmed:  yes    2. Allergies confirmed:  yes    3. Patient taking antiplatelet medications:  no    4. Patient taking anticoagulants:  no    5. Correct procedure and side confirmed and consent signed:  yes    6. Correct antibiotics confirmed:  yes     This note has been sent to the referring physician, with findings and recommendations.    Samreet Edenfield S. Cylis Ayars, MD, FACS    CC:  Callahan, Robert D, MD  [address]  [place] [address]    Medical Documentation is provided with the assistance of Xipi Amin, medical scribe for Xipi Amin on 10/31/2018.

## 2019-11-03 ENCOUNTER — Encounter: Attending: Urology | Primary: Family Medicine

## 2019-11-03 ENCOUNTER — Encounter: Payer: MEDICARE | Attending: Urology | Primary: Family Medicine

## 2020-06-08 ENCOUNTER — Encounter (INDEPENDENT_AMBULATORY_CARE_PROVIDER_SITE_OTHER): Payer: Self-pay

## 2020-07-07 LAB — HM DIABETES EYE EXAM

## 2020-09-01 ENCOUNTER — Encounter (HOSPITAL_BASED_OUTPATIENT_CLINIC_OR_DEPARTMENT_OTHER): Payer: Self-pay | Admitting: Family Medicine

## 2020-09-01 ENCOUNTER — Other Ambulatory Visit: Payer: Self-pay

## 2020-09-01 ENCOUNTER — Ambulatory Visit (INDEPENDENT_AMBULATORY_CARE_PROVIDER_SITE_OTHER): Payer: Medicare Other | Admitting: Family Medicine

## 2020-09-01 VITALS — BP 164/80 | HR 73 | Ht 70.0 in | Wt 234.8 lb

## 2020-09-01 DIAGNOSIS — E119 Type 2 diabetes mellitus without complications: Secondary | ICD-10-CM | POA: Insufficient documentation

## 2020-09-01 DIAGNOSIS — E1169 Type 2 diabetes mellitus with other specified complication: Secondary | ICD-10-CM | POA: Diagnosis not present

## 2020-09-01 DIAGNOSIS — H3581 Retinal edema: Secondary | ICD-10-CM | POA: Diagnosis not present

## 2020-09-01 DIAGNOSIS — E785 Hyperlipidemia, unspecified: Secondary | ICD-10-CM

## 2020-09-01 DIAGNOSIS — I10 Essential (primary) hypertension: Secondary | ICD-10-CM

## 2020-09-01 DIAGNOSIS — Z6833 Body mass index (BMI) 33.0-33.9, adult: Secondary | ICD-10-CM

## 2020-09-01 DIAGNOSIS — E1142 Type 2 diabetes mellitus with diabetic polyneuropathy: Secondary | ICD-10-CM

## 2020-09-01 DIAGNOSIS — H33312 Horseshoe tear of retina without detachment, left eye: Secondary | ICD-10-CM

## 2020-09-01 DIAGNOSIS — H33319 Horseshoe tear of retina without detachment, unspecified eye: Secondary | ICD-10-CM | POA: Insufficient documentation

## 2020-09-01 DIAGNOSIS — E669 Obesity, unspecified: Secondary | ICD-10-CM

## 2020-09-01 NOTE — Progress Notes (Signed)
New Patient Office Visit  Subjective:  Patient ID: Kenneth Henson, male    DOB: [birth year]  Age: 80 y.o. MRN: [id number]  CC:  Chief Complaint  Patient presents with  . Establish Care    Pt just relocated to [facility] form [place] and needs a PCP along with is [facility].  . retina tear    Pt is having issues with eyes and was seeing a specialist in [place] and would like a referral to an Opthalmologist    HPI Kenneth Henson is an 80 year old male presenting to establish in clinic.  Current concerns today related to retinal issue for which she is requesting referral to ophthalmology.  Patient with past medical history significant for hypertension, diabetes, hyperlipidemia.  Patient moving to the area from [place] where his care was primarily through the [place].  He will also be establishing with the [place] in [place] locally.  Hypertension: Currently taking lisinopril and hydrochlorothiazide.  Chart review indicates amlodipine listed as medication, however patient reports that this is not a medication he has been taking.  Most recent office visit with prior PCP showed blood pressure of 138/80.  He denies any issues with chest pain, shortness of breath, lightheadedness, dizziness.  Does not check his blood pressure at home.  Diabetes: Reports being diagnosed with this about 15 years ago.  Only medication that he has been prescribed is metformin, he continues to take this.  Most recent labs were completed in February with prior PCP.  He is unsure of results.  Hyperlipidemia: Currently taking atorvastatin.  Denies any issues with myalgias or other side effects from medication.  Past Medical History:  Diagnosis Date  . Abnormal involuntary movements(781.0)   . Arthritis   . Chicken pox   . Claudication ([place])   . Colon polyps   . Diabetes mellitus type II   . Erectile dysfunction   . Hyperlipidemia   . Hypertension   . Neoplasm of skin    uncertain behavior    Past Surgical  History:  Procedure Laterality Date  . CARPAL TUNNEL RELEASE     right  . HAND SURGERY    . KNEE SURGERY    . NECK SURGERY      Family History  Problem Relation Age of Onset  . Hypertension Mother   . Heart disease Mother   . Stroke Father   . Colon cancer Neg Hx   . Stomach cancer Neg Hx   . Breast cancer Sister   . Colon polyps Sister     Social History   Socioeconomic History  . Marital status: Single    Spouse name: Not on file  . Number of children: Not on file  . Years of education: Not on file  . Highest education level: Not on file  Occupational History  . Not on file  Tobacco Use  . Smoking status: Former Smoker    Quit date: 05/15/2009    Years since quitting: 11.3  . Smokeless tobacco: Never Used  Substance and Sexual Activity  . Alcohol use: Yes    Alcohol/week: 2.0 standard drinks    Types: 1 Glasses of wine, 1 Cans of beer per week    Comment: social  . Drug use: No  . Sexual activity: Not on file  Other Topics Concern  . Not on file  Social History Narrative  . Not on file   Social Determinants of Health   Financial Resource Strain: Not on file  Food  Insecurity: Not on file  Transportation Needs: Not on file  Physical Activity: Not on file  Stress: Not on file  Social Connections: Not on file  Intimate Partner Violence: Not on file    Objective:   Today's Vitals: BP (!) 164/80   Pulse 73   Ht 5\' 10"  (1.778 m)   Wt 234 lb 12.8 oz (106.5 kg)   SpO2 99%   BMI 33.69 kg/m   Physical Exam  Pleasant 80 year old male in no acute distress Cardiovascular exam with regular rate and rhythm, no murmurs appreciated Lungs clear to auscultation bilaterally  Assessment & Plan:   Problem List Items Addressed This Visit      Cardiovascular and Mediastinum   Essential hypertension    Chronic, elevated in the office today, however patient has not taken blood pressure medications for today Review of office visit with prior PCP indicates blood  pressure was at goal at that time Will continue with lisinopril and hydrochlorothiazide Review of note from prior PCP shows prescription for amlodipine, however patient denies being on this medication Will request lab report for labs completed with prior PCP in February      Relevant Medications   hydrochlorothiazide (HYDRODIURIL) 25 MG tablet     Endocrine   Diabetes ([place])    Chronic, uncertain of control, will request results of labs completed recently with prior PCP At this time, continue with metformin Continue with lifestyle modifications Foot exam completed today Will need to assess for vaccine need at subsequent visit      RESOLVED: Hyperlipidemia associated with type 2 diabetes mellitus ([place])     Other   OBESITY    Focus on lifestyle modifications, ensuring healthy diet, activity as tolerated Monitor at future visits      Retinal tear    Was being followed by ophthalmology before moving to the area Will refer to local ophthalmologist for continued evaluation and management       Other Visit Diagnoses    Retinal edema of left eye    -  Primary   Relevant Orders   Ambulatory referral to Ophthalmology    Reviewed office visit notes from prior PCP as well as specialist including ophthalmology and orthopedic surgery.  Records to be scanned into chart.  Outpatient Encounter Medications as of 09/01/2020  Medication Sig  . allopurinol (ZYLOPRIM) 300 MG tablet Take 300 mg by mouth daily.  Marland Kitchen aspirin 81 MG tablet Take 81 mg by mouth daily.  . hydrochlorothiazide (HYDRODIURIL) 25 MG tablet Take 25 mg by mouth daily.  Marland Kitchen lisinopril (PRINIVIL,ZESTRIL) 40 MG tablet Take 1 tablet (40 mg total) by mouth daily.  . metFORMIN (GLUCOPHAGE) 850 MG tablet Take 1 tablet (850 mg total) by mouth daily.  . ONE TOUCH ULTRA TEST test strip USE AS DIRECTED  . ONETOUCH DELICA LANCETS MISC by Does not apply route 2 (two) times daily.  . sildenafil (VIAGRA) 100 MG tablet Take 1 tablet (100 mg  total) by mouth as needed for erectile dysfunction.  . simvastatin (ZOCOR) 20 MG tablet Take 1 tablet (20 mg total) by mouth daily.  . temazepam (RESTORIL) 30 MG capsule Take 1 capsule (30 mg total) by mouth at bedtime as needed.  . traMADol (ULTRAM) 50 MG tablet Take 1 tablet (50 mg total) by mouth every 6 (six) hours as needed.  . [DISCONTINUED] hydrochlorothiazide (MICROZIDE) 12.5 MG capsule Take 1 capsule (12.5 mg total) by mouth daily.  . [DISCONTINUED] tadalafil (CIALIS) 5 MG tablet daily as needed. 1  tab daily (Patient not taking: Reported on 09/01/2020)   No facility-administered encounter medications on file as of 09/01/2020.   Spent 70 minutes on this patient encounter, including preparation, chart review, face-to-face counseling with patient and coordination of care, and documentation of encounter  Follow-up: Return in about 3 months (around 12/01/2020) for Follow Up.   July Linam J De [place]am, MD

## 2020-09-01 NOTE — Assessment & Plan Note (Signed)
Was being followed by ophthalmology before moving to the area Will refer to local ophthalmologist for continued evaluation and management

## 2020-09-01 NOTE — Assessment & Plan Note (Signed)
Focus on lifestyle modifications, ensuring healthy diet, activity as tolerated Monitor at future visits

## 2020-09-01 NOTE — Assessment & Plan Note (Signed)
Chronic, elevated in the office today, however patient has not taken blood pressure medications for today Review of office visit with prior PCP indicates blood pressure was at goal at that time Will continue with lisinopril and hydrochlorothiazide Review of note from prior PCP shows prescription for amlodipine, however patient denies being on this medication Will request lab report for labs completed with prior PCP in February

## 2020-09-01 NOTE — Assessment & Plan Note (Addendum)
Chronic, uncertain of control, will request results of labs completed recently with prior PCP At this time, continue with metformin Continue with lifestyle modifications Foot exam completed today Will need to assess for vaccine need at subsequent visit

## 2020-09-01 NOTE — Patient Instructions (Addendum)
  Medication Instructions:  Your physician recommends that you continue on your current medications as directed. Please refer to the Current Medication list given to you today. --If you need a refill on any your medications before your next appointment, please call your pharmacy first. If no refills are authorized on file call the office.--  Referrals/Procedures/Imaging: A referral has been placed for you to Dr. Deloria Lair at the [facility]. Someone from the scheduling department will be in contact with you in regards to coordinating your consultation. If you do not hear from any of the schedulers within 7-10 business days please give our office a call.  Follow-Up: Your next appointment:   Your physician recommends that you schedule a follow-up appointment in: 2-3 MONTHS with Dr. de [place]am.  Thanks for letting us be apart of your health journey!!  Primary Care and Sports Medicine   Dr. de [place] and Worthy Keeler, DNP, AGNP  We recommend signing up for the patient portal called "MyChart".  Sign up information is provided on this After Visit Summary.  MyChart is used to connect with patients for Virtual Visits (Telemedicine).  Patients are able to view lab/test results, encounter notes, upcoming appointments, etc.  Non-urgent messages can be sent to your provider as well.   To learn more about what you can do with MyChart, go to [email or web address].

## 2020-10-27 ENCOUNTER — Other Ambulatory Visit: Payer: Self-pay

## 2020-10-27 ENCOUNTER — Encounter (INDEPENDENT_AMBULATORY_CARE_PROVIDER_SITE_OTHER): Payer: Self-pay | Admitting: Ophthalmology

## 2020-10-27 ENCOUNTER — Ambulatory Visit (INDEPENDENT_AMBULATORY_CARE_PROVIDER_SITE_OTHER): Payer: Medicare Other | Admitting: Ophthalmology

## 2020-10-27 DIAGNOSIS — E119 Type 2 diabetes mellitus without complications: Secondary | ICD-10-CM | POA: Insufficient documentation

## 2020-10-27 DIAGNOSIS — H353133 Nonexudative age-related macular degeneration, bilateral, advanced atrophic without subfoveal involvement: Secondary | ICD-10-CM

## 2020-10-27 DIAGNOSIS — H43823 Vitreomacular adhesion, bilateral: Secondary | ICD-10-CM

## 2020-10-27 DIAGNOSIS — H353221 Exudative age-related macular degeneration, left eye, with active choroidal neovascularization: Secondary | ICD-10-CM

## 2020-10-27 DIAGNOSIS — H33319 Horseshoe tear of retina without detachment, unspecified eye: Secondary | ICD-10-CM

## 2020-10-27 NOTE — Progress Notes (Signed)
10/27/2020     CHIEF COMPLAINT Patient presents for Diabetic Eye Exam (NP- Diabetic Eye Exam- Referred by Dr. Alfonso Patten. [place]/Pt states, "I have had cat sx OU and lately I am having more issues with my left eye. I have noticed if I cover my OD then there is a large black spot in the center of my vision OS. Also, I have several new floaters."/Currently on ARED therapy/A1C; 7.0/LBS: unknown, does not check regularly/)   HISTORY OF PRESENT ILLNESS: Kenneth Henson is a 80 y.o. male who presents to the clinic today for:   HPI     Diabetic Eye Exam           Vision: is blurred for distance   Associated Symptoms: Floaters (In the last month multiple floaters, unsure which eye) and Blind Spot (I have a black spot in the center of my vision OS).  Negative for Distortion   Diabetes Type: Type 2 and taking oral medications   Onset: 10 years ago   Blood Sugars: is controlled   Last A1C: 7   Comments: NP- Diabetic Eye Exam- Referred by Dr. Alfonso Patten. [place] Pt states, "I have had cat sx OU and lately I am having more issues with my left eye. I have noticed if I cover my OD then there is a large black spot in the center of my vision OS. Also, I have several new floaters." Currently on ARED therapy A1C; 7.0 LBS: unknown, does not check regularly        Last edited by Kendra Opitz, COA on 10/27/2020  9:58 AM.      Referring physician: de [place], [name], MD [place],  [place] [id number]  HISTORICAL INFORMATION:   Selected notes from the MEDICAL RECORD NUMBER    Lab Results  Component Value Date   HGBA1C 5.8 02/24/2011     CURRENT MEDICATIONS: No current outpatient medications on file. (Ophthalmic Drugs)   No current facility-administered medications for this visit. (Ophthalmic Drugs)   Current Outpatient Medications (Other)  Medication Sig   allopurinol (ZYLOPRIM) 300 MG tablet Take 300 mg by mouth daily.   aspirin 81 MG tablet Take 81 mg by mouth daily.    hydrochlorothiazide (HYDRODIURIL) 25 MG tablet Take 25 mg by mouth daily.   lisinopril (PRINIVIL,ZESTRIL) 40 MG tablet Take 1 tablet (40 mg total) by mouth daily.   metFORMIN (GLUCOPHAGE) 850 MG tablet Take 1 tablet (850 mg total) by mouth daily.   ONE TOUCH ULTRA TEST test strip USE AS DIRECTED   ONETOUCH DELICA LANCETS MISC by Does not apply route 2 (two) times daily.   sildenafil (VIAGRA) 100 MG tablet Take 1 tablet (100 mg total) by mouth as needed for erectile dysfunction.   simvastatin (ZOCOR) 20 MG tablet Take 1 tablet (20 mg total) by mouth daily.   temazepam (RESTORIL) 30 MG capsule Take 1 capsule (30 mg total) by mouth at bedtime as needed.   traMADol (ULTRAM) 50 MG tablet Take 1 tablet (50 mg total) by mouth every 6 (six) hours as needed.   No current facility-administered medications for this visit. (Other)      REVIEW OF SYSTEMS:    ALLERGIES No Known Allergies  PAST MEDICAL HISTORY Past Medical History:  Diagnosis Date   Abnormal involuntary movements(781.0)    Arthritis    Chicken pox    Claudication ([place])    Colon polyps    Diabetes mellitus type II    Erectile dysfunction  Hyperlipidemia    Hypertension    Neoplasm of skin    uncertain behavior   Past Surgical History:  Procedure Laterality Date   CARPAL TUNNEL RELEASE     right   HAND SURGERY     KNEE SURGERY     NECK SURGERY      FAMILY HISTORY Family History  Problem Relation Age of Onset   Hypertension Mother    Heart disease Mother    Stroke Father    Colon cancer Neg Hx    Stomach cancer Neg Hx    Breast cancer Sister    Colon polyps Sister     SOCIAL HISTORY Social History   Tobacco Use   Smoking status: Former    Pack years: 0.00    Types: Cigarettes    Quit date: 05/15/2009    Years since quitting: 11.4   Smokeless tobacco: Never  Substance Use Topics   Alcohol use: Yes    Alcohol/week: 2.0 standard drinks    Types: 1 Glasses of wine, 1 Cans of beer per week    Comment:  social   Drug use: No         OPHTHALMIC EXAM:  Base Eye Exam     Visual Acuity (ETDRS)       Right Left   Dist cc 20/30 CF at 3'   Dist ph cc NI     Correction: Glasses         Tonometry (Tonopen, 10:04 AM)       Right Left   Pressure 13 11         Pupils       Pupils Dark Light Shape React APD   Right PERRL 3 2 Round Brisk None   Left PERRL 3 2 Round Brisk +1         Visual Fields (Counting fingers)       Left Right    Full Full         Extraocular Movement       Right Left    Full Full         Neuro/Psych     Oriented x3: Yes         Dilation     Both eyes: 1.0% Mydriacyl, 2.5% Phenylephrine @ 10:04 AM           Slit Lamp and Fundus Exam     External Exam       Right Left   External Normal Normal         Slit Lamp Exam       Right Left   Lids/Lashes Normal Normal   Conjunctiva/Sclera White and quiet White and quiet   Cornea Clear Clear   Anterior Chamber Deep and quiet Deep and quiet   Iris Round and reactive Round and reactive   Lens Clear Clear   Anterior Vitreous Normal Normal         Fundus Exam       Right Left   Posterior Vitreous Normal Normal   Disc Normal Normal   C/D Ratio 0.2 0.2   Macula Early age related macular degeneration, Hard drusen Retinal pigment epithelial mottling, Disciform scar, Intermediate age related macular degeneration   Vessels Normal, no DR Normal, no DR   Periphery Normal Normal            IMAGING AND PROCEDURES  Imaging and Procedures for 10/27/20  OCT, Retina - OU - Both Eyes       Right  Eye Quality was good. Scan locations included subfoveal. Central Foveal Thickness: 212. Progression has no prior data. Findings include no SRF, no IRF, retinal drusen , vitreomacular adhesion .   Left Eye Quality was good. Scan locations included subfoveal. Central Foveal Thickness: 223. Progression has no prior data. Findings include vitreomacular adhesion , no IRF, no SRF,  inner retinal atrophy, outer retinal atrophy.   Notes OD no signs of CNVM, OS with outer retinal scarring, subfoveal disciform type scarring, no active intraretinal fluid.  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  Patient should resume under their care completely  Retinal tear No data on this available     ICD-10-CM   1. Diabetes mellitus without complication (HCC)  A83.4     2. Vitreomacular adhesion of both eyes  H43.823 OCT, Retina - OU - Both Eyes    3. Exudative age-related macular degeneration of left eye with active choroidal neovascularization (HCC)  H35.3221 OCT, Retina - OU - Both Eyes    4. 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 Retinal tear, unspecified laterality  H33.319       1.  No detectable diabetic retinopathy in either eye  2.  OU with ARMD, with complications in the left eye with what looks like of actually a stable macular scar in the left eye with no disease activity.  Currently unknown interval of therapy yet patient scheduled to have  therapy injection intravitreal elsewhere next week  3.  Patient should resume with [place] retina care as currently scheduled.    #4 patient always has the option to change and to see any eye doctor or other doctor of his choice  Ophthalmic Meds Ordered this visit:  No orders of the defined types were placed in this encounter.      Return if symptoms worsen or fail to improve, for As patient under the care of other retinal specialist nearby.  There are no Patient Instructions on file for this visit.   Explained the diagnoses, plan, and follow up with the patient and they expressed understanding.  Patient expressed understanding of the importance of proper follow up care.   Clent Demark Amadea Keagy M.D. Diseases & Surgery of the Retina and Vitreous [facility] [place] 10/27/20     Abbreviations: M myopia (nearsighted); A astigmatism; H hyperopia (farsighted); P presbyopia; Mrx spectacle prescription;  CTL contact lenses; OD right eye; OS left eye; OU both eyes  XT exotropia; ET esotropia; PEK punctate epithelial keratitis; PEE punctate epithelial erosions; DES dry eye syndrome; MGD meibomian gland dysfunction; ATs artificial tears; PFAT's preservative free artificial tears; [place] nuclear sclerotic cataract; PSC posterior subcapsular cataract;  ERM epi-retinal membrane; PVD posterior vitreous detachment; RD retinal detachment; DM diabetes mellitus; DR diabetic retinopathy; NPDR non-proliferative diabetic retinopathy; PDR proliferative diabetic retinopathy; CSME clinically significant macular edema; DME diabetic macular edema; dbh dot blot hemorrhages; CWS cotton wool spot; POAG primary open angle glaucoma; C/D cup-to-disc ratio; HVF humphrey visual field; GVF goldmann visual field; OCT optical coherence tomography; IOP intraocular pressure; BRVO Branch retinal vein occlusion; CRVO central retinal vein occlusion; CRAO central retinal artery occlusion; BRAO branch retinal artery occlusion; RT  retinal tear; SB scleral buckle; PPV pars plana vitrectomy; VH Vitreous hemorrhage; PRP panretinal laser photocoagulation; IVK intravitreal kenalog; VMT vitreomacular traction; MH Macular hole;  NVD neovascularization of the disc; NVE neovascularization elsewhere; AREDS age related eye disease study; ARMD age related macular degeneration; POAG primary open angle glaucoma; EBMD epithelial/anterior basement membrane dystrophy; ACIOL anterior chamber intraocular lens; IOL intraocular lens; PCIOL posterior chamber intraocular lens; Phaco/IOL phacoemulsification with intraocular lens placement; Impact photorefractive keratectomy; LASIK laser assisted in situ keratomileusis; HTN hypertension; DM diabetes mellitus; COPD chronic obstructive pulmonary disease

## 2020-10-27 NOTE — Assessment & Plan Note (Signed)
Under current therapy nearby with retina service likely [place] on [place] with planned injection patient reports next week left eye.  Patient should resume under their care completely

## 2020-10-27 NOTE — Assessment & Plan Note (Addendum)
Continue with excellent blood sugar control

## 2020-10-27 NOTE — Assessment & Plan Note (Signed)
Physiologic OU, no macular or foveal distortion observed

## 2020-10-27 NOTE — Assessment & Plan Note (Signed)
No data on this available

## 2020-10-27 NOTE — Assessment & Plan Note (Signed)
Significant drusen in each eye, atrophy of the left eye is on the basis of scarring from CNVM

## 2020-12-01 ENCOUNTER — Ambulatory Visit (INDEPENDENT_AMBULATORY_CARE_PROVIDER_SITE_OTHER): Payer: Medicare Other | Admitting: Family Medicine

## 2020-12-01 ENCOUNTER — Encounter (HOSPITAL_BASED_OUTPATIENT_CLINIC_OR_DEPARTMENT_OTHER): Payer: Self-pay | Admitting: Family Medicine

## 2020-12-01 ENCOUNTER — Other Ambulatory Visit: Payer: Self-pay

## 2020-12-01 DIAGNOSIS — L089 Local infection of the skin and subcutaneous tissue, unspecified: Secondary | ICD-10-CM | POA: Diagnosis not present

## 2020-12-01 DIAGNOSIS — H547 Unspecified visual loss: Secondary | ICD-10-CM | POA: Diagnosis not present

## 2020-12-01 DIAGNOSIS — R809 Proteinuria, unspecified: Secondary | ICD-10-CM | POA: Insufficient documentation

## 2020-12-01 NOTE — Assessment & Plan Note (Signed)
Reviewed recent labs brought in by patient.  He had labs completed through the [place] in April On labs it was noted that patient had moderately increased albuminuria, likely related to underlying diabetes For monitoring/confirmation of diagnosis, will need repeat urine microalbumin/creatinine ratio completed over the next 3 to 6 months Patient currently on ACE inhibitor more so for treatment of hypertension but is beneficial when albuminuria is present in setting of diabetes, continue this medication

## 2020-12-01 NOTE — Assessment & Plan Note (Signed)
Patient presents with bottle of doxycycline tablets.  He reports that this was prescribed by prior physician in [place].  Reportedly was prescribed to help with pustular lesions that he will get along hairline over neck.  Indicates that this occasionally flares up with lesions and he has been instructed to take the doxycycline twice a day for 7 days when this occurs.  He is requesting a new medication to treat this condition as he has not been tolerating the doxycycline.  Reports that he will get some GI upset, nausea, occasional vomiting. Examination of the posterior hairline reveals intermittent pustular lesions, some are crusted over, some subcutaneous swelling that appears to be fairly soft, possibly consistent with lipoma versus fluid collection Given long history of problem and intolerance to doxycycline, will refer to dermatology for further evaluation and treatment recommendations

## 2020-12-01 NOTE — Patient Instructions (Addendum)
  Medication Instructions:  Your physician recommends that you continue on your current medications as directed. Please refer to the Current Medication list given to you today. --If you need a refill on any your medications before your next appointment, please call your pharmacy first. If no refills are authorized on file call the office.--  Referrals/Procedures/Imaging: A referral has been placed for you to Optometry for evaluation and treatment. Someone from the scheduling department will be in contact with you in regards to coordinating your consultation. If you do not hear from any of the schedulers within 7-10 business days please give our office a call.   A referral has been placed for you to Dermatology for evaluation and treatment. Someone from the scheduling department will be in contact with you in regards to coordinating your consultation. If you do not hear from any of the schedulers within 7-10 business days please give our office a call.   Follow-Up: Your next appointment:   Your physician recommends that you schedule a follow-up appointment in: October with Dr. de [place]  Thanks for letting us be apart of your health journey!!  Primary Care and Sports Medicine   Dr. Arlina Robes [place]   We encourage you to activate your patient portal called "MyChart".  Sign up information is provided on this After Visit Summary.  MyChart is used to connect with patients for Virtual Visits (Telemedicine).  Patients are able to view lab/test results, encounter notes, upcoming appointments, etc.  Non-urgent messages can be sent to your provider as well. To learn more about what you can do with MyChart, please visit --  [email or web address].

## 2020-12-01 NOTE — Assessment & Plan Note (Signed)
Patient has noticed some decreased in visual acuity.  He does follow with retinal specialist who indicated that he will need to be seen by an optometrist for vision testing to determine if new prescription is needed for glasses Referral placed today to optometrist for eye exam and assessment for any change in prescription is required

## 2020-12-01 NOTE — Progress Notes (Signed)
    Procedures performed today:    None.  Independent interpretation of notes and tests performed by another provider:   None.  Brief History, Exam, Impression, and Recommendations:    BP 120/74   Pulse 72   Ht 6' (1.829 m)   Wt 233 lb (105.7 kg)   SpO2 96%   BMI 31.60 kg/m   Pustular inflammation of skin Patient presents with bottle of doxycycline tablets.  He reports that this was prescribed by prior physician in [place].  Reportedly was prescribed to help with pustular lesions that he will get along hairline over neck.  Indicates that this occasionally flares up with lesions and he has been instructed to take the doxycycline twice a day for 7 days when this occurs.  He is requesting a new medication to treat this condition as he has not been tolerating the doxycycline.  Reports that he will get some GI upset, nausea, occasional vomiting. Examination of the posterior hairline reveals intermittent pustular lesions, some are crusted over, some subcutaneous swelling that appears to be fairly soft, possibly consistent with lipoma versus fluid collection Given long history of problem and intolerance to doxycycline, will refer to dermatology for further evaluation and treatment recommendations  Decreased visual acuity Patient has noticed some decreased in visual acuity.  He does follow with retinal specialist who indicated that he will need to be seen by an optometrist for vision testing to determine if new prescription is needed for glasses Referral placed today to optometrist for eye exam and assessment for any change in prescription is required  Moderately increased albuminuria Reviewed recent labs brought in by patient.  He had labs completed through the [place] in April On labs it was noted that patient had moderately increased albuminuria, likely related to underlying diabetes For monitoring/confirmation of diagnosis, will need repeat urine microalbumin/creatinine ratio completed over  the next 3 to 6 months Patient currently on ACE inhibitor more so for treatment of hypertension but is beneficial when albuminuria is present in setting of diabetes, continue this medication  Spent 30 minutes on this patient encounter, including preparation, chart review, face-to-face counseling with patient and coordination of care, and documentation of encounter  Plan for follow-up in about 3 months or sooner as needed.  Discuss recommended vaccinations at next appointment   ___________________________________________ Nishaan Stanke de [place], MD, ABFM, [facility] and [place]

## 2021-03-01 ENCOUNTER — Ambulatory Visit (INDEPENDENT_AMBULATORY_CARE_PROVIDER_SITE_OTHER): Payer: Medicare Other | Admitting: Family Medicine

## 2021-03-01 ENCOUNTER — Other Ambulatory Visit: Payer: Self-pay

## 2021-03-01 ENCOUNTER — Encounter (HOSPITAL_BASED_OUTPATIENT_CLINIC_OR_DEPARTMENT_OTHER): Payer: Self-pay | Admitting: Family Medicine

## 2021-03-01 VITALS — BP 132/74 | HR 67 | Ht 72.0 in | Wt 236.0 lb

## 2021-03-01 DIAGNOSIS — L089 Local infection of the skin and subcutaneous tissue, unspecified: Secondary | ICD-10-CM

## 2021-03-01 DIAGNOSIS — R809 Proteinuria, unspecified: Secondary | ICD-10-CM

## 2021-03-01 DIAGNOSIS — E1142 Type 2 diabetes mellitus with diabetic polyneuropathy: Secondary | ICD-10-CM

## 2021-03-01 DIAGNOSIS — I10 Essential (primary) hypertension: Secondary | ICD-10-CM | POA: Diagnosis not present

## 2021-03-01 DIAGNOSIS — Z23 Encounter for immunization: Secondary | ICD-10-CM

## 2021-03-01 LAB — POCT UA - MICROALBUMIN
Albumin/Creatinine Ratio, Urine, POC: <30
Creatinine, POC: 200 mg/dL
Microalbumin Ur, POC: 30 mg/L

## 2021-03-01 LAB — POCT GLYCOSYLATED HEMOGLOBIN (HGB A1C)
HbA1c POC (<> result, manual entry): 5.5 % (ref 4.0–5.6)
Hemoglobin A1C: 5.5 % (ref 4.0–5.6)

## 2021-03-01 MED ORDER — IBUPROFEN 800 MG PO TABS: 800.0000 mg | ORAL_TABLET | Freq: Four times a day (QID) | ORAL | 1 refills | Status: DC | PRN

## 2021-03-01 NOTE — Progress Notes (Signed)
    Procedures performed today:    None.  Independent interpretation of notes and tests performed by another provider:   None.  Brief History, Exam, Impression, and Recommendations:    BP 132/74   Pulse 67   Ht 6' (1.829 m)   Wt 236 lb (107 kg)   SpO2 98%   BMI 32.01 kg/m   Diabetes (HCC) Continues with metformin Most recent hemoglobin A1c was 6.3%, this was checked at the [place], lab report scanned into chart Due for recheck of A1c, will complete today Did have moderately increased albuminuria on prior labs at [facility], due for recheck to assess for persistence of this, will check today Continues to follow-up with ophthalmologist  Pustular inflammation of skin Has been seen by dermatology, was prescribed treatment and has been doing well  Need for influenza vaccination Patient interested in influenza vaccination today, discussed recommendations for this Influenza vaccine administered today  Moderately increased albuminuria Noted on prior labs at [facility], due for recheck Urine microalbumin/creatinine ratio checked in office today  Essential hypertension Currently taking hydrochlorothiazide, lisinopril Denies any issues with chest pain, headaches, lightheadedness or dizziness Blood pressure at goal in office today Continue with current medication regimen  Plan for follow-up in 3 to 4 months or sooner as needed.  Follow-up on chronic medical conditions including hypertension, diabetes   ___________________________________________ Kenneth Henson de [place], MD, ABFM, [facility] and [place]

## 2021-03-01 NOTE — Assessment & Plan Note (Signed)
Has been seen by dermatology, was prescribed treatment and has been doing well

## 2021-03-01 NOTE — Assessment & Plan Note (Signed)
Patient interested in influenza vaccination today, discussed recommendations for this Influenza vaccine administered today

## 2021-03-01 NOTE — Assessment & Plan Note (Signed)
Currently taking hydrochlorothiazide, lisinopril Denies any issues with chest pain, headaches, lightheadedness or dizziness Blood pressure at goal in office today Continue with current medication regimen

## 2021-03-01 NOTE — Assessment & Plan Note (Signed)
Noted on prior labs at [facility], due for recheck Urine microalbumin/creatinine ratio checked in office today

## 2021-03-01 NOTE — Patient Instructions (Signed)
  Medication Instructions:  Your physician recommends that you continue on your current medications as directed. Please refer to the Current Medication list given to you today. --If you need a refill on any your medications before your next appointment, please call your pharmacy first. If no refills are authorized on file call the office.-- Lab Work: Your physician has recommended that you have lab work today: A1C and Urine Microalbumin If you have labs (blood work) drawn today and your tests are completely normal, you will receive your results via [place] a phone call from our staff.  Please ensure you check your voicemail in the event that you authorized detailed messages to be left on a delegated number. If you have any lab test that is abnormal or we need to change your treatment, we will call you to review the results.  Follow-Up: Your next appointment:   Your physician recommends that you schedule a follow-up appointment in: 3-4 MONTHS with Dr. de [place]  You will receive a text message or e-mail with a link to a survey about your care and experience with [place] today! We would greatly appreciate your feedback!   Thanks for letting us be apart of your health journey!!  Primary Care and Sports Medicine   Dr. Arlina Robes [place]   We encourage you to activate your patient portal called "MyChart".  Sign up information is provided on this After Visit Summary.  MyChart is used to connect with patients for Virtual Visits (Telemedicine).  Patients are able to view lab/test results, encounter notes, upcoming appointments, etc.  Non-urgent messages can be sent to your provider as well. To learn more about what you can do with MyChart, please visit --  [email or web address].

## 2021-03-01 NOTE — Assessment & Plan Note (Signed)
Continues with metformin Most recent hemoglobin A1c was 6.3%, this was checked at the [place], lab report scanned into chart Due for recheck of A1c, will complete today Did have moderately increased albuminuria on prior labs at [facility], due for recheck to assess for persistence of this, will check today Continues to follow-up with ophthalmologist

## 2021-03-02 ENCOUNTER — Telehealth (HOSPITAL_BASED_OUTPATIENT_CLINIC_OR_DEPARTMENT_OTHER): Payer: Self-pay

## 2021-03-02 ENCOUNTER — Ambulatory Visit (HOSPITAL_BASED_OUTPATIENT_CLINIC_OR_DEPARTMENT_OTHER): Payer: Medicare Other | Admitting: Family Medicine

## 2021-03-02 NOTE — Telephone Encounter (Signed)
-----   Message from [name] de [place], MD sent at 03/01/2021  4:45 PM EDT ----- Hemoglobin A1c is 5.5%.  This is improved from prior reading of 6.3%.  Recommend continue to monitor with recheck in 3 to 6 months.

## 2021-03-02 NOTE — Telephone Encounter (Signed)
Patient is aware and agreeable to lab results and recommendations

## 2021-07-05 ENCOUNTER — Other Ambulatory Visit: Payer: Self-pay

## 2021-07-05 ENCOUNTER — Encounter (HOSPITAL_BASED_OUTPATIENT_CLINIC_OR_DEPARTMENT_OTHER): Payer: Self-pay | Admitting: Family Medicine

## 2021-07-05 ENCOUNTER — Ambulatory Visit (INDEPENDENT_AMBULATORY_CARE_PROVIDER_SITE_OTHER): Payer: Medicare Other | Admitting: Family Medicine

## 2021-07-05 VITALS — BP 122/72 | HR 74 | Ht 69.0 in | Wt 242.6 lb

## 2021-07-05 DIAGNOSIS — E1142 Type 2 diabetes mellitus with diabetic polyneuropathy: Secondary | ICD-10-CM | POA: Diagnosis not present

## 2021-07-05 DIAGNOSIS — R809 Proteinuria, unspecified: Secondary | ICD-10-CM

## 2021-07-05 DIAGNOSIS — Z23 Encounter for immunization: Secondary | ICD-10-CM | POA: Diagnosis not present

## 2021-07-05 DIAGNOSIS — I10 Essential (primary) hypertension: Secondary | ICD-10-CM | POA: Diagnosis not present

## 2021-07-05 MED ORDER — IBUPROFEN 800 MG PO TABS: 800.0000 mg | ORAL_TABLET | Freq: Three times a day (TID) | ORAL | 1 refills | Status: DC | PRN

## 2021-07-05 NOTE — Progress Notes (Signed)
° ° °  Procedures performed today:    None.  Independent interpretation of notes and tests performed by another provider:   None.  Brief History, Exam, Impression, and Recommendations:    BP 122/72    Pulse 74    Ht 5\' 9"  (1.753 m)    Wt 242 lb 9.6 oz (110 kg)    SpO2 92%    BMI 35.83 kg/m   Essential hypertension Blood pressure at goal in office today Continues with hydrochlorothiazide, lisinopril, tolerating well Denies any issues with chest pain, lightheadedness or dizziness Recommend intermittent monitoring of blood pressure at home, DASH diet  Diabetes (HCC) Last hemoglobin A1c was 5.5%, indicating diabetes likely in remission at present.  He continues with metformin, tolerating medication well Adequate blood sugar control without evidence of hypoglycemia Continue with current regimen  Moderately increased albuminuria Found to be elevated on labs with the [place], did check in the office at last appointment and was within normal range Will need to continue to monitor albuminuria in the future, likely recheck at next office visit  Patient requesting refill of temazepam, on review it seems that this is typically prescribed by his mental health provider at the [place].  He does have appointment arranged with this provider later this week, advised for him to discuss refill with that provider.  Patient in agreement.  Plan for follow-up in about 3 to 4 months or sooner as needed.  Plan to check hemoglobin A1c at next follow-up   ___________________________________________ Cianna Kasparian de [place], MD, ABFM, [facility] Primary [facility] and [place]

## 2021-07-05 NOTE — Patient Instructions (Signed)
°  Medication Instructions:  Your physician recommends that you continue on your current medications as directed. Please refer to the Current Medication list given to you today. --If you need a refill on any your medications before your next appointment, please call your pharmacy first. If no refills are authorized on file call the office.--    Follow-Up: Your next appointment:   Your physician recommends that you schedule a follow-up appointment in: 2-3 months with Dr. de [place]  You will receive a text message or e-mail with a link to a survey about your care and experience with [place] today! We would greatly appreciate your feedback!   Thanks for letting us be apart of your health journey!!  Primary Care and Sports Medicine   Dr. Arlina Robes [place]   We encourage you to activate your patient portal called "MyChart".  Sign up information is provided on this After Visit Summary.  MyChart is used to connect with patients for Virtual Visits (Telemedicine).  Patients are able to view lab/test results, encounter notes, upcoming appointments, etc.  Non-urgent messages can be sent to your provider as well. To learn more about what you can do with MyChart, please visit --  [email or web address].

## 2021-07-06 NOTE — Assessment & Plan Note (Signed)
Last hemoglobin A1c was 5.5%, indicating diabetes likely in remission at present.  He continues with metformin, tolerating medication well Adequate blood sugar control without evidence of hypoglycemia Continue with current regimen

## 2021-07-06 NOTE — Assessment & Plan Note (Signed)
Blood pressure at goal in office today Continues with hydrochlorothiazide, lisinopril, tolerating well Denies any issues with chest pain, lightheadedness or dizziness Recommend intermittent monitoring of blood pressure at home, DASH diet

## 2021-07-06 NOTE — Assessment & Plan Note (Signed)
Found to be elevated on labs with the [place], did check in the office at last appointment and was within normal range Will need to continue to monitor albuminuria in the future, likely recheck at next office visit

## 2021-09-14 ENCOUNTER — Other Ambulatory Visit (HOSPITAL_BASED_OUTPATIENT_CLINIC_OR_DEPARTMENT_OTHER): Payer: Self-pay

## 2021-09-14 DIAGNOSIS — E1142 Type 2 diabetes mellitus with diabetic polyneuropathy: Secondary | ICD-10-CM

## 2021-10-03 ENCOUNTER — Ambulatory Visit (INDEPENDENT_AMBULATORY_CARE_PROVIDER_SITE_OTHER): Payer: Medicare Other | Admitting: Podiatrist

## 2021-10-03 DIAGNOSIS — B351 Tinea unguium: Secondary | ICD-10-CM

## 2021-10-03 DIAGNOSIS — E0843 Diabetes mellitus due to underlying condition with diabetic autonomic (poly)neuropathy: Secondary | ICD-10-CM | POA: Diagnosis not present

## 2021-10-03 DIAGNOSIS — M79609 Pain in unspecified limb: Secondary | ICD-10-CM

## 2021-10-03 MED ORDER — GABAPENTIN 300 MG PO CAPS: ORAL_CAPSULE | ORAL | 3 refills | Status: DC

## 2021-10-03 NOTE — Patient Instructions (Addendum)
GENERAL FOOT HEALTH INFORMATION:  Moisturize your feet regularly with a cream based lotion.  One's I recommend are Cetaphil (Cream) or Eucerin (Cream)- usually available in a tub or crock type of container.  Avoid applying the cream to the toe interspaces themselves to reduce the risk of a fungal infection between the toes.  After showering or bathing be sure to dry well between your toes.     Watch your toenails for any signs of infection including drainage, pus redness or swelling along the sides of the toenails.  Soak in epsom salt water and use antibiotic ointment (OTC) if you notice this start to occur.  If the redness does not resolve within 2-3 days, call for an appointment to be seen.   I will also start you on a medication called Gabapentin-  it is a very adjustable medicine.  The instructions for how to take the medication will be printed on the bottle    I called your prescription into [facility]  [address]  [phone]

## 2021-10-03 NOTE — Progress Notes (Signed)
Chief Complaint  Patient presents with   Diabetes    Nail trim      HPI: Patient is 81 y.o. male who presents today for a diabetic foot evaluation and for a nail trim.  He has been diabetic for 12-15 years and relates he is seen at the [place].  [facility] disgnosed him with neuropathy and he relates a burning type pain that is bothersome.   He has tried no treatment for the neuropathy.   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 10/27/2020   Exudative age-related macular degeneration of left eye with active choroidal neovascularization ([place]) 10/27/2020   Diabetes ([place]) 09/01/2020   Retinal tear 09/01/2020   Headache(784.0) 01/16/2012   Atypical nevus 11/07/2011   Trigger finger 05/22/2011   Bronchitis 05/22/2011   Preop cardiovascular exam 04/11/2011   Knee pain 02/24/2011   Bursitis of elbow 10/31/2010   OBESITY 04/28/2010   INCONTINENCE, URGE 04/28/2010   LEG PAIN, BILATERAL 12/08/2009   CHEST PAIN 12/08/2009   ELECTROCARDIOGRAM, ABNORMAL 12/08/2009   NEOPLASM, SKIN, UNCERTAIN BEHAVIOR 84/16/6063   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     aspirin 81 MG  tablet Take 81 mg by mouth daily.     hydrochlorothiazide (HYDRODIURIL) 25 MG tablet Take 25 mg by mouth daily.     ibuprofen (ADVIL) 800 MG tablet Take 1 tablet (800 mg total) by mouth every 8 (eight) hours as needed. 30 tablet 1   lisinopril (PRINIVIL,ZESTRIL) 40 MG tablet Take 1 tablet (40 mg total) by mouth daily. 90 tablet 3   metFORMIN (GLUCOPHAGE) 850 MG tablet Take 1 tablet (850 mg total) by mouth daily. 90 tablet 3   ONE TOUCH ULTRA TEST test strip USE AS DIRECTED 100 each 0   ONETOUCH DELICA LANCETS MISC by Does not apply route 2 (two) times daily.     sildenafil (VIAGRA) 100 MG tablet Take 1 tablet (100 mg total) by mouth as needed for erectile dysfunction. 20 tablet 1   simvastatin (ZOCOR) 20 MG tablet Take 1 tablet (20 mg total) by mouth daily. 90 tablet 3   temazepam (RESTORIL) 30 MG capsule Take 1 capsule (30 mg total) by mouth at bedtime as needed. 90 capsule 0   traMADol (ULTRAM) 50 MG tablet Take 1 tablet (50 mg total) by mouth every 6 (six) hours as needed. 90 tablet 3   No current facility-administered medications on file prior to visit.    Allergies  Allergen Reactions   Doxycycline Nausea And Vomiting    Review of Systems No fevers, chills, nausea, muscle aches, no difficulty breathing, no calf pain, no chest pain or shortness of breath.   Physical Exam  GENERAL APPEARANCE: Alert, conversant. Appropriately groomed. No acute distress.   VASCULAR:  Pedal pulses palpable 2/4 DP and1/4  PT bilateral.  Capillary refill time is immediate to all digits,  Proximal to distal cooling it warm to warm.  Digital perfusion adequate.   NEUROLOGIC: sensation is decreased to 5.07 monofilament at 3/5 sites bilateral.  Light touch is intact bilateral, vibratory sensation decreased bilateral.  Painful burning sensation is reported.   MUSCULOSKELETAL: acceptable muscle strength, tone and stability bilateral.  No gross boney pedal deformities noted.  No pain, crepitus or limitation  noted with foot and ankle range of motion bilateral.   DERMATOLOGIC: skin is warm, supple, and dry.  Skin is xerotic and dry on the feet and bilateral legs.   No open wounds are noted.  No preulcerative lesions are seen.  Digital nails are thick, discolored, dystrophic, brittle with subungual debris present and clinically mycotic x 10.     Assessment     ICD-10-CM   1. Diabetes mellitus due to underlying condition with diabetic autonomic neuropathy, unspecified whether long term insulin use (HCC)  E08.43     2. Pain due to onychomycosis of nail  B35.1    M79.609        Plan  Discussed exam findings with the patient.  We discussed treatment options and alternatives for neuropathy including oral and topical options.  He would like to try gabapentin and therefore I wrote a prescription for this medicine and instructions for titrating up if needed.   I also trimmed his toenails today x 10 with sterile nail nippers and a power burr without complication.    Periodic routine nail debridement recommended every 3 months or as needed for follow-up.  We will check how he is doing with the gabapentin and if dose adjustments are needed at his next appointment.  He will call if any concerns arise in the meantime.

## 2021-10-10 ENCOUNTER — Encounter: Payer: Self-pay | Admitting: Podiatrist

## 2021-10-18 ENCOUNTER — Emergency Department (HOSPITAL_BASED_OUTPATIENT_CLINIC_OR_DEPARTMENT_OTHER)
Admission: EM | Admit: 2021-10-18 | Discharge: 2021-10-18 | Disposition: A | Discharge status: Home / Self Care | Payer: Medicare Other | Attending: Emergency Medicine | Admitting: Emergency Medicine

## 2021-10-18 ENCOUNTER — Emergency Department (HOSPITAL_BASED_OUTPATIENT_CLINIC_OR_DEPARTMENT_OTHER): Payer: Medicare Other

## 2021-10-18 ENCOUNTER — Other Ambulatory Visit (HOSPITAL_BASED_OUTPATIENT_CLINIC_OR_DEPARTMENT_OTHER): Payer: Self-pay

## 2021-10-18 ENCOUNTER — Other Ambulatory Visit: Payer: Self-pay

## 2021-10-18 ENCOUNTER — Encounter (HOSPITAL_BASED_OUTPATIENT_CLINIC_OR_DEPARTMENT_OTHER): Payer: Self-pay

## 2021-10-18 ENCOUNTER — Emergency Department (HOSPITAL_BASED_OUTPATIENT_CLINIC_OR_DEPARTMENT_OTHER): Payer: Medicare Other | Admitting: Radiology

## 2021-10-18 DIAGNOSIS — S46912A Strain of unspecified muscle, fascia and tendon at shoulder and upper arm level, left arm, initial encounter: Secondary | ICD-10-CM | POA: Diagnosis not present

## 2021-10-18 DIAGNOSIS — E119 Type 2 diabetes mellitus without complications: Secondary | ICD-10-CM | POA: Diagnosis not present

## 2021-10-18 DIAGNOSIS — I10 Essential (primary) hypertension: Secondary | ICD-10-CM | POA: Diagnosis not present

## 2021-10-18 DIAGNOSIS — Z7984 Long term (current) use of oral hypoglycemic drugs: Secondary | ICD-10-CM | POA: Insufficient documentation

## 2021-10-18 DIAGNOSIS — X509XXA Other and unspecified overexertion or strenuous movements or postures, initial encounter: Secondary | ICD-10-CM | POA: Insufficient documentation

## 2021-10-18 DIAGNOSIS — S4992XA Unspecified injury of left shoulder and upper arm, initial encounter: Secondary | ICD-10-CM | POA: Diagnosis present

## 2021-10-18 DIAGNOSIS — Z7982 Long term (current) use of aspirin: Secondary | ICD-10-CM | POA: Insufficient documentation

## 2021-10-18 DIAGNOSIS — Z79899 Other long term (current) drug therapy: Secondary | ICD-10-CM | POA: Diagnosis not present

## 2021-10-18 MED ORDER — OXYCODONE-ACETAMINOPHEN 5-325 MG PO TABS
1.0000 | ORAL_TABLET | Freq: Four times a day (QID) | ORAL | 0 refills | Status: DC | PRN
  Filled 2021-10-18: qty 15, 4d supply, fill #0

## 2021-10-18 MED ORDER — HYDROMORPHONE HCL 1 MG/ML IJ SOLN
1.0000 mg | Freq: Once | INTRAMUSCULAR | Status: AC
  Administered 2021-10-18: 1 mg via INTRAMUSCULAR
  Filled 2021-10-18: qty 1

## 2021-10-18 MED ORDER — MORPHINE SULFATE (PF) 4 MG/ML IV SOLN
4.0000 mg | Freq: Once | INTRAVENOUS | Status: AC
  Administered 2021-10-18: 4 mg via INTRAMUSCULAR
  Filled 2021-10-18: qty 1

## 2021-10-18 NOTE — ED Triage Notes (Signed)
Pt. States he had shoulder replacement aprox. 12 years ago on left shoulder, and was trying to push self up in the bed and heard a pop and now has a lump on shoulder. States pain is 10/10. States pain does not radiate.

## 2021-10-18 NOTE — ED Notes (Signed)
Patient verbalizes understanding of discharge instructions. Opportunity for questioning and answers were provided. Patient discharged from ED.  °

## 2021-10-18 NOTE — ED Notes (Signed)
Patient transported to CT 

## 2021-10-18 NOTE — ED Provider Notes (Signed)
[facility] EMERGENCY DEPT Provider Note   CSN: [id number] Arrival date & time: 10/18/21  1007     History {Add pertinent medical, surgical, social history, OB history to HPI:1} Chief Complaint  Patient presents with   Shoulder Pain    MUSTAPHA COLSON is a 81 y.o. male.  Pt is a 81 yo male with a pmhx significant for htn, hyperlipidemia, dm2, and arthritis.  He had a reverse shoulder replacement in 2012 by Dr. Veverly Fells.  Pt said he has been doing well until last night.  He said he was trying to push himself up in the bed and felt a pop in his shoulder.  He said the pain is a 10/10.  It does not go anywhere.  He did not hurt himself anywhere else.        Home Medications Prior to Admission medications   Medication Sig Start Date End Date Taking? Authorizing Provider  allopurinol (ZYLOPRIM) 300 MG tablet Take 300 mg by mouth daily.    [provider]  aspirin 81 MG tablet Take 81 mg by mouth daily.    [provider]  gabapentin (NEURONTIN) 300 MG capsule Take 1 tab before bed x 2 weeks.  May increase to 1 tab at dinner for 2 weeks and then may increase to am, dinner and before bed thereafter for pain. 10/03/21   Bronson Ing, DPM  hydrochlorothiazide (HYDRODIURIL) 25 MG tablet Take 25 mg by mouth daily.    [provider]  ibuprofen (ADVIL) 800 MG tablet Take 1 tablet (800 mg total) by mouth every 8 (eight) hours as needed. 07/05/21   de [place], [name], MD  lisinopril (PRINIVIL,ZESTRIL) 40 MG tablet Take 1 tablet (40 mg total) by mouth daily. 05/29/11   Midge Minium, MD  metFORMIN (GLUCOPHAGE) 850 MG tablet Take 1 tablet (850 mg total) by mouth daily. 05/29/11   Midge Minium, MD  ONE TOUCH ULTRA TEST test strip USE AS DIRECTED 06/01/12   Midge Minium, MD  [facility] DELICA LANCETS MISC by Does not apply route 2 (two) times daily.    [provider]  sildenafil (VIAGRA) 100 MG tablet Take 1 tablet (100 mg total) by  mouth as needed for erectile dysfunction. 01/05/11 12/01/20  Midge Minium, MD  simvastatin (ZOCOR) 20 MG tablet Take 1 tablet (20 mg total) by mouth daily. 02/24/11   Midge Minium, MD  temazepam (RESTORIL) 30 MG capsule Take 1 capsule (30 mg total) by mouth at bedtime as needed. 08/03/11   Ann Held, DO  traMADol (ULTRAM) 50 MG tablet Take 1 tablet (50 mg total) by mouth every 6 (six) hours as needed. 05/29/11   Midge Minium, MD      Allergies    Doxycycline    Review of Systems   Review of Systems  Musculoskeletal:        Left shoulder pain  All other systems reviewed and are negative.  Physical Exam Updated Vital Signs BP (!) 168/80 (BP Location: Right Arm)   Pulse 68   Temp 98 F (36.7 C) (Oral)   Resp 20   Ht '5\' 9"'$  (1.753 m)   Wt 106.6 kg   SpO2 97%   BMI 34.70 kg/m  Physical Exam Vitals and nursing note reviewed.  Constitutional:      Appearance: Normal appearance.  HENT:     Head: Normocephalic and atraumatic.     Right Ear: External ear normal.  Left Ear: External ear normal.     Nose: Nose normal.     Mouth/Throat:     Mouth: Mucous membranes are moist.     Pharynx: Oropharynx is clear.  Eyes:     Extraocular Movements: Extraocular movements intact.     Conjunctiva/sclera: Conjunctivae normal.     Pupils: Pupils are equal, round, and reactive to light.  Cardiovascular:     Rate and Rhythm: Normal rate and regular rhythm.     Pulses: Normal pulses.     Heart sounds: Normal heart sounds.  Pulmonary:     Effort: Pulmonary effort is normal.     Breath sounds: Normal breath sounds.  Abdominal:     General: Abdomen is flat. Bowel sounds are normal.  Musculoskeletal:     Left shoulder: Tenderness present. Decreased range of motion.     Cervical back: Normal range of motion and neck supple.  Skin:    General: Skin is warm.     Capillary Refill: Capillary refill takes less than 2 seconds.  Neurological:     General: No focal  deficit present.     Mental Status: He is alert and oriented to person, place, and time.  Psychiatric:        Mood and Affect: Mood normal.        Behavior: Behavior normal.        Thought Content: Thought content normal.        Judgment: Judgment normal.    ED Results / Procedures / Treatments   Labs (all labs ordered are listed, but only abnormal results are displayed) Labs Reviewed - No data to display  EKG None  Radiology CT Shoulder Left Wo Contrast  Result Date: 10/18/2021 CLINICAL DATA:  Left shoulder pain.  No known recent injury EXAM: CT OF THE UPPER LEFT EXTREMITY WITHOUT CONTRAST TECHNIQUE: Multidetector CT imaging of the upper left extremity was performed according to the standard protocol. RADIATION DOSE REDUCTION: This exam was performed according to the departmental dose-optimization program which includes automated exposure control, adjustment of the mA and/or kV according to patient size and/or use of iterative reconstruction technique. 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 Chronic appearing fracture defect along the posteroinferior glenoid rim with corticated margins (series 4, image 53). Corticated osseous fragment along the inferior glenoid rim extending into the axillary pouch measuring 2.0 x 0.6 x 1.9 cm (series 4, image 58; series 12, image 37). No periprosthetic fluid collection. No evidence of an acute fracture. AC joint intact with moderate osteoarthritis. No evidence of stress changes of the acromion. Remaining included osseous structures appear intact. Ligaments Suboptimally assessed by CT. Muscles and Tendons Mild rotator cuff muscle atrophy. No acute musculotendinous abnormality by CT. Soft tissues No soft tissue swelling or fluid collection about the left shoulder. No left axillary lymphadenopathy. Aortic and coronary artery atherosclerotic calcifications are  seen. Included portion of the left lung field demonstrates no significant findings. IMPRESSION: 1. 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 2. Chronic-appearing fracture defect along the posteroinferior glenoid rim. Corticated osseous fragment along the inferior glenoid rim extending into the axillary pouch measuring up to 2.0 cm. 3. Aortic and coronary artery atherosclerosis (ICD10-I70.0). Electronically Signed   By: Davina Poke D.O.   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 Electronically Signed   By: Margaretha Sheffield M.D.   On: 10/18/2021 11:24   Result Date: 10/18/2021 CLINICAL DATA:  pain EXAM: LEFT SHOULDER - 2+ VIEW COMPARISON:  None Available. FINDINGS: No convincing joint malalignment. Reverse shoulder arthroplasty. 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 Cross-sectional imaging could further evaluate if clinically warranted. Electronically Signed: By: Margaretha Sheffield M.D. On: 10/18/2021 11:20    Procedures Procedures  {Document cardiac monitor, telemetry assessment procedure when appropriate:1}  Medications Ordered in ED Medications  morphine (PF) 4 MG/ML injection 4 mg (4 mg Intramuscular Given 10/18/21 1159)    ED Course/ Medical Decision Making/ A&P                           Medical Decision Making Amount and/or Complexity of Data Reviewed Radiology: ordered.  Risk Prescription drug management.   This  patient presents to the ED for concern of left shoulder pain, this involves an extensive number of treatment options, and is a complaint that carries with it a high risk of complications and morbidity.  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 CT shoulder:     IMPRESSION:  1. Postsurgical changes status post reverse left shoulder  arthroplasty without evidence of hardware loosening or acute  fracture.  2. Chronic-appearing fracture defect along the posteroinferior  glenoid rim. Corticated osseous fragment along the inferior glenoid  rim extending into the axillary pouch measuring up to 2.0 cm.  3. Aortic and coronary artery atherosclerosis (ICD10-I70.0).    I agree with the radiologist interpretation   Medicines ordered and prescription drug management:  I ordered medication including morphine  for pain  Reevaluation of the patient after these medicines showed that the patient improved I have reviewed the patients home medicines and have made adjustments as needed   Test Considered:  ***   Critical Interventions:  ***   Consultations Obtained:  I requested consultation with the ***,  and discussed lab and imaging findings as well as pertinent plan - they recommend: ***   Problem List / ED Course:  ***   Reevaluation:  After the interventions noted above,  I reevaluated the patient and found that they have :{resolved/improved/worsened:23923::"improved"}   Social Determinants of Health:  ***   Dispostion:  After consideration of the diagnostic results and the patients response to treatment, I feel that the patent would benefit from ***.    {Document critical care time when appropriate:1} {Document review of labs and clinical decision tools ie heart score, Chads2Vasc2 etc:1}  {Document your independent review of radiology images, and any outside records:1} {Document your discussion with family members,  caretakers, and with consultants:1} {Document social determinants of health affecting pt's care:1} {Document your decision making why or why not admission, treatments were needed:1} Final Clinical Impression(s) / ED Diagnoses Final diagnoses:  None    Rx / DC Orders ED Discharge Orders     None

## 2021-10-21 ENCOUNTER — Ambulatory Visit (INDEPENDENT_AMBULATORY_CARE_PROVIDER_SITE_OTHER): Payer: Medicare Other | Admitting: Family Medicine

## 2021-10-21 ENCOUNTER — Encounter (HOSPITAL_BASED_OUTPATIENT_CLINIC_OR_DEPARTMENT_OTHER): Payer: Self-pay | Admitting: Family Medicine

## 2021-10-21 VITALS — BP 151/64 | HR 69 | Ht 69.0 in | Wt 242.0 lb

## 2021-10-21 DIAGNOSIS — M25512 Pain in left shoulder: Secondary | ICD-10-CM

## 2021-10-21 MED ORDER — PREDNISONE 20 MG PO TABS: 20.0000 mg | ORAL_TABLET | Freq: Every day | ORAL | 0 refills | Status: DC

## 2021-10-21 MED ORDER — TRAMADOL HCL 50 MG PO TABS: 50.0000 mg | ORAL_TABLET | Freq: Four times a day (QID) | ORAL | 0 refills | Status: DC | PRN

## 2021-10-21 NOTE — Progress Notes (Incomplete)
    Procedures performed today:    None.  Independent interpretation of notes and tests performed by another provider:   None.  Brief History, Exam, Impression, and Recommendations:    BP (!) 151/64   Pulse 69   Ht '5\' 9"'$  (1.753 m)   Wt 242 lb (109.8 kg)   SpO2 95%   BMI 35.74 kg/m   SHOULDER, PAIN Patient reports acute left shoulder pain for which he was seen at the emergency department recently.  Evaluation was generally unremarkable and patient was prescribed pain medication at time of discharge.  He reports that the pain medication has provided some relief, however he has developed notable itching generally diffusely.  He is scheduled to see his orthopedic surgeon regarding his shoulder, but would like something to help with the pain as well as to address his ongoing itching/reaction On exam, patient is in moderate discomfort related to his left shoulder.  He is wearing a sling in the office today.  Some excoriations noted, no obvious rash otherwise.  No tongue or lip swelling, normal conversation without any obvious shortness of breath or dyspnea At this time, will have patient discontinue use of Percocet due to reaction.  Will allow for use of tramadol to try to help control his current pain until he is able to have evaluation with orthopedic surgeon We will also treat with a short course of prednisone to try to help with his systemic symptoms, namely pruritus.  Will treat with low-dose due to underlying diabetes and concern for impacting his blood sugars  We will plan for follow-up as needed regarding this issue   ___________________________________________ Tanna Loeffler de [place], MD, ABFM, [facility] and [place]

## 2021-10-26 NOTE — Assessment & Plan Note (Signed)
Patient reports acute left shoulder pain for which he was seen at the emergency department recently.  Evaluation was generally unremarkable and patient was prescribed pain medication at time of discharge.  He reports that the pain medication has provided some relief, however he has developed notable itching generally diffusely.  He is scheduled to see his orthopedic surgeon regarding his shoulder, but would like something to help with the pain as well as to address his ongoing itching/reaction On exam, patient is in moderate discomfort related to his left shoulder.  He is wearing a sling in the office today.  Some excoriations noted, no obvious rash otherwise.  No tongue or lip swelling, normal conversation without any obvious shortness of breath or dyspnea At this time, will have patient discontinue use of Percocet due to reaction.  Will allow for use of tramadol to try to help control his current pain until he is able to have evaluation with orthopedic surgeon We will also treat with a short course of prednisone to try to help with his systemic symptoms, namely pruritus.  Will treat with low-dose due to underlying diabetes and concern for impacting his blood sugars

## 2021-11-02 ENCOUNTER — Ambulatory Visit (HOSPITAL_BASED_OUTPATIENT_CLINIC_OR_DEPARTMENT_OTHER): Payer: TRICARE For Life (TFL) | Admitting: Family Medicine

## 2021-12-21 ENCOUNTER — Ambulatory Visit (HOSPITAL_BASED_OUTPATIENT_CLINIC_OR_DEPARTMENT_OTHER): Payer: Medicare Other | Admitting: Family Medicine

## 2022-01-06 ENCOUNTER — Ambulatory Visit (HOSPITAL_BASED_OUTPATIENT_CLINIC_OR_DEPARTMENT_OTHER): Payer: TRICARE For Life (TFL)

## 2022-01-06 ENCOUNTER — Telehealth: Payer: Self-pay

## 2022-01-06 NOTE — Telephone Encounter (Signed)
Patient forgot medicare AWV sch for today. He would like appointment to be cancelled and rescheduled for a in person visit, not telephone. Also states when making appointment to call his daughter, she provides the transportation to his appointments.

## 2022-04-12 ENCOUNTER — Ambulatory Visit (HOSPITAL_BASED_OUTPATIENT_CLINIC_OR_DEPARTMENT_OTHER): Payer: TRICARE For Life (TFL) | Admitting: Family Medicine

## 2022-04-19 ENCOUNTER — Emergency Department (HOSPITAL_BASED_OUTPATIENT_CLINIC_OR_DEPARTMENT_OTHER): Payer: Medicare Other | Admitting: Radiology

## 2022-04-19 ENCOUNTER — Other Ambulatory Visit: Payer: Self-pay

## 2022-04-19 ENCOUNTER — Emergency Department (HOSPITAL_BASED_OUTPATIENT_CLINIC_OR_DEPARTMENT_OTHER): Payer: Medicare Other

## 2022-04-19 ENCOUNTER — Emergency Department (HOSPITAL_BASED_OUTPATIENT_CLINIC_OR_DEPARTMENT_OTHER)
Admission: EM | Admit: 2022-04-19 | Discharge: 2022-04-19 | Disposition: A | Discharge status: Home / Self Care | Payer: Medicare Other | Attending: Emergency Medicine | Admitting: Emergency Medicine

## 2022-04-19 DIAGNOSIS — M1711 Unilateral primary osteoarthritis, right knee: Secondary | ICD-10-CM | POA: Diagnosis not present

## 2022-04-19 DIAGNOSIS — Z7982 Long term (current) use of aspirin: Secondary | ICD-10-CM | POA: Diagnosis not present

## 2022-04-19 DIAGNOSIS — Z7984 Long term (current) use of oral hypoglycemic drugs: Secondary | ICD-10-CM | POA: Insufficient documentation

## 2022-04-19 DIAGNOSIS — M25561 Pain in right knee: Secondary | ICD-10-CM | POA: Diagnosis present

## 2022-04-19 MED ORDER — DICLOFENAC SODIUM 1 % EX GEL
2.0000 g | Freq: Four times a day (QID) | CUTANEOUS | 0 refills | Status: DC
Start: 2022-04-19 — End: 2023-04-23

## 2022-04-19 MED ORDER — HYDROCODONE-ACETAMINOPHEN 5-325 MG PO TABS: 1.0000 | ORAL_TABLET | ORAL | 0 refills | Status: DC | PRN

## 2022-04-19 NOTE — Discharge Instructions (Addendum)
Tylenol as needed as directed. Apply Volatren gel to knee as prescribed. IF above medications are not helping, take Norco IN PLACE of your Tylenol (Norco contains Tylenol). Follow up with your orthopedic doctor, call to schedule an appointment.

## 2022-04-19 NOTE — ED Provider Notes (Signed)
[facility] EMERGENCY DEPT Provider Note   CSN: [id number] Arrival date & time: 04/19/22  1156     History  Chief Complaint  Patient presents with   Knee Pain    Kenneth Henson is a 81 y.o. male.  Pt complains of right knee pain (posterior knee) and swelling x 6-8 weeks, no falls or injuries. Had prior knee scope a long time ago, unsure why. No fevers. Started before going on a cruise, did a lot of walking while on the cruise, returned home 04/09/22.        Home Medications Prior to Admission medications   Medication Sig Start Date End Date Taking? Authorizing Provider  diclofenac Sodium (VOLTAREN) 1 % GEL Apply 2 g topically 4 (four) times daily. 04/19/22  Yes Tacy Learn, PA-C  HYDROcodone-acetaminophen (NORCO/VICODIN) 5-325 MG tablet Take 1 tablet by mouth every 4 (four) hours as needed. 04/19/22  Yes Tacy Learn, PA-C  allopurinol (ZYLOPRIM) 300 MG tablet Take 300 mg by mouth daily.    [provider]  aspirin 81 MG tablet Take 81 mg by mouth daily.    [provider]  gabapentin (NEURONTIN) 300 MG capsule Take 1 tab before bed x 2 weeks.  May increase to 1 tab at dinner for 2 weeks and then may increase to am, dinner and before bed thereafter for pain. 10/03/21   Bronson Ing, DPM  hydrochlorothiazide (HYDRODIURIL) 25 MG tablet Take 25 mg by mouth daily.    [provider]  ibuprofen (ADVIL) 800 MG tablet Take 1 tablet (800 mg total) by mouth every 8 (eight) hours as needed. 07/05/21   [place], [name], MD  lisinopril (PRINIVIL,ZESTRIL) 40 MG tablet Take 1 tablet (40 mg total) by mouth daily. 05/29/11   Midge Minium, MD  metFORMIN (GLUCOPHAGE) 850 MG tablet Take 1 tablet (850 mg total) by mouth daily. 05/29/11   Midge Minium, MD  mirtazapine (REMERON) 15 MG tablet Take 15 mg by mouth at bedtime. 04/04/22   [provider]  ONE TOUCH ULTRA TEST test strip USE AS DIRECTED 06/01/12   Midge Minium,  MD  [facility] DELICA LANCETS MISC by Does not apply route 2 (two) times daily.    [provider]  oxyCODONE-acetaminophen (PERCOCET/ROXICET) 5-325 MG tablet Take 1 tablet by mouth every 6 (six) hours as needed for severe pain. Patient not taking: Reported on 10/21/2021 10/18/21   Isla Pence, MD  predniSONE (DELTASONE) 20 MG tablet Take 1 tablet (20 mg total) by mouth daily with breakfast. 10/21/21   [place], [name], MD  sildenafil (VIAGRA) 100 MG tablet Take 1 tablet (100 mg total) by mouth as needed for erectile dysfunction. 01/05/11 12/01/20  Midge Minium, MD  simvastatin (ZOCOR) 20 MG tablet Take 1 tablet (20 mg total) by mouth daily. 02/24/11   Midge Minium, MD  temazepam (RESTORIL) 30 MG capsule Take 1 capsule (30 mg total) by mouth at bedtime as needed. 08/03/11   Ann Held, DO  traMADol (ULTRAM) 50 MG tablet Take 1 tablet (50 mg total) by mouth every 6 (six) hours as needed. 10/21/21   de [place], [name], MD      Allergies    Doxycycline and Oxycodone-acetaminophen    Review of Systems   Review of Systems Negative except as per HPI Physical Exam Updated Vital Signs BP (!) 151/70 (BP Location: Right Arm)   Pulse 67   Temp 98.3 F (36.8 C) (Oral)  Resp 16   Ht '5\' 9"'$  (1.753 m)   Wt 109.8 kg   SpO2 95%   BMI 35.75 kg/m  Physical Exam Vitals and nursing note reviewed.  Constitutional:      General: He is not in acute distress.    Appearance: He is well-developed. He is not diaphoretic.  HENT:     Head: Normocephalic and atraumatic.  Cardiovascular:     Pulses: Normal pulses.  Pulmonary:     Effort: Pulmonary effort is normal.  Musculoskeletal:        General: Swelling and tenderness present. No deformity.     Right lower leg: Edema present.     Left lower leg: Edema present.     Comments: Trace bilateral lower extremity pitting edema  Skin:    General: Skin is warm and dry.     Findings: No erythema or rash.  Neurological:     Mental  Status: He is alert and oriented to person, place, and time.  Psychiatric:        Behavior: Behavior normal.     ED Results / Procedures / Treatments   Labs (all labs ordered are listed, but only abnormal results are displayed) Labs Reviewed - No data to display  EKG None  Radiology US Venous Img Lower Right (DVT Study)  Result Date: 04/19/2022 CLINICAL DATA:  Right lower extremity edema. EXAM: RIGHT LOWER EXTREMITY VENOUS DOPPLER ULTRASOUND TECHNIQUE: Gray-scale sonography with graded compression, as well as color Doppler and duplex ultrasound were performed to evaluate the lower extremity deep venous systems from the level of the common femoral vein and including the common femoral, femoral, profunda femoral, popliteal and calf veins including the posterior tibial, peroneal and gastrocnemius veins when visible. The superficial great saphenous vein was also interrogated. Spectral Doppler was utilized to evaluate flow at rest and with distal augmentation maneuvers in the common femoral, femoral and popliteal veins. COMPARISON:  None Available. FINDINGS: Contralateral Common Femoral Vein: Respiratory phasicity is normal and symmetric with the symptomatic side. No evidence of thrombus. Normal compressibility. Common Femoral Vein: No evidence of thrombus. Normal compressibility, respiratory phasicity and response to augmentation. Saphenofemoral Junction: No evidence of thrombus. Normal compressibility and flow on color Doppler imaging. Profunda Femoral Vein: No evidence of thrombus. Normal compressibility and flow on color Doppler imaging. Femoral Vein: No evidence of thrombus. Normal compressibility, respiratory phasicity and response to augmentation. Popliteal Vein: No evidence of thrombus. Normal compressibility, respiratory phasicity and response to augmentation. Calf Veins: No evidence of thrombus. Normal compressibility and flow on color Doppler imaging. Superficial Great Saphenous Vein: No  evidence of thrombus. Normal compressibility. Venous Reflux:  None. Other Findings: No evidence of superficial thrombophlebitis or abnormal fluid collection. IMPRESSION: No evidence of right lower extremity deep venous thrombosis. Electronically Signed   By: Aletta Edouard M.D.   On: 04/19/2022 14:14   DG Knee Complete 4 Views Right  Result Date: 04/19/2022 CLINICAL DATA:  Right leg swelling and pain. EXAM: RIGHT KNEE - COMPLETE 4+ VIEW COMPARISON:  None Available. FINDINGS: No joint effusion or acute osseous abnormality. Tricompartment osteophytosis. 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 Subchondral sclerosis in the medial compartment. IMPRESSION: 1. No acute findings. 2. Tricompartment osteoarthritis, worst in the medial compartment. Electronically Signed   By: Lorin Picket M.D.   On: 04/19/2022 13:46    Procedures Procedures    Medications Ordered in ED Medications - No data to display  ED Course/ Medical Decision Making/ A&P  Medical Decision Making Amount and/or Complexity of Data Reviewed Radiology: ordered.  Risk Prescription drug management.   81 year old male presents with complaint of right knee pain and swelling without injury as above.  He is found to have generalized right knee tenderness with pain in his right calf.  No appreciable effusion of the right knee.  DP pulse present.  X-ray shows tricompartmental osteoarthritis, agree with radiologist interpretation.  Vascular study of the right lower extremity is negative for DVT.  Discussed results with patient.  He is currently taking ibuprofen for pain.  I am unable to see a recent renal function on this patient, he is unsure if his doctor has told him anything about taking NSAIDs.  Advised to discontinue oral NSAIDs.  Will work on topical diclofenac with Tylenol and if not improving can take short course of Norco.  Patient plans to follow-up with his orthopedic specialist,  recommend calling to schedule this appointment.        Final Clinical Impression(s) / ED Diagnoses Final diagnoses:  Osteoarthritis of right knee, unspecified osteoarthritis type    Rx / DC Orders ED Discharge Orders          Ordered    diclofenac Sodium (VOLTAREN) 1 % GEL  4 times daily        04/19/22 1610    HYDROcodone-acetaminophen (NORCO/VICODIN) 5-325 MG tablet  Every 4 hours PRN        04/19/22 1610              Roque Lias 04/19/22 1705    Regan Lemming, MD 04/19/22 1742

## 2022-04-19 NOTE — ED Triage Notes (Signed)
Patient here POV from Home.  Endorses Right Knee Swelling and Pain for approximately 6 Weeks. Recent Cruise Trip but Swelling was Present somewhat prior to Trip. Pulse Palpable Distally.  NAD Noted during Triage. A&Ox4. GCS 15. BIB Wheelchair.

## 2022-04-19 NOTE — ED Notes (Signed)
Discharge paperwork given and verbally understood. 

## 2022-04-19 NOTE — ED Provider Triage Note (Signed)
Emergency Medicine Provider Triage Evaluation Note  Kenneth Henson , a 81 y.o. male  was evaluated in triage.  Pt complains of right knee pain (posterior knee) and swelling x 6-8 weeks, no falls or injuries. Had prior knee scope a long time ago, unsure why. No fevers. Started before going on a cruise, did a lot of walking while on the cruise, returned home 04/09/22.   Review of Systems  Positive: As above Negative: As above  Physical Exam  There were no vitals taken for this visit. Gen:   Awake, no distress   Resp:  Normal effort  MSK:   Moves extremities without difficulty  Other:  Right calf tenderness, DP pulse present   Medical Decision Making  Medically screening exam initiated at 1:08 PM.  Appropriate orders placed.  Kenneth Henson was informed that the remainder of the evaluation will be completed by another provider, this initial triage assessment does not replace that evaluation, and the importance of remaining in the ED until their evaluation is complete.     Tacy Learn, PA-C 04/19/22 1311

## 2022-06-20 ENCOUNTER — Encounter (HOSPITAL_BASED_OUTPATIENT_CLINIC_OR_DEPARTMENT_OTHER): Payer: Self-pay

## 2022-06-20 ENCOUNTER — Ambulatory Visit (HOSPITAL_BASED_OUTPATIENT_CLINIC_OR_DEPARTMENT_OTHER): Payer: Medicare Other

## 2022-06-20 VITALS — Ht 69.0 in | Wt 242.0 lb

## 2022-06-20 DIAGNOSIS — Z0000 Encounter for general adult medical examination without abnormal findings: Secondary | ICD-10-CM | POA: Diagnosis not present

## 2022-06-20 NOTE — Patient Instructions (Addendum)
Kenneth Henson , Thank you for taking time to come for your Medicare Wellness Visit. I appreciate your ongoing commitment to your health goals. Please review the following plan we discussed and let me know if I can assist you in the future.   These are the goals we discussed:  Goals       Stay Healthy (pt-stated)        This is a list of the screening recommended for you and due dates:  Health Maintenance  Topic Date Due   Yearly kidney function blood test for diabetes  02/24/2012   Eye exam for diabetics  07/07/2021   Hemoglobin A1C  08/30/2021   Complete foot exam   09/01/2021   Yearly kidney health urinalysis for diabetes  03/01/2022   COVID-19 Vaccine (6 - 2023-24 season) 07/06/2022*   Flu Shot  08/13/2022*   Medicare Annual Wellness Visit  06/21/2023   DTaP/Tdap/Td vaccine (5 - Td or Tdap) 03/14/2028   Pneumonia Vaccine  Completed   Zoster (Shingles) Vaccine  Completed   HPV Vaccine  Aged Out   Colon Cancer Screening  Discontinued  *Topic was postponed. The date shown is not the original due date.   Opioid Pain Medicine Management Opioids are powerful medicines that are used to treat moderate to severe pain. When used for short periods of time, they can help you to: Sleep better. Do better in physical or occupational therapy. Feel better in the first few days after an injury. Recover from surgery. Opioids should be taken with the supervision of a trained health care provider. They should be taken for the shortest period of time possible. This is because opioids can be addictive, and the longer you take opioids, the greater your risk of addiction. This addiction can also be called opioid use disorder. What are the risks? Using opioid pain medicines for longer than 3 days increases your risk of side effects. Side effects include: Constipation. Nausea and vomiting. Breathing difficulties (respiratory depression). Drowsiness. Confusion. Opioid use disorder. Itching. Taking  opioid pain medicine for a long period of time can affect your ability to do daily tasks. It also puts you at risk for: Motor vehicle crashes. Depression. Suicide. Heart attack. Overdose, which can be life-threatening. What is a pain treatment plan? A pain treatment plan is an agreement between you and your health care provider. Pain is unique to each person, and treatments vary depending on your condition. To manage your pain, you and your health care provider need to work together. To help you do this: Discuss the goals of your treatment, including how much pain you might expect to have and how you will manage the pain. Review the risks and benefits of taking opioid medicines. Remember that a good treatment plan uses more than one approach and minimizes the chance of side effects. Be honest about the amount of medicines you take and about any drug or alcohol use. Get pain medicine prescriptions from only one health care provider. Pain can be managed with many types of alternative treatments. Ask your health care provider to refer you to one or more specialists who can help you manage pain through: Physical or occupational therapy. Counseling (cognitive behavioral therapy). Good nutrition. Biofeedback. Massage. Meditation. Non-opioid medicine. Following a gentle exercise program. How to use opioid pain medicine Taking medicine Take your pain medicine exactly as told by your health care provider. Take it only when you need it. If your pain gets less severe, you may take less than your  prescribed dose if your health care provider approves. If you are not having pain, do nottake pain medicine unless your health care provider tells you to take it. If your pain is severe, do nottry to treat it yourself by taking more pills than instructed on your prescription. Contact your health care provider for help. Write down the times when you take your pain medicine. It is easy to become confused while  on pain medicine. Writing the time can help you avoid overdose. Take other over-the-counter or prescription medicines only as told by your health care provider. Keeping yourself and others safe  While you are taking opioid pain medicine: Do not drive, use machinery, or power tools. Do not sign legal documents. Do not drink alcohol. Do not take sleeping pills. Do not supervise children by yourself. Do not do activities that require climbing or being in high places. Do not go to a lake, river, ocean, spa, or swimming pool. Do not share your pain medicine with anyone. Keep pain medicine in a locked cabinet or in a secure area where pets and children cannot reach it. Stopping your use of opioids If you have been taking opioid medicine for more than a few weeks, you may need to slowly decrease (taper) how much you take until you stop completely. Tapering your use of opioids can decrease your risk of symptoms of withdrawal, such as: Pain and cramping in the abdomen. Nausea. Sweating. Sleepiness. Restlessness. Uncontrollable shaking (tremors). Cravings for the medicine. Do not attempt to taper your use of opioids on your own. Talk with your health care provider about how to do this. Your health care provider may prescribe a step-down schedule based on how much medicine you are taking and how long you have been taking it. Getting rid of leftover pills Do not save any leftover pills. Get rid of leftover pills safely by: Taking the medicine to a prescription take-back program. This is usually offered by the county or law enforcement. Bringing them to a pharmacy that has a drug disposal container. Flushing them down the toilet. Check the label or package insert of your medicine to see whether this is safe to do. Throwing them out in the trash. Check the label or package insert of your medicine to see whether this is safe to do. If it is safe to throw it out, remove the medicine from the original  container, put it into a sealable bag or container, and mix it with used coffee grounds, food scraps, dirt, or cat litter before putting it in the trash. Follow these instructions at home: Activity Do exercises as told by your health care provider. Avoid activities that make your pain worse. Return to your normal activities as told by your health care provider. Ask your health care provider what activities are safe for you. General instructions You may need to take these actions to prevent or treat constipation: Drink enough fluid to keep your urine pale yellow. Take over-the-counter or prescription medicines. Eat foods that are high in fiber, such as beans, whole grains, and fresh fruits and vegetables. Limit foods that are high in fat and processed sugars, such as fried or sweet foods. Keep all follow-up visits. This is important. Where to find support If you have been taking opioids for a long time, you may benefit from receiving support for quitting from a local support group or counselor. Ask your health care provider for a referral to these resources in your area. Where to find more information Centers  for Disease Control and Prevention (CDC): http://www.wolf.info/ [place] Food and Drug Administration (FDA): [place] Get help right away if: You may have taken too much of an opioid (overdosed). Common symptoms of an overdose: Your breathing is slower or more shallow than normal. You have a very slow heartbeat (pulse). You have slurred speech. You have nausea and vomiting. Your pupils become very small. You have other potential symptoms: You are very confused. You faint or feel like you will faint. You have cold, clammy skin. You have blue lips or fingernails. You have thoughts of harming yourself or harming others. These symptoms may represent a serious problem that is an emergency. Do not wait to see if the symptoms will go away. Get medical help right away. Call your local emergency  services (911 in the [place] Do not drive yourself to the hospital.  If you ever feel like you may hurt yourself or others, or have thoughts about taking your own life, get help right away. Go to your nearest emergency department or: Call your local emergency services (911 in the [place] Call the [facility] 905-834-4400 in the [place] Call a suicide crisis helpline, such as the [facility] at 9194661990 or 988 in the [place]. This is open 24 hours a day in the [place] Text the Crisis Text Line at (651) 792-4221 (in the [place].). Summary Opioid medicines can help you manage moderate to severe pain for a short period of time. A pain treatment plan is an agreement between you and your health care provider. Discuss the goals of your treatment, including how much pain you might expect to have and how you will manage the pain. If you think that you or someone else may have taken too much of an opioid, get medical help right away. This information is not intended to replace advice given to you by your health care provider. Make sure you discuss any questions you have with your health care provider. Document Revised: 11/24/2020 Document Reviewed: 08/11/2020 Elsevier Patient Education  [place] directives: Advance directive discussed with you today. Even though you declined this today, please call our office should you change your mind, and we can give you the proper paperwork for you to fill out.   Conditions/risks identified: None  Next appointment: Follow up in one year for your annual wellness visit.    Preventive Care 51 Years and Older, Male  Preventive care refers to lifestyle choices and visits with your health care provider that can promote health and wellness. What does preventive care include? A yearly physical exam. This is also called an annual well check. Dental exams once or twice a year. Routine eye exams. Ask your health care  provider how often you should have your eyes checked. Personal lifestyle choices, including: Daily care of your teeth and gums. Regular physical activity. Eating a healthy diet. Avoiding tobacco and drug use. Limiting alcohol use. Practicing safe sex. Taking low doses of aspirin every day. Taking vitamin and mineral supplements as recommended by your health care provider. What happens during an annual well check? The services and screenings done by your health care provider during your annual well check will depend on your age, overall health, lifestyle risk factors, and family history of disease. Counseling  Your health care provider may ask you questions about your: Alcohol use. Tobacco use. Drug use. Emotional well-being. Home and relationship well-being. Sexual activity. Eating habits. History of falls. Memory and ability to understand (cognition). Work and  work environment. Screening  You may have the following tests or measurements: Height, weight, and BMI. Blood pressure. Lipid and cholesterol levels. These may be checked every 5 years, or more frequently if you are over 77 years old. Skin check. Lung cancer screening. You may have this screening every year starting at age 65 if you have a 30-pack-year history of smoking and currently smoke or have quit within the past 15 years. Fecal occult blood test (FOBT) of the stool. You may have this test every year starting at age 2. Flexible sigmoidoscopy or colonoscopy. You may have a sigmoidoscopy every 5 years or a colonoscopy every 10 years starting at age 85. Prostate cancer screening. Recommendations will vary depending on your family history and other risks. Hepatitis C blood test. Hepatitis B blood test. Sexually transmitted disease (STD) testing. Diabetes screening. This is done by checking your blood sugar (glucose) after you have not eaten for a while (fasting). You may have this done every 1-3 years. Abdominal aortic  aneurysm (AAA) screening. You may need this if you are a current or former smoker. Osteoporosis. You may be screened starting at age 35 if you are at high risk. Talk with your health care provider about your test results, treatment options, and if necessary, the need for more tests. Vaccines  Your health care provider may recommend certain vaccines, such as: Influenza vaccine. This is recommended every year. Tetanus, diphtheria, and acellular pertussis (Tdap, Td) vaccine. You may need a Td booster every 10 years. Zoster vaccine. You may need this after age 55. Pneumococcal 13-valent conjugate (PCV13) vaccine. One dose is recommended after age 74. Pneumococcal polysaccharide (PPSV23) vaccine. One dose is recommended after age 48. Talk to your health care provider about which screenings and vaccines you need and how often you need them. This information is not intended to replace advice given to you by your health care provider. Make sure you discuss any questions you have with your health care provider. Document Released: 05/28/2015 Document Revised: 01/19/2016 Document Reviewed: 03/02/2015 Elsevier Interactive Patient Education  2017 [place] Prevention in the Home Falls can cause injuries. They can happen to people of all ages. There are many things you can do to make your home safe and to help prevent falls. What can I do on the outside of my home? Regularly fix the edges of walkways and driveways and fix any cracks. Remove anything that might make you trip as you walk through a door, such as a raised step or threshold. Trim any bushes or trees on the path to your home. Use bright outdoor lighting. Clear any walking paths of anything that might make someone trip, such as rocks or tools. Regularly check to see if handrails are loose or broken. Make sure that both sides of any steps have handrails. Any raised decks and porches should have guardrails on the edges. Have any leaves,  snow, or ice cleared regularly. Use sand or salt on walking paths during winter. Clean up any spills in your garage right away. This includes oil or grease spills. What can I do in the bathroom? Use night lights. Install grab bars by the toilet and in the tub and shower. Do not use towel bars as grab bars. Use non-skid mats or decals in the tub or shower. If you need to sit down in the shower, use a plastic, non-slip stool. Keep the floor dry. Clean up any water that spills on the floor as soon as it happens.  Remove soap buildup in the tub or shower regularly. Attach bath mats securely with double-sided non-slip rug tape. Do not have throw rugs and other things on the floor that can make you trip. What can I do in the bedroom? Use night lights. Make sure that you have a light by your bed that is easy to reach. Do not use any sheets or blankets that are too big for your bed. They should not hang down onto the floor. Have a firm chair that has side arms. You can use this for support while you get dressed. Do not have throw rugs and other things on the floor that can make you trip. What can I do in the kitchen? Clean up any spills right away. Avoid walking on wet floors. Keep items that you use a lot in easy-to-reach places. If you need to reach something above you, use a strong step stool that has a grab bar. Keep electrical cords out of the way. Do not use floor polish or wax that makes floors slippery. If you must use wax, use non-skid floor wax. Do not have throw rugs and other things on the floor that can make you trip. What can I do with my stairs? Do not leave any items on the stairs. Make sure that there are handrails on both sides of the stairs and use them. Fix handrails that are broken or loose. Make sure that handrails are as long as the stairways. Check any carpeting to make sure that it is firmly attached to the stairs. Fix any carpet that is loose or worn. Avoid having throw  rugs at the top or bottom of the stairs. If you do have throw rugs, attach them to the floor with carpet tape. Make sure that you have a light switch at the top of the stairs and the bottom of the stairs. If you do not have them, ask someone to add them for you. What else can I do to help prevent falls? Wear shoes that: Do not have high heels. Have rubber bottoms. Are comfortable and fit you well. Are closed at the toe. Do not wear sandals. If you use a stepladder: Make sure that it is fully opened. Do not climb a closed stepladder. Make sure that both sides of the stepladder are locked into place. Ask someone to hold it for you, if possible. Clearly mark and make sure that you can see: Any grab bars or handrails. First and last steps. Where the edge of each step is. Use tools that help you move around (mobility aids) if they are needed. These include: Canes. Walkers. Scooters. Crutches. Turn on the lights when you go into a dark area. Replace any light bulbs as soon as they burn out. Set up your furniture so you have a clear path. Avoid moving your furniture around. If any of your floors are uneven, fix them. If there are any pets around you, be aware of where they are. Review your medicines with your doctor. Some medicines can make you feel dizzy. This can increase your chance of falling. Ask your doctor what other things that you can do to help prevent falls. This information is not intended to replace advice given to you by your health care provider. Make sure you discuss any questions you have with your health care provider. Document Released: 02/25/2009 Document Revised: 10/07/2015 Document Reviewed: 06/05/2014 Elsevier Interactive Patient Education  2017 [employer].

## 2022-06-20 NOTE — Progress Notes (Addendum)
Subjective:   Kenneth Henson is a 82 y.o. male who presents for Medicare Annual/Subsequent preventive examination.  Review of Systems    Virtual Visit via Telephone Note  I connected with  Nelva Bush on 06/20/22 at 11:30 AM EST by telephone and verified that I am speaking with the correct person using two identifiers.  Location: Patient: Home Provider: Office Persons participating in the virtual visit: patient/Nurse Health Advisor   I discussed the limitations, risks, security and privacy concerns of performing an evaluation and management service by telephone and the availability of in person appointments. The patient expressed understanding and agreed to proceed.  Interactive audio and video telecommunications were attempted between this nurse and patient, however failed, due to patient having technical difficulties OR patient did not have access to video capability.  We continued and completed visit with audio only.  Some vital signs may be absent or patient reported.   Criselda Peaches, LPN  Cardiac Risk Factors include: advanced age (>19mn, >>62women);diabetes mellitus;hypertension;obesity (BMI >30kg/m2)     Objective:    Today's Vitals   06/20/22 1134  Weight: 242 lb (109.8 kg)  Height: 5' 9"$  (1.753 m)   Body mass index is 35.74 kg/m.     06/20/2022   11:45 AM 04/19/2022    1:10 PM 10/18/2021   10:21 AM  Advanced Directives  Does Patient Have a Medical Advance Directive? No No No  Would patient like information on creating a medical advance directive? No - Patient declined No - Patient declined No - Patient declined    Current Medications (verified) Outpatient Encounter Medications as of 06/20/2022  Medication Sig   allopurinol (ZYLOPRIM) 300 MG tablet Take 300 mg by mouth daily.   aspirin 81 MG tablet Take 81 mg by mouth daily.   diclofenac Sodium (VOLTAREN) 1 % GEL Apply 2 g topically 4 (four) times daily.   gabapentin (NEURONTIN) 300 MG capsule Take 1  tab before bed x 2 weeks.  May increase to 1 tab at dinner for 2 weeks and then may increase to am, dinner and before bed thereafter for pain.   hydrochlorothiazide (HYDRODIURIL) 25 MG tablet Take 25 mg by mouth daily.   HYDROcodone-acetaminophen (NORCO/VICODIN) 5-325 MG tablet Take 1 tablet by mouth every 4 (four) hours as needed.   ibuprofen (ADVIL) 800 MG tablet Take 1 tablet (800 mg total) by mouth every 8 (eight) hours as needed.   lisinopril (PRINIVIL,ZESTRIL) 40 MG tablet Take 1 tablet (40 mg total) by mouth daily.   metFORMIN (GLUCOPHAGE) 850 MG tablet Take 1 tablet (850 mg total) by mouth daily.   mirtazapine (REMERON) 15 MG tablet Take 15 mg by mouth at bedtime.   ONE TOUCH ULTRA TEST test strip USE AS DIRECTED   ONETOUCH DELICA LANCETS MISC by Does not apply route 2 (two) times daily.   oxyCODONE-acetaminophen (PERCOCET/ROXICET) 5-325 MG tablet Take 1 tablet by mouth every 6 (six) hours as needed for severe pain. (Patient not taking: Reported on 10/21/2021)   predniSONE (DELTASONE) 20 MG tablet Take 1 tablet (20 mg total) by mouth daily with breakfast.   sildenafil (VIAGRA) 100 MG tablet Take 1 tablet (100 mg total) by mouth as needed for erectile dysfunction.   simvastatin (ZOCOR) 20 MG tablet Take 1 tablet (20 mg total) by mouth daily.   temazepam (RESTORIL) 30 MG capsule Take 1 capsule (30 mg total) by mouth at bedtime as needed.   traMADol (ULTRAM) 50 MG tablet Take 1 tablet (50  mg total) by mouth every 6 (six) hours as needed.   No facility-administered encounter medications on file as of 06/20/2022.    Allergies (verified) Doxycycline and Oxycodone-acetaminophen   History: Past Medical History:  Diagnosis Date   Abnormal involuntary movements(781.0)    Arthritis    Chicken pox    Claudication (HCC)    Colon polyps    Diabetes mellitus type II    Erectile dysfunction    Hyperlipidemia    Hypertension    Neoplasm of skin    uncertain behavior   Past Surgical History:   Procedure Laterality Date   CARPAL TUNNEL RELEASE     right   HAND SURGERY     KNEE SURGERY     NECK SURGERY     Family History  Problem Relation Age of Onset   Hypertension Mother    Heart disease Mother    Stroke Father    Colon cancer Neg Hx    Stomach cancer Neg Hx    Breast cancer Sister    Colon polyps Sister    Social History   Socioeconomic History   Marital status: Single    Spouse name: Not on file   Number of children: Not on file   Years of education: Not on file   Highest education level: Not on file  Occupational History   Not on file  Tobacco Use   Smoking status: Former    Types: Cigarettes    Quit date: 05/15/2009    Years since quitting: 13.1   Smokeless tobacco: Never  Substance and Sexual Activity   Alcohol use: Yes    Alcohol/week: 2.0 standard drinks of alcohol    Types: 1 Glasses of wine, 1 Cans of beer per week    Comment: social   Drug use: No   Sexual activity: Not on file  Other Topics Concern   Not on file  Social History Narrative   Not on file   Social Determinants of Health   Financial Resource Strain: Low Risk  (06/20/2022)   Overall Financial Resource Strain (CARDIA)    Difficulty of Paying Living Expenses: Not hard at all  Food Insecurity: No Food Insecurity (06/20/2022)   Hunger Vital Sign    Worried About Running Out of Food in the Last Year: Never true    Ran Out of Food in the Last Year: Never true  Transportation Needs: No Transportation Needs (06/20/2022)   PRAPARE - Hydrologist (Medical): No    Lack of Transportation (Non-Medical): No  Physical Activity: Inactive (06/20/2022)   Exercise Vital Sign    Days of Exercise per Week: 0 days    Minutes of Exercise per Session: 0 min  Stress: No Stress Concern Present (06/20/2022)   [place]    Feeling of Stress : Not at all  Social Connections: Moderately Integrated (06/20/2022)    Social Connection and Isolation Panel [NHANES]    Frequency of Communication with Friends and Family: More than three times a week    Frequency of Social Gatherings with Friends and Family: More than three times a week    Attends Religious Services: More than 4 times per year    Active Member of [employer] or Organizations: Yes    Attends Music therapist: More than 4 times per year    Marital Status: Divorced    Tobacco Counseling Counseling given: Not Answered   Clinical Intake:  Pre-visit  preparation completed: No  Pain : No/denies pain   Nutrition Risk Assessment:  Has the patient had any N/V/D within the last 2 months?  No  Does the patient have any non-healing wounds?  No  Has the patient had any unintentional weight loss or weight gain?  No   Diabetes:  Is the patient diabetic?  Yes  If diabetic, was a CBG obtained today?  No  Did the patient bring in their glucometer from home?  No  How often do you monitor your CBG's? PRN.   Financial Strains and Diabetes Management:  Are you having any financial strains with the device, your supplies or your medication? No .  Does the patient want to be seen by Chronic Care Management for management of their diabetes?  No  Would the patient like to be referred to a Nutritionist or for Diabetic Management?  No   Diabetic Exams:  Diabetic Eye Exam: Completed No. Overdue for diabetic eye exam. Pt has been advised about the importance in completing this exam. A referral has been placed today. Message sent to referral coordinator for scheduling purposes. Advised pt to expect a call from office referred to regarding appt.  Diabetic Foot Exam: Completed No. Pt has been advised about the importance in completing this exam. Pt is scheduled for diabetic foot exam on Followed by PCP.    BMI - recorded: 35.74 Nutritional Status: BMI > 30  Obese Nutritional Risks: None Diabetes: Yes CBG done?: No Did pt. bring in CBG monitor from  home?: No  How often do you need to have someone help you when you read instructions, pamphlets, or other written materials from your doctor or pharmacy?: 5 - Always (Daughter Assist)  Diabetic?  Yes  Interpreter Needed?: No  Information entered by :: Rolene Arbour LPN   Activities of Daily Living    06/20/2022   11:43 AM 10/21/2021   10:12 AM  In your present state of health, do you have any difficulty performing the following activities:  Hearing? 0 0  Vision? 1 1  Comment Dx Legally Blind   Difficulty concentrating or making decisions? 0 0  Walking or climbing stairs? 0 1  Dressing or bathing? 0 0  Doing errands, shopping? 0 1  Preparing Food and eating ? N   Using the Toilet? N   In the past six months, have you accidently leaked urine? N   Do you have problems with loss of bowel control? N   Managing your Medications? N   Managing your Finances? N   Housekeeping or managing your Housekeeping? N     Patient Care Team: [place], [name], MD as PCP - General (Family Medicine)  Indicate any recent Medical Services you may have received from other than [facility] providers in the past year (date may be approximate).     Assessment:   This is a routine wellness examination for [place].  Hearing/Vision screen Hearing Screening - Comments:: Denies hearing difficulties   Vision Screening - Comments:: Wears rx glasses - up to date with routine eye exams with  [facility]  Dietary issues and exercise activities discussed: Current Exercise Habits: The patient does not participate in regular exercise at present, Exercise limited by: None identified   Goals Addressed               This Visit's Progress     Stay Healthy (pt-stated)         Depression Screen    06/20/2022  11:42 AM 10/21/2021   10:12 AM 09/01/2020   11:42 AM  PHQ 2/9 Scores  PHQ - 2 Score 0 0 3  PHQ- 9 Score  0 7  Exception Documentation  Medical reason     Fall Risk    06/20/2022   11:45 AM  10/21/2021   10:12 AM 09/01/2020   11:42 AM  Fall Risk   Falls in the past year? 0 0 1  Number falls in past yr: 0 0 1  Injury with Fall? 0 0 1  Risk for fall due to : No Fall Risks No Fall Risks History of fall(s);Impaired mobility;Impaired balance/gait  Follow up Falls prevention discussed Falls evaluation completed Education provided;Falls evaluation completed;Falls prevention discussed    FALL RISK PREVENTION PERTAINING TO THE HOME:  Any stairs in or around the home? No  If so, are there any without handrails? No  Home free of loose throw rugs in walkways, pet beds, electrical cords, etc? Yes  Adequate lighting in your home to reduce risk of falls? Yes   ASSISTIVE DEVICES UTILIZED TO PREVENT FALLS:  Life alert? Yes  Use of a cane, walker or w/c? Yes  Grab bars in the bathroom? Yes  Shower chair or bench in shower? Yes  Elevated toilet seat or a handicapped toilet? No   TIMED UP AND GO:  Was the test performed? No . Audio Visit  Cognitive Function:        06/20/2022   11:46 AM  6CIT Screen  What Year? 0 points  What month? 0 points  What time? 0 points  Count back from 20 0 points  Months in reverse 0 points  Repeat phrase 0 points  Total Score 0 points    Immunizations Immunization History  Administered Date(s) Administered   Fluad Quad(high Dose 65+) 03/19/2020, 03/01/2021   Influenza Inj Mdck Quad Pf 03/23/2017   Influenza Split 02/24/2011   Influenza-Unspecified 02/13/2011, 06/23/2013, 04/08/2014, 02/13/2016, 03/14/2018   Moderna Sars-Covid-2 Vaccination 06/06/2019, 07/03/2019   Pneumococcal Conjugate-13 04/08/2014   Pneumococcal Polysaccharide-23 08/27/2009, 06/20/2018   Pneumococcal-Unspecified 08/27/2009   Td 08/24/2009   Tdap 08/24/2009, 03/14/2018   Tetanus 09/22/2010   Zoster, Live 08/24/2009    TDAP status: Up to date  Flu Vaccine status: Up to date  Pneumococcal vaccine status: Up to date  Covid-19 vaccine status: Completed  vaccines  Qualifies for Shingles Vaccine? Yes   Zostavax completed Yes   Shingrix Completed?: Yes  Screening Tests Health Maintenance  Topic Date Due   Diabetic kidney evaluation - eGFR measurement  02/24/2012   OPHTHALMOLOGY EXAM  07/07/2021   HEMOGLOBIN A1C  08/30/2021   FOOT EXAM  09/01/2021   Diabetic kidney evaluation - Urine ACR  03/01/2022   COVID-19 Vaccine (6 - 2023-24 season) 07/06/2022 (Originally 01/13/2022)   INFLUENZA VACCINE  08/13/2022 (Originally 12/13/2021)   Medicare Annual Wellness (AWV)  06/21/2023   DTaP/Tdap/Td (5 - Td or Tdap) 03/14/2028   Pneumonia Vaccine 49+ Years old  Completed   Zoster Vaccines- Shingrix  Completed   HPV VACCINES  Aged Out   COLONOSCOPY (Pts 45-41yr Insurance coverage will need to be confirmed)  Discontinued    Health Maintenance  Health Maintenance Due  Topic Date Due   Diabetic kidney evaluation - eGFR measurement  02/24/2012   OPHTHALMOLOGY EXAM  07/07/2021   HEMOGLOBIN A1C  08/30/2021   FOOT EXAM  09/01/2021   Diabetic kidney evaluation - Urine ACR  03/01/2022    Colorectal cancer screening: No longer required.  Lung Cancer Screening: (Low Dose CT Chest recommended if Age 31-80 years, 30 pack-year currently smoking OR have quit w/in 15years.) does not qualify.    Additional Screening:  Hepatitis C Screening: does not qualify; Completed   Vision Screening: Recommended annual ophthalmology exams for early detection of glaucoma and other disorders of the eye. Is the patient up to date with their annual eye exam?  Yes  Who is the provider or what is the name of the office in which the patient attends annual eye exams? [facility] If pt is not established with a provider, would they like to be referred to a provider to establish care? No .   Dental Screening: Recommended annual dental exams for proper oral hygiene  Community Resource Referral / Chronic Care Management:  CRR required this visit?  No   CCM required  this visit?  No      Plan:     I have personally reviewed and noted the following in the patient's chart:   Medical and social history Use of alcohol, tobacco or illicit drugs  Current medications and supplements including opioid prescriptions. Patient is currently taking opioid prescriptions. Information provided to patient regarding non-opioid alternatives. Patient advised to discuss non-opioid treatment plan with their provider. Functional ability and status Nutritional status Physical activity Advanced directives List of other physicians Hospitalizations, surgeries, and ER visits in previous 12 months Vitals Screenings to include cognitive, depression, and falls Referrals and appointments  In addition, I have reviewed and discussed with patient certain preventive protocols, quality metrics, and best practice recommendations. A written personalized care plan for preventive services as well as general preventive health recommendations were provided to patient.     Criselda Peaches, LPN   [id number]   Nurse Notes: None

## 2022-08-06 ENCOUNTER — Inpatient Hospital Stay (HOSPITAL_BASED_OUTPATIENT_CLINIC_OR_DEPARTMENT_OTHER)
Admission: EM | Admit: 2022-08-06 | Discharge: 2022-08-11 | DRG: 562 | Disposition: A | Discharge status: Skilled Nursing Facility | Payer: Medicare Other | Attending: Internal Medicine | Admitting: Internal Medicine

## 2022-08-06 ENCOUNTER — Emergency Department (HOSPITAL_BASED_OUTPATIENT_CLINIC_OR_DEPARTMENT_OTHER): Payer: Medicare Other | Admitting: Radiology

## 2022-08-06 ENCOUNTER — Emergency Department (HOSPITAL_BASED_OUTPATIENT_CLINIC_OR_DEPARTMENT_OTHER): Payer: Medicare Other

## 2022-08-06 ENCOUNTER — Other Ambulatory Visit: Payer: Self-pay

## 2022-08-06 DIAGNOSIS — Z803 Family history of malignant neoplasm of breast: Secondary | ICD-10-CM

## 2022-08-06 DIAGNOSIS — Z79899 Other long term (current) drug therapy: Secondary | ICD-10-CM

## 2022-08-06 DIAGNOSIS — Z881 Allergy status to other antibiotic agents status: Secondary | ICD-10-CM

## 2022-08-06 DIAGNOSIS — W06XXXA Fall from bed, initial encounter: Secondary | ICD-10-CM | POA: Diagnosis present

## 2022-08-06 DIAGNOSIS — S8262XA Displaced fracture of lateral malleolus of left fibula, initial encounter for closed fracture: Secondary | ICD-10-CM | POA: Diagnosis present

## 2022-08-06 DIAGNOSIS — M7551 Bursitis of right shoulder: Secondary | ICD-10-CM | POA: Diagnosis present

## 2022-08-06 DIAGNOSIS — M109 Gout, unspecified: Secondary | ICD-10-CM | POA: Diagnosis present

## 2022-08-06 DIAGNOSIS — Z86010 Personal history of colonic polyps: Secondary | ICD-10-CM

## 2022-08-06 DIAGNOSIS — Z87891 Personal history of nicotine dependence: Secondary | ICD-10-CM

## 2022-08-06 DIAGNOSIS — S82145A Nondisplaced bicondylar fracture of left tibia, initial encounter for closed fracture: Secondary | ICD-10-CM | POA: Diagnosis not present

## 2022-08-06 DIAGNOSIS — S82142A Displaced bicondylar fracture of left tibia, initial encounter for closed fracture: Secondary | ICD-10-CM | POA: Diagnosis present

## 2022-08-06 DIAGNOSIS — E1151 Type 2 diabetes mellitus with diabetic peripheral angiopathy without gangrene: Secondary | ICD-10-CM | POA: Diagnosis present

## 2022-08-06 DIAGNOSIS — W19XXXA Unspecified fall, initial encounter: Secondary | ICD-10-CM

## 2022-08-06 DIAGNOSIS — Z823 Family history of stroke: Secondary | ICD-10-CM

## 2022-08-06 DIAGNOSIS — M542 Cervicalgia: Secondary | ICD-10-CM | POA: Diagnosis present

## 2022-08-06 DIAGNOSIS — Z7982 Long term (current) use of aspirin: Secondary | ICD-10-CM

## 2022-08-06 DIAGNOSIS — Z885 Allergy status to narcotic agent status: Secondary | ICD-10-CM

## 2022-08-06 DIAGNOSIS — J9601 Acute respiratory failure with hypoxia: Secondary | ICD-10-CM | POA: Diagnosis not present

## 2022-08-06 DIAGNOSIS — S92415A Nondisplaced fracture of proximal phalanx of left great toe, initial encounter for closed fracture: Secondary | ICD-10-CM | POA: Diagnosis present

## 2022-08-06 DIAGNOSIS — M7122 Synovial cyst of popliteal space [Baker], left knee: Secondary | ICD-10-CM

## 2022-08-06 DIAGNOSIS — Z96611 Presence of right artificial shoulder joint: Secondary | ICD-10-CM | POA: Diagnosis present

## 2022-08-06 DIAGNOSIS — Z83719 Family history of colon polyps, unspecified: Secondary | ICD-10-CM

## 2022-08-06 DIAGNOSIS — M25511 Pain in right shoulder: Secondary | ICD-10-CM

## 2022-08-06 DIAGNOSIS — H548 Legal blindness, as defined in USA: Secondary | ICD-10-CM | POA: Diagnosis present

## 2022-08-06 DIAGNOSIS — R2689 Other abnormalities of gait and mobility: Secondary | ICD-10-CM | POA: Diagnosis present

## 2022-08-06 DIAGNOSIS — Z751 Person awaiting admission to adequate facility elsewhere: Secondary | ICD-10-CM

## 2022-08-06 DIAGNOSIS — E119 Type 2 diabetes mellitus without complications: Secondary | ICD-10-CM

## 2022-08-06 DIAGNOSIS — E1142 Type 2 diabetes mellitus with diabetic polyneuropathy: Secondary | ICD-10-CM | POA: Diagnosis present

## 2022-08-06 DIAGNOSIS — R296 Repeated falls: Secondary | ICD-10-CM | POA: Diagnosis present

## 2022-08-06 DIAGNOSIS — E785 Hyperlipidemia, unspecified: Secondary | ICD-10-CM | POA: Diagnosis present

## 2022-08-06 DIAGNOSIS — E8770 Fluid overload, unspecified: Secondary | ICD-10-CM | POA: Diagnosis not present

## 2022-08-06 DIAGNOSIS — Y92009 Unspecified place in unspecified non-institutional (private) residence as the place of occurrence of the external cause: Secondary | ICD-10-CM

## 2022-08-06 DIAGNOSIS — Z7984 Long term (current) use of oral hypoglycemic drugs: Secondary | ICD-10-CM

## 2022-08-06 DIAGNOSIS — Z8249 Family history of ischemic heart disease and other diseases of the circulatory system: Secondary | ICD-10-CM

## 2022-08-06 DIAGNOSIS — I10 Essential (primary) hypertension: Secondary | ICD-10-CM | POA: Diagnosis present

## 2022-08-06 LAB — CBC WITH DIFFERENTIAL/PLATELET
Abs Immature Granulocytes: 0.03 10*3/uL (ref 0.00–0.07)
Basophils Absolute: 0 10*3/uL (ref 0.0–0.1)
Basophils Relative: 0 %
Eosinophils Absolute: 0.5 10*3/uL (ref 0.0–0.5)
Eosinophils Relative: 3 %
HCT: 40.8 % (ref 39.0–52.0)
Hemoglobin: 14 g/dL (ref 13.0–17.0)
Immature Granulocytes: 0 %
Lymphocytes Relative: 14 %
Lymphs Abs: 1.9 10*3/uL (ref 0.7–4.0)
MCH: 30.2 pg (ref 26.0–34.0)
MCHC: 34.3 g/dL (ref 30.0–36.0)
MCV: 88.1 fL (ref 80.0–100.0)
Monocytes Absolute: 1.5 10*3/uL — ABNORMAL HIGH (ref 0.1–1.0)
Monocytes Relative: 11 %
Neutro Abs: 10.3 10*3/uL — ABNORMAL HIGH (ref 1.7–7.7)
Neutrophils Relative %: 72 %
Platelets: 230 10*3/uL (ref 150–400)
RBC: 4.63 MIL/uL (ref 4.22–5.81)
RDW: 13.7 % (ref 11.5–15.5)
WBC: 14.2 10*3/uL — ABNORMAL HIGH (ref 4.0–10.5)
nRBC: 0 % (ref 0.0–0.2)

## 2022-08-06 LAB — COMPREHENSIVE METABOLIC PANEL
ALT: 12 U/L (ref 0–44)
AST: 17 U/L (ref 15–41)
Albumin: 3.8 g/dL (ref 3.5–5.0)
Alkaline Phosphatase: 222 U/L — ABNORMAL HIGH (ref 38–126)
Anion gap: 9 (ref 5–15)
BUN: 9 mg/dL (ref 8–23)
CO2: 25 mmol/L (ref 22–32)
Calcium: 9.1 mg/dL (ref 8.9–10.3)
Chloride: 101 mmol/L (ref 98–111)
Creatinine, Ser: 0.83 mg/dL (ref 0.61–1.24)
GFR, Estimated: >60 mL/min (ref >60)
Glucose, Bld: 194 mg/dL — ABNORMAL HIGH (ref 70–99)
Potassium: 4.1 mmol/L (ref 3.5–5.1)
Sodium: 135 mmol/L (ref 135–145)
Total Bilirubin: 0.8 mg/dL (ref 0.3–1.2)
Total Protein: 6.8 g/dL (ref 6.5–8.1)

## 2022-08-06 MED ORDER — HYDROCODONE-ACETAMINOPHEN 5-325 MG PO TABS: 1.0000 | ORAL_TABLET | ORAL | 0 refills | Status: DC | PRN

## 2022-08-06 MED ORDER — HYDROCODONE-ACETAMINOPHEN 5-325 MG PO TABS
1.0000 | ORAL_TABLET | Freq: Once | ORAL | Status: AC
  Administered 2022-08-06: 1 via ORAL
  Filled 2022-08-06: qty 1

## 2022-08-06 MED ORDER — KETOROLAC TROMETHAMINE 30 MG/ML IJ SOLN
30.0000 mg | Freq: Once | INTRAMUSCULAR | Status: DC
  Filled 2022-08-06: qty 1

## 2022-08-06 MED ORDER — KETOROLAC TROMETHAMINE 60 MG/2ML IM SOLN
60.0000 mg | Freq: Once | INTRAMUSCULAR | Status: AC
  Administered 2022-08-06: 60 mg via INTRAMUSCULAR
  Filled 2022-08-06: qty 2

## 2022-08-06 NOTE — Progress Notes (Signed)
Hospitalist Transfer Note:  Transferring facility: [facility] Requesting provider: Dr. Billy Fischer (EDP at [facility]) Reason for transfer: admission for further evaluation and management of acute left tibial plateau fx after ground level mechanical fall at home.      82  year old male who presented to [facility] ED complaining of  left lower extremity pain after ground-level mechanical fall at home earlier in the day, without hitting his head, and without any associated loss of consciousness not on any blood thinners.  While initial x-rays were negative, and CT CT scan revealed evidence of left tibial plateau fracture.  He also has a fracture of the toe on the left foot.   EDP at [facility] d/w Dr. Percell Miller of the [facility]. Dr. Percell Miller to formally consult/see patient in the AM (3/25) and requests [place] admission to [place] signs in the ED were notable for the following: afebrile, normotensive.   Subsequently, I accepted this patient for transfer for inpatient admission to a med-surg bed at [facility] for further work-up and management of the above .      Nursing staff, Please call [place] number on Amion [phone]) as soon as patient's arrival, so appropriate admitting provider can evaluate the pt.     Babs Bertin, DO Hospitalist

## 2022-08-06 NOTE — ED Triage Notes (Signed)
Patient arrives with complaints of right shoulder pain and left ankle pain due to a mechanical fall 2 days ago. Rates pain a 9/10. Patient had his right shoulder replaced 4 years ago.

## 2022-08-06 NOTE — ED Provider Notes (Signed)
  Physical Exam  BP (!) 140/60   Pulse 70   Temp 98 F (36.7 C) (Oral)   Resp 17   Ht 5\' 9"  (1.753 m)   Wt 117.5 kg   SpO2 95%   BMI 38.25 kg/m   Physical Exam  Procedures  Procedures  ED Course / MDM   Received care of patient from Dr. Kingsley Callander.  Please see his note for prior history, physical and care.  Briefly this is a an 82 year old male who fell out of bed 2 nights ago with right shoulder pain, left leg pain, inability to ambulate.  Had x-rays obtained with Dr. Oswaldo Milian including ankle, foot, knee, and humerus which showed a nondisplaced great toe fracture on the left.  He was reporting continued severe pain in his shoulder, and Dr. Oswaldo Milian has put in a CT shoulder for further evaluation.   CT shoulder returned and showed signs of bursitis, without acute injury.  Again attempted to ambulate Mr. Michaelson.  At the time of bearing weight, he had reported to the tech that he had pain that radiated up into his left hip.  On my history, he reports that his pain was located from his foot, radiating up his tibia.  Ordered additional x-rays of his hip, tibia, and lumbar spine given his continued difficulty bearing weight which seems out of proportion to his great toe fracture.  Family is concerned about ability to care for him at home and he typically lives alone.   His hip x-ray did show question malalignment of his right sacroiliac joint.  He continues to report pain that is severe in his ankle, going up his tibia--ordered CT pelvis, knee, and ankle to evaluate for other occult fracture or impeding his ability to bear weight.   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  It appears his inability to bear weight is secondary to his tibial plateau fracture.  Discussed at this time he will be nonweightbearing, placed in a knee immobilizer.  Given difficulty getting around with walker, will admit for continued care. Discussed with Dr. Percell Miller of orthopedics.  Screening labs ordered and evaluated by me with nonspecific leukocytosis, no anemia, no significant electrolyte abnormalities.         Gareth Morgan, MD 08/06/22 2358

## 2022-08-06 NOTE — ED Notes (Signed)
Patient transported to XR at this time.

## 2022-08-06 NOTE — ED Provider Notes (Signed)
[facility] Provider Note   CSN: [id number] Arrival date & time: 08/06/22  1326     History  Chief Complaint  Patient presents with   Fall   Ankle Pain    Left   Shoulder Pain    right    Kenneth Henson is a 82 y.o. male.  Patient with a history of diabetes, hypertension, hyperlipidemia presenting with left ankle pain and right shoulder pain.  States he fell out of bed 2 nights ago and injured both of these joints.  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  Denies hitting head or losing consciousness.  Denies any neck or back pain.  States has not been able to walk and his family has having to move him around.  Does not take any blood thinners.  He has been taking ibuprofen at home with partial relief.  Denies any focal weakness, numbness or tingling.  No chest pain or shortness of breath.  No head, neck, back, chest or abdominal pain.  No vomiting or fever.  Most of the pain is to his left dorsal foot and ankle as well as lateral ankle.  The history is provided by the patient.  Fall Pertinent negatives include no chest pain, no abdominal pain, no headaches and no shortness of breath.  Ankle Pain Associated symptoms: no fever   Shoulder Pain Associated symptoms: no fever        Home Medications Prior to Admission medications   Medication Sig Start Date End Date Taking? Authorizing Provider  allopurinol (ZYLOPRIM) 300 MG tablet Take 300 mg by mouth daily.    [provider]  aspirin 81 MG tablet Take 81 mg by mouth daily.    [provider]  diclofenac Sodium (VOLTAREN) 1 % GEL Apply 2 g topically 4 (four) times daily. 04/19/22   Tacy Learn, PA-C  gabapentin (NEURONTIN) 300 MG capsule Take 1 tab before bed x 2 weeks.  May increase to 1 tab at dinner for 2 weeks and then may increase to am, dinner and before bed thereafter for pain. 10/03/21   Bronson Ing, DPM  hydrochlorothiazide (HYDRODIURIL) 25  MG tablet Take 25 mg by mouth daily.    [provider]  HYDROcodone-acetaminophen (NORCO/VICODIN) 5-325 MG tablet Take 1 tablet by mouth every 4 (four) hours as needed. 04/19/22   Tacy Learn, PA-C  ibuprofen (ADVIL) 800 MG tablet Take 1 tablet (800 mg total) by mouth every 8 (eight) hours as needed. 07/05/21   de [place], [name], MD  lisinopril (PRINIVIL,ZESTRIL) 40 MG tablet Take 1 tablet (40 mg total) by mouth daily. 05/29/11   Midge Minium, MD  metFORMIN (GLUCOPHAGE) 850 MG tablet Take 1 tablet (850 mg total) by mouth daily. 05/29/11   Midge Minium, MD  mirtazapine (REMERON) 15 MG tablet Take 15 mg by mouth at bedtime. 04/04/22   [provider]  ONE TOUCH ULTRA TEST test strip USE AS DIRECTED 06/01/12   Midge Minium, MD  [facility] DELICA LANCETS MISC by Does not apply route 2 (two) times daily.    [provider]  oxyCODONE-acetaminophen (PERCOCET/ROXICET) 5-325 MG tablet Take 1 tablet by mouth every 6 (six) hours as needed for severe pain. Patient not taking: Reported on 10/21/2021 10/18/21   Isla Pence, MD  predniSONE (DELTASONE) 20 MG tablet Take 1 tablet (20 mg total) by mouth daily with breakfast. 10/21/21   [place], [name], MD  sildenafil (VIAGRA) 100  MG tablet Take 1 tablet (100 mg total) by mouth as needed for erectile dysfunction. 01/05/11 12/01/20  Midge Minium, MD  simvastatin (ZOCOR) 20 MG tablet Take 1 tablet (20 mg total) by mouth daily. 02/24/11   Midge Minium, MD  temazepam (RESTORIL) 30 MG capsule Take 1 capsule (30 mg total) by mouth at bedtime as needed. 08/03/11   Ann Held, DO  traMADol (ULTRAM) 50 MG tablet Take 1 tablet (50 mg total) by mouth every 6 (six) hours as needed. 10/21/21   de [place], [name], MD      Allergies    Doxycycline and Oxycodone-acetaminophen    Review of Systems   Review of Systems  Constitutional:  Negative for activity change, appetite change and fever.  HENT:  Negative for  congestion and rhinorrhea.   Respiratory:  Negative for cough, chest tightness and shortness of breath.   Cardiovascular:  Negative for chest pain.  Gastrointestinal:  Negative for abdominal pain, nausea and vomiting.  Genitourinary:  Negative for dysuria.  Musculoskeletal:  Positive for arthralgias and myalgias.  Skin:  Negative for rash.  Neurological:  Negative for dizziness, weakness and headaches.   all other systems are negative except as noted in the HPI and PMH.    Physical Exam Updated Vital Signs BP (!) 159/83 (BP Location: Left Arm)   Pulse 93   Temp 98 F (36.7 C) (Oral)   Resp 18   Ht 5\' 9"  (1.753 m)   Wt 117.5 kg   SpO2 96%   BMI 38.25 kg/m  Physical Exam Vitals and nursing note reviewed.  Constitutional:      General: He is not in acute distress.    Appearance: He is well-developed.  HENT:     Head: Normocephalic and atraumatic.     Mouth/Throat:     Pharynx: No oropharyngeal exudate.  Eyes:     Conjunctiva/sclera: Conjunctivae normal.     Pupils: Pupils are equal, round, and reactive to light.  Neck:     Comments: No midline C-spine tenderness Cardiovascular:     Rate and Rhythm: Normal rate and regular rhythm.     Heart sounds: Normal heart sounds. No murmur heard. Pulmonary:     Effort: Pulmonary effort is normal. No respiratory distress.     Breath sounds: Normal breath sounds.  Abdominal:     Palpations: Abdomen is soft.     Tenderness: There is no abdominal tenderness. There is no guarding or rebound.  Musculoskeletal:        General: Tenderness present. Normal range of motion.     Cervical back: Normal range of motion and neck supple.     Comments: Left lateral malleolus tenderness, ankle joint tenderness diffusely without step-off or deformity.  Able to wiggle toes.  Intact DP and PT pulses.  Full range of motion bilateral hips and knees.  Compartments are soft  Pain to palpation of right anterior lateral shoulder, worse with movement.  No  deformity.  Intact radial pulse  Intact DP and PT pulse with Doppler.  Skin:    General: Skin is warm.  Neurological:     Mental Status: He is alert and oriented to person, place, and time.     Cranial Nerves: No cranial nerve deficit.     Motor: No abnormal muscle tone.     Coordination: Coordination normal.     Comments: No ataxia on finger to nose bilaterally. No pronator drift. 5/5 strength throughout. CN 2-12 intact.Equal grip strength.  Sensation intact.   Psychiatric:        Behavior: Behavior normal.     ED Results / Procedures / Treatments   Labs (all labs ordered are listed, but only abnormal results are displayed) Labs Reviewed - No data to display  EKG None  Radiology DG Knee 2 Views Left  Result Date: 08/06/2022 CLINICAL DATA:  Left knee pain after fall. EXAM: LEFT KNEE - 1-2 VIEW COMPARISON:  None Available. FINDINGS: No fracture or dislocation is noted. Small suprapatellar joint effusion is noted. Moderate narrowing of medial and lateral joint spaces are noted. IMPRESSION: Moderate degenerative joint disease. Small suprapatellar joint effusion. No fracture or dislocation. Electronically Signed   By: Marijo Conception M.D.   On: 08/06/2022 15:19   DG Foot Complete Left  Result Date: 08/06/2022 CLINICAL DATA:  Left foot pain after fall 2 days ago. EXAM: LEFT FOOT - COMPLETE 3+ VIEW COMPARISON:  None Available. FINDINGS: Nondisplaced fracture is seen involving the proximal base of the first proximal phalanx. No other fracture or dislocation is noted. Mild posterior calcaneal spurring is noted. IMPRESSION: Nondisplaced fracture involving proximal base of first proximal phalanx. Electronically Signed   By: Marijo Conception M.D.   On: 08/06/2022 15:18   DG Humerus Right  Result Date: 08/06/2022 CLINICAL DATA:  Right shoulder pain after fall 2 days ago. EXAM: RIGHT HUMERUS - 2+ VIEW COMPARISON:  None Available. FINDINGS: Status post right shoulder arthroplasty. No fracture or  dislocation is noted. IMPRESSION: No acute abnormality seen. Electronically Signed   By: Marijo Conception M.D.   On: 08/06/2022 15:15   DG Ankle Left Port  Result Date: 08/06/2022 CLINICAL DATA:  Left ankle pain after fall 2 days ago. EXAM: PORTABLE LEFT ANKLE - 2 VIEW COMPARISON:  None Available. FINDINGS: There is no evidence of fracture, dislocation, or joint effusion. There is no evidence of arthropathy or other focal bone abnormality. Soft tissues are unremarkable. IMPRESSION: Negative. Electronically Signed   By: Marijo Conception M.D.   On: 08/06/2022 14:00    Procedures Procedures    Medications Ordered in ED Medications  HYDROcodone-acetaminophen (NORCO/VICODIN) 5-325 MG per tablet 1 tablet (has no administration in time range)    ED Course/ Medical Decision Making/ A&P                             Medical Decision Making Amount and/or Complexity of Data Reviewed Labs: ordered. Decision-making details documented in ED Course. Radiology: ordered and independent interpretation performed. Decision-making details documented in ED Course. ECG/medicine tests: ordered and independent interpretation performed. Decision-making details documented in ED Course.  Risk Prescription drug management.   Fall 2 days ago with right shoulder and left ankle pain.  No head injury or loss of consciousness.  No neck or back pain.  X-ray of ankle obtained in triage is negative for acute fracture or dislocation.  Results reviewed and interpreted by me.  There is a nondisplaced great toe fracture on the left.  X-ray of left knee and right shoulder are negative as well.  Suspect pain secondary to left great toe fracture.  Will place in cam walker boot.  Patient does have walker and cane at home.  On attempted discharge, complaining of increased pain to his right shoulder and unable to move it.  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  Will proceed with CT scan to rule out occult  abnormalities.  Care to  be transferred at shift change.        Final Clinical Impression(s) / ED Diagnoses Final diagnoses:  Fall, initial encounter  Nondisplaced fracture of proximal phalanx of left great toe, initial encounter for closed fracture    Rx / DC Orders ED Discharge Orders     None         Devion Chriscoe, Annie Main, MD 08/06/22 1549

## 2022-08-06 NOTE — ED Notes (Signed)
-  [facility]ed [facility] for transportation at 1142pm.

## 2022-08-06 NOTE — ED Notes (Signed)
-  Called [facility] for Hospitalist consult at 1112pm, spoke to Delmita.

## 2022-08-06 NOTE — ED Notes (Signed)
Ambulation attempted... Unable to ambulate... Provider aware.Marland KitchenMarland Kitchen

## 2022-08-06 NOTE — Discharge Instructions (Addendum)
X-ray shows great toe fracture on the left side.  Wear the walking boot and follow-up with the orthopedic doctor for recheck this week.  Return to the ED with new or worsening symptoms.

## 2022-08-07 ENCOUNTER — Encounter (HOSPITAL_COMMUNITY): Payer: Self-pay | Admitting: Internal Medicine

## 2022-08-07 ENCOUNTER — Other Ambulatory Visit (HOSPITAL_COMMUNITY): Payer: TRICARE For Life (TFL)

## 2022-08-07 DIAGNOSIS — E1151 Type 2 diabetes mellitus with diabetic peripheral angiopathy without gangrene: Secondary | ICD-10-CM | POA: Diagnosis present

## 2022-08-07 DIAGNOSIS — E119 Type 2 diabetes mellitus without complications: Secondary | ICD-10-CM | POA: Diagnosis not present

## 2022-08-07 DIAGNOSIS — M25511 Pain in right shoulder: Secondary | ICD-10-CM

## 2022-08-07 DIAGNOSIS — Z7984 Long term (current) use of oral hypoglycemic drugs: Secondary | ICD-10-CM | POA: Diagnosis not present

## 2022-08-07 DIAGNOSIS — Z79899 Other long term (current) drug therapy: Secondary | ICD-10-CM | POA: Diagnosis not present

## 2022-08-07 DIAGNOSIS — Z803 Family history of malignant neoplasm of breast: Secondary | ICD-10-CM | POA: Diagnosis not present

## 2022-08-07 DIAGNOSIS — R296 Repeated falls: Secondary | ICD-10-CM | POA: Diagnosis present

## 2022-08-07 DIAGNOSIS — Y92009 Unspecified place in unspecified non-institutional (private) residence as the place of occurrence of the external cause: Secondary | ICD-10-CM | POA: Diagnosis not present

## 2022-08-07 DIAGNOSIS — S92415A Nondisplaced fracture of proximal phalanx of left great toe, initial encounter for closed fracture: Secondary | ICD-10-CM

## 2022-08-07 DIAGNOSIS — Z881 Allergy status to other antibiotic agents status: Secondary | ICD-10-CM | POA: Diagnosis not present

## 2022-08-07 DIAGNOSIS — Z7982 Long term (current) use of aspirin: Secondary | ICD-10-CM | POA: Diagnosis not present

## 2022-08-07 DIAGNOSIS — R2689 Other abnormalities of gait and mobility: Secondary | ICD-10-CM | POA: Diagnosis present

## 2022-08-07 DIAGNOSIS — Z823 Family history of stroke: Secondary | ICD-10-CM | POA: Diagnosis not present

## 2022-08-07 DIAGNOSIS — W19XXXA Unspecified fall, initial encounter: Secondary | ICD-10-CM | POA: Diagnosis not present

## 2022-08-07 DIAGNOSIS — Z86010 Personal history of colonic polyps: Secondary | ICD-10-CM | POA: Diagnosis not present

## 2022-08-07 DIAGNOSIS — E785 Hyperlipidemia, unspecified: Secondary | ICD-10-CM | POA: Diagnosis present

## 2022-08-07 DIAGNOSIS — E1142 Type 2 diabetes mellitus with diabetic polyneuropathy: Secondary | ICD-10-CM | POA: Diagnosis present

## 2022-08-07 DIAGNOSIS — Z885 Allergy status to narcotic agent status: Secondary | ICD-10-CM | POA: Diagnosis not present

## 2022-08-07 DIAGNOSIS — J9601 Acute respiratory failure with hypoxia: Secondary | ICD-10-CM | POA: Diagnosis not present

## 2022-08-07 DIAGNOSIS — Z8249 Family history of ischemic heart disease and other diseases of the circulatory system: Secondary | ICD-10-CM | POA: Diagnosis not present

## 2022-08-07 DIAGNOSIS — W06XXXA Fall from bed, initial encounter: Secondary | ICD-10-CM | POA: Diagnosis present

## 2022-08-07 DIAGNOSIS — H548 Legal blindness, as defined in USA: Secondary | ICD-10-CM | POA: Diagnosis present

## 2022-08-07 DIAGNOSIS — I10 Essential (primary) hypertension: Secondary | ICD-10-CM

## 2022-08-07 DIAGNOSIS — S8262XA Displaced fracture of lateral malleolus of left fibula, initial encounter for closed fracture: Secondary | ICD-10-CM | POA: Diagnosis present

## 2022-08-07 DIAGNOSIS — Z83719 Family history of colon polyps, unspecified: Secondary | ICD-10-CM | POA: Diagnosis not present

## 2022-08-07 DIAGNOSIS — S82142A Displaced bicondylar fracture of left tibia, initial encounter for closed fracture: Secondary | ICD-10-CM | POA: Diagnosis not present

## 2022-08-07 DIAGNOSIS — M7122 Synovial cyst of popliteal space [Baker], left knee: Secondary | ICD-10-CM | POA: Diagnosis present

## 2022-08-07 DIAGNOSIS — M109 Gout, unspecified: Secondary | ICD-10-CM | POA: Diagnosis present

## 2022-08-07 DIAGNOSIS — M7551 Bursitis of right shoulder: Secondary | ICD-10-CM | POA: Diagnosis present

## 2022-08-07 DIAGNOSIS — R9431 Abnormal electrocardiogram [ECG] [EKG]: Secondary | ICD-10-CM | POA: Diagnosis not present

## 2022-08-07 DIAGNOSIS — S82145A Nondisplaced bicondylar fracture of left tibia, initial encounter for closed fracture: Secondary | ICD-10-CM | POA: Diagnosis present

## 2022-08-07 DIAGNOSIS — Z87891 Personal history of nicotine dependence: Secondary | ICD-10-CM | POA: Diagnosis not present

## 2022-08-07 LAB — GLUCOSE, CAPILLARY
Glucose-Capillary: 142 mg/dL — ABNORMAL HIGH (ref 70–99)
Glucose-Capillary: 126 mg/dL — ABNORMAL HIGH (ref 70–99)
Glucose-Capillary: 160 mg/dL — ABNORMAL HIGH (ref 70–99)
Glucose-Capillary: 134 mg/dL — ABNORMAL HIGH (ref 70–99)
Glucose-Capillary: 155 mg/dL — ABNORMAL HIGH (ref 70–99)

## 2022-08-07 MED ORDER — ENOXAPARIN SODIUM 40 MG/0.4ML IJ SOSY: 40.0000 mg | PREFILLED_SYRINGE | INTRAMUSCULAR | Status: DC

## 2022-08-07 MED ORDER — HYDROCODONE-ACETAMINOPHEN 5-325 MG PO TABS
1.0000 | ORAL_TABLET | Freq: Four times a day (QID) | ORAL | Status: DC | PRN
  Administered 2022-08-07 – 2022-08-11 (×13): 1 via ORAL
  Filled 2022-08-07 (×13): qty 1

## 2022-08-07 MED ORDER — INSULIN ASPART 100 UNIT/ML IJ SOLN: 0.0000 [IU] | Freq: Every day | INTRAMUSCULAR | Status: DC

## 2022-08-07 MED ORDER — SODIUM CHLORIDE 0.9 % IV SOLN: INTRAVENOUS | Status: DC

## 2022-08-07 MED ORDER — ACETAMINOPHEN 650 MG RE SUPP: 650.0000 mg | Freq: Four times a day (QID) | RECTAL | Status: DC | PRN

## 2022-08-07 MED ORDER — ENOXAPARIN SODIUM 40 MG/0.4ML IJ SOSY
40.0000 mg | PREFILLED_SYRINGE | INTRAMUSCULAR | Status: DC
  Administered 2022-08-07 – 2022-08-10 (×4): 40 mg via SUBCUTANEOUS
  Filled 2022-08-07 (×4): qty 0.4

## 2022-08-07 MED ORDER — SIMVASTATIN 20 MG PO TABS
20.0000 mg | ORAL_TABLET | Freq: Every day | ORAL | Status: DC
  Administered 2022-08-07 – 2022-08-11 (×5): 20 mg via ORAL
  Filled 2022-08-07 (×5): qty 1

## 2022-08-07 MED ORDER — GABAPENTIN 100 MG PO CAPS
200.0000 mg | ORAL_CAPSULE | Freq: Two times a day (BID) | ORAL | Status: DC
  Administered 2022-08-07 – 2022-08-11 (×9): 200 mg via ORAL
  Filled 2022-08-07 (×9): qty 2

## 2022-08-07 MED ORDER — ACETAMINOPHEN 325 MG PO TABS
650.0000 mg | ORAL_TABLET | Freq: Four times a day (QID) | ORAL | Status: DC | PRN
  Administered 2022-08-07 – 2022-08-10 (×2): 650 mg via ORAL
  Filled 2022-08-07 (×2): qty 2

## 2022-08-07 MED ORDER — INSULIN ASPART 100 UNIT/ML IJ SOLN
0.0000 [IU] | INTRAMUSCULAR | Status: DC
  Administered 2022-08-07 (×2): 2 [IU] via SUBCUTANEOUS
  Administered 2022-08-07: 3 [IU] via SUBCUTANEOUS
  Administered 2022-08-07: 2 [IU] via SUBCUTANEOUS

## 2022-08-07 MED ORDER — ALUM & MAG HYDROXIDE-SIMETH 200-200-20 MG/5ML PO SUSP: 30.0000 mL | ORAL | Status: DC | PRN

## 2022-08-07 MED ORDER — INSULIN ASPART 100 UNIT/ML IJ SOLN
0.0000 [IU] | Freq: Three times a day (TID) | INTRAMUSCULAR | Status: DC
  Administered 2022-08-08: 2 [IU] via SUBCUTANEOUS
  Administered 2022-08-09: 3 [IU] via SUBCUTANEOUS
  Administered 2022-08-10 (×2): 2 [IU] via SUBCUTANEOUS

## 2022-08-07 MED ORDER — SENNOSIDES-DOCUSATE SODIUM 8.6-50 MG PO TABS: 1.0000 | ORAL_TABLET | Freq: Every evening | ORAL | Status: DC | PRN

## 2022-08-07 MED ORDER — LISINOPRIL 20 MG PO TABS
40.0000 mg | ORAL_TABLET | Freq: Every day | ORAL | Status: DC
  Administered 2022-08-07 – 2022-08-11 (×5): 40 mg via ORAL
  Filled 2022-08-07 (×5): qty 2

## 2022-08-07 MED ORDER — TEMAZEPAM 15 MG PO CAPS
30.0000 mg | ORAL_CAPSULE | Freq: Once | ORAL | Status: AC | PRN
  Administered 2022-08-07: 30 mg via ORAL
  Filled 2022-08-07: qty 2

## 2022-08-07 MED ORDER — ONDANSETRON HCL 4 MG/2ML IJ SOLN: 4.0000 mg | Freq: Four times a day (QID) | INTRAMUSCULAR | Status: DC | PRN

## 2022-08-07 MED ORDER — MORPHINE SULFATE (PF) 2 MG/ML IV SOLN
2.0000 mg | INTRAVENOUS | Status: DC | PRN
  Administered 2022-08-07 – 2022-08-10 (×3): 2 mg via INTRAVENOUS
  Filled 2022-08-07 (×3): qty 1

## 2022-08-07 MED ORDER — ONDANSETRON HCL 4 MG PO TABS: 4.0000 mg | ORAL_TABLET | Freq: Four times a day (QID) | ORAL | Status: DC | PRN

## 2022-08-07 NOTE — Care Plan (Signed)
This 82 years old male with PMH significant for legal blindness, hypertension, hyperlipidemia, diabetes mellitus, gout, peripheral neuropathy and gait instability with falls.  Patient fell off the bed and reports his left knee hit the bed and he fell hard.  Patient could not walk following fall and has severe pain in his ankle and tibia.  Imaging revealed left lateral plateuae fracture.  Orthopedics consulted, None of these injuries require surgical intervention.  Left toe and left knee fracture should heal on their own.  Right shoulder likely hurts from contusions from the fall.  Recommended ice and elevation of all areas.  Nonweightbearing RUE LLE.  Patient was seen and examined at bedside. He reports pain is reasonably controlled.

## 2022-08-07 NOTE — Evaluation (Signed)
Physical Therapy Evaluation Patient Details Name: Kenneth Henson MRN: [id number] DOB: 1940/08/29 Today's Date: 08/07/2022  History of Present Illness  This is a pleasant 82 year old male with past medical history of legal blindness, HTN, HLD, DM, gout, peripheral neuropathy, R reverse TSA, and instability with falls.  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 Moderate-large volume of fluid within the subacromial-subdeltoid bursal space suggesting bursitis  Clinical Impression  Pt admitted with above diagnosis.  Pt currently with functional limitations due to the deficits listed below (see PT Problem List). Pt will benefit from acute skilled PT to increase their independence and safety with mobility to allow discharge.       The patient was able to mobilize to sitting with min assist. Stood at bed edge with RW and 2 mod assist, able to scoot on right foot to turn to recliner. Patient did well with NWB on LLE. Patient  will most likely remain  transfers only  while NWB on LLE.   Recommendations for follow up therapy are one component of a multi-disciplinary discharge planning process, led by the attending physician.  Recommendations may be updated based on patient status, additional functional criteria and insurance authorization.  Follow Up Recommendations       Assistance Recommended at Discharge Frequent or constant Supervision/Assistance  Patient can return home with the following  Two people to help with walking and/or  transfers;A lot of help with bathing/dressing/bathroom;Assist for transportation;Help with stairs or ramp for entrance    Equipment Recommendations None recommended by PT  Recommendations for Other Services       Functional Status Assessment Patient has had a recent decline in their functional status and demonstrates the ability to make significant improvements in function in a reasonable and predictable amount of time.     Precautions / Restrictions Precautions Precautions: Fall Precaution Comments: CAM boot on L Required Braces or Orthoses: Splint/Cast;Knee Immobilizer - Left Restrictions Weight Bearing Restrictions: Yes LLE Weight Bearing: Non weight bearing      Mobility  Bed Mobility Overal bed mobility: Needs Assistance Bed Mobility: Supine to Sit     Supine to sit: Min assist     General bed mobility comments: assist with LLE, able to  scot to bed edge    Transfers Overall transfer level: Needs assistance Equipment used: Rolling walker (2 wheels) Transfers: Sit to/from Stand, Bed to chair/wheelchair/BSC Sit to Stand: Mod assist, +2 physical assistance, +2 safety/equipment Stand pivot transfers: Mod assist, +2 physical assistance, +2 safety/equipment         General transfer comment: patient standing and maintained NWB on LLE, able to scoot around on right foot to turn to recliner, support LLE  to lift from floor during stand to sit.    Ambulation/Gait                  Stairs            Wheelchair Mobility    Modified Rankin (Stroke Patients Only)       Balance Overall balance assessment: Needs assistance, History of Falls Sitting-balance support: Bilateral upper extremity supported, Feet supported Sitting balance-Leahy Scale:  Fair     Standing balance support: Bilateral upper extremity supported, During functional activity, Reliant on assistive device for balance Standing balance-Leahy Scale: Poor                                Pertinent Vitals/Pain Pain Assessment Pain Assessment: Faces Pain Score: 8  Pain Location: L knee Pain Descriptors / Indicators: Discomfort Pain Intervention(s): Limited activity within patient's tolerance, Premedicated before session, Patient requesting pain meds-RN notified    Home Living Family/patient expects to be discharged to:: Private residence Living Arrangements: Alone Available Help at Discharge: Available PRN/intermittently Type of Home: Apartment Home Access: Level entry       Home Layout: One level Home Equipment: Rollator (4 wheels);Cane - single point      Prior Function Prior Level of Function : Independent/Modified Independent             Mobility Comments: legally blind ADLs Comments: IADL's     Hand Dominance   Dominant Hand: Right    Extremity/Trunk Assessment   Upper Extremity Assessment Upper Extremity Assessment: RUE deficits/detail RUE Deficits / Details: limited shoudler elevation    Lower Extremity Assessment Lower Extremity Assessment: LLE deficits/detail LLE Deficits / Details: in KI and Cam boot, able to lift leg from bed    Cervical / Trunk Assessment Cervical / Trunk Assessment: Normal  Communication   Communication: No difficulties  Cognition Arousal/Alertness: Awake/alert Behavior During Therapy: WFL for tasks assessed/performed Overall Cognitive Status: Within Functional Limits for tasks assessed                                          General Comments      Exercises     Assessment/Plan    PT Assessment Patient needs continued PT services  PT Problem List Decreased activity tolerance;Decreased safety awareness;Pain;Decreased range of motion;Decreased balance;Decreased knowledge of use of DME;Decreased knowledge of precautions       PT Treatment Interventions DME instruction;Therapeutic activities;Gait training;Functional mobility training;Therapeutic exercise;Patient/family education     PT Goals (Current goals can be found in the Care Plan section)  Acute Rehab PT Goals Patient Stated Goal: to go home PT Goal Formulation: With patient/family Time For Goal Achievement: 08/21/22 Potential to Achieve Goals: Good    Frequency Min 2X/week     Co-evaluation               AM-PAC PT "6 Clicks" Mobility  Outcome Measure Help needed turning from your back to your side while in a flat bed without using bedrails?: A Little Help needed moving from lying on your back to sitting on the side of a flat bed without using bedrails?: A Little Help needed moving to and from a bed to a chair (including a wheelchair)?: A Lot Help needed standing up from a chair using your arms (e.g., wheelchair or bedside chair)?: A Lot Help needed to walk in hospital room?: Total Help needed climbing 3-5 steps with a railing? : Total 6 Click Score: 12    End of Session Equipment Utilized During Treatment: Gait belt;Left knee immobilizer Activity Tolerance: Patient tolerated treatment well Patient left: in chair;with call bell/phone within reach;with chair alarm set Nurse Communication: Mobility status PT Visit Diagnosis: Unsteadiness on feet (R26.81);Other abnormalities of gait and mobility (R26.89);History of falling (Z91.81)    Time: 1144-1200 PT  Time Calculation (min) (ACUTE ONLY): 16 min   Charges:   PT Evaluation $PT Eval Low Complexity: 1 Low          [place] Office [phone] Weekend [id number]   Claretha Cooper 08/07/2022, 12:48 PM

## 2022-08-07 NOTE — Plan of Care (Signed)

## 2022-08-07 NOTE — H&P (Addendum)
**Note Kenneth-Identified via Obfuscation** PCP:   Kenneth Henson, Kenneth Henson, Henson   Chief Complaint:  Fall, neck pain, inability to ambulate  HPI: This is a pleasant 82 year old male with past medical history of legal blindness, HTN, HLD, DM, gout, peripheral neuropathy, and instability with falls.  On Friday night he took his son-in-law's out to a comedy club.  On returning home he had a few drinks, went to bed.  Around 1:30 AM he fell off the bed.  His knee hit the bed board, he fell hard.  He could not walk as he has severe pain in his ankle and tibia.  On Saturday he hurts all day, he soak the foot in Epsom salt and took ibuprofen.  On Sunday was not any better.  His daughter insisted he go to the [facility].  Imaging revealed left lateral plateau fracture and moderate to larger volume of fluid in the right subacromial subdeltoid bursal space.  EDP consulted with Orthopod Dr. Percell Henson who recommended transfer to [facility]:  The patient denies anorexia, fever, weight loss,, vision loss, decreased hearing, hoarseness, chest pain, syncope, dyspnea on exertion, peripheral edema, hemoptysis, abdominal pain, melena, hematochezia, severe indigestion/heartburn, hematuria, incontinence, genital sores, suspicious skin lesions, transient blindness,  depression, unusual weight change, abnormal bleeding, enlarged lymph nodes, angioedema, and breast masses. Positives: Fall, pain right shoulder, left leg, inability to ambulate  Past Medical History: Past Medical History:  Diagnosis Date   Abnormal involuntary movements(781.0)    Arthritis    Chicken pox    Claudication ([place])    Colon polyps    Diabetes mellitus type II    Erectile dysfunction    Hyperlipidemia    Hypertension    Neoplasm of skin    uncertain behavior   Past Surgical History:  Procedure Laterality Date   CARPAL TUNNEL RELEASE     right   HAND SURGERY     KNEE SURGERY     NECK SURGERY     SHOULDER SURGERY Right    2020   SHOULDER SURGERY Left    2011     Medications: Prior to Admission medications   Medication Sig Start Date End Date Taking? Authorizing Provider  HYDROcodone-acetaminophen (NORCO/VICODIN) 5-325 MG tablet Take 1 tablet by mouth every 4 (four) hours as needed. 08/06/22  Yes Henson, Kenneth Main, Henson  allopurinol (ZYLOPRIM) 300 MG tablet Take 300 mg by mouth daily.    Provider, Historical, Henson  aspirin 81 MG tablet Take 81 mg by mouth daily.    Provider, Historical, Henson  diclofenac Sodium (VOLTAREN) 1 % GEL Apply 2 g topically 4 (four) times daily. 04/19/22   Kenneth Learn, Henson  gabapentin (NEURONTIN) 300 MG capsule Take 1 tab before bed x 2 weeks.  May increase to 1 tab at dinner for 2 weeks and then may increase to am, dinner and before bed thereafter for pain. 10/03/21   Kenneth Henson, Henson  hydrochlorothiazide (HYDRODIURIL) 25 MG tablet Take 25 mg by mouth daily.    Provider, Historical, Henson  ibuprofen (ADVIL) 800 MG tablet Take 1 tablet (800 mg total) by mouth every 8 (eight) hours as needed. 07/05/21   Kenneth Henson, Kenneth Reveal, Henson  lisinopril (PRINIVIL,ZESTRIL) 40 MG tablet Take 1 tablet (40 mg total) by mouth daily. 05/29/11   Kenneth Minium, Henson  metFORMIN (GLUCOPHAGE) 850 MG tablet Take 1 tablet (850 mg total) by mouth daily. 05/29/11   Kenneth Minium, Henson  mirtazapine (REMERON) 15 MG tablet Take 15 mg  by mouth at bedtime. 04/04/22   Provider, Historical, Henson  ONE TOUCH ULTRA TEST test strip USE AS DIRECTED 06/01/12   Kenneth Minium, Henson  [facility] DELICA LANCETS MISC by Does not apply route 2 (two) times daily.    Provider, Historical, Henson  oxyCODONE-acetaminophen (PERCOCET/ROXICET) 5-325 MG tablet Take 1 tablet by mouth every 6 (six) hours as needed for severe pain. Patient not taking: Reported on 10/21/2021 10/18/21   Kenneth Pence, Henson  predniSONE (DELTASONE) 20 MG tablet Take 1 tablet (20 mg total) by mouth daily with breakfast. 10/21/21   Kenneth Henson, Kenneth Reveal, Henson  sildenafil (VIAGRA) 100 MG tablet Take 1 tablet (100 mg total)  by mouth as needed for erectile dysfunction. 01/05/11 12/01/20  Kenneth Minium, Henson  simvastatin (ZOCOR) 20 MG tablet Take 1 tablet (20 mg total) by mouth daily. 02/24/11   Kenneth Minium, Henson  temazepam (RESTORIL) 30 MG capsule Take 1 capsule (30 mg total) by mouth at bedtime as needed. 08/03/11   Kenneth Held, Henson  traMADol (ULTRAM) 50 MG tablet Take 1 tablet (50 mg total) by mouth every 6 (six) hours as needed. 10/21/21   Kenneth Henson, Kenneth Henson, Henson    Allergies:   Allergies  Allergen Reactions   Doxycycline Nausea And Vomiting   Oxycodone-Acetaminophen Itching    Social History:  reports that he quit smoking about 13 years ago. His smoking use included cigarettes. He has never used smokeless tobacco. He reports current alcohol use of about 3.0 standard drinks of alcohol per week. He reports that he does not use drugs.  Family History: Family History  Problem Relation Age of Onset   Hypertension Mother    Heart disease Mother    Stroke Father    Colon cancer Neg Hx    Stomach cancer Neg Hx    Breast cancer Sister    Colon polyps Sister     Physical Exam: Vitals:   08/06/22 2232 08/06/22 2300 08/06/22 2315 08/07/22 0222  BP:  (!) 131/54 (!) 140/60 (!) 149/67  Pulse:  70  68  Resp:  (!) 24 17 18   Temp: 98 F (36.7 C)   98.2 F (36.8 C)  TempSrc: Oral   Oral  SpO2:  95%  95%  Weight:      Height:        General:  Alert and oriented times three, well developed and nourished, no acute distress Eyes: Pink conjunctiva, no scleral icterus ENT: Moist oral mucosa, neck supple, no thyromegaly Lungs: clear to ascultation, no wheeze, no crackles, no use of accessory muscles Cardiovascular: regular rate and rhythm, no regurgitation, no gallops, no murmurs. No carotid bruits, no JVD Abdomen: soft, positive BS, non-tender, non-distended, no organomegaly, not an acute abdomen GU: not examined Neuro: CN II - XII grossly intact,  Musculoskeletal: Right shoulder swollen with  effusion, decreased range of motion secondary to pain.  Left ankle diffusely tender.  Tenderness of the left lateral malleolus.  Strength 5/5 on all other extremities Skin: no rash, no subcutaneous crepitation, no decubitus Psych: appropriate patient   Labs on Admission:  Recent Labs    08/06/22 2204  NA 135  K 4.1  CL 101  CO2 25  GLUCOSE 194*  BUN 9  CREATININE 0.83  CALCIUM 9.1   Recent Labs    08/06/22 2204  AST 17  ALT 12  ALKPHOS 222*  BILITOT 0.8  PROT 6.8  ALBUMIN 3.8    Recent Labs  08/06/22 2204  WBC 14.2*  NEUTROABS 10.3*  HGB 14.0  HCT 40.8  MCV 88.1  PLT 230      Radiological Exams on Admission: CT Ankle Left Wo Contrast  Result Date: 08/06/2022 CLINICAL DATA:  Left ankle trauma. EXAM: CT OF THE LEFT ANKLE WITHOUT CONTRAST TECHNIQUE: Multidetector CT imaging of the left ankle was performed according to the standard protocol. Multiplanar CT image reconstructions were also generated. RADIATION DOSE REDUCTION: This exam was performed according to the departmental dose-optimization program which includes automated exposure control, adjustment of the mA and/or kV according to patient size and/or use of iterative reconstruction technique. COMPARISON:  Left ankle series earlier today. FINDINGS: Bones/Joint/Cartilage There is osteopenia and a very small nondisplaced chip fracture of the anterior surface of the lateral malleolus on 3:72 and 7:32. There is no further evidence of fractures. No primary pathologic bone lesion is seen. There is mild-to-moderate joint space loss at the ankle and trace spurring at the joint margins but no erosive arthropathy. There are mild features of nonerosive midfoot arthrosis and small noninflammatory dorsal and plantar calcaneal enthesopathic spurs with small dystrophic calcification in the proximal plantar fascia but no fascial thickening. Narrowing and trace spurring is seen of the subtalar and calcaneocuboid joints and there is a  small accessory os peroneum alongside the inferolateral aspect of the cuboid. Also noted is a bipartite medial hallux sesamoid bone. Ligaments Suboptimally assessed by CT. Muscles and Tendons There are mild atrophic changes in the intrinsic musculature of the distal foreleg and plantar foot. No muscular hematoma or mass is seen. The major tendons are not well studied with CT but appear grossly intact as far as seen. Soft tissues There is mild-to-moderate generalized edema at the ankle extending from the anterior distal foreleg. No underlying hematoma is seen. No focal fluid collections. IMPRESSION: 1. Very small nondisplaced chip fracture of the anterior surface of the lateral malleolus. 2. Osteopenia and degenerative changes without further evidence of acute injury. 3. Generalized soft tissue swelling. 4. Mild atrophic changes in the intrinsic musculature. Electronically Signed   By: Telford Nab M.D.   On: 08/06/2022 21:48   CT Knee Left Wo Contrast  Result Date: 08/06/2022 CLINICAL DATA:  Suprapatellar bursal effusion on left knee series today, fall injury with suspected occult fracture. EXAM: CT OF THE LEFT KNEE WITHOUT CONTRAST TECHNIQUE: Multidetector CT imaging of the left knee was performed according to the standard protocol. Multiplanar CT image reconstructions were also generated. RADIATION DOSE REDUCTION: This exam was performed according to the departmental dose-optimization program which includes automated exposure control, adjustment of the mA and/or kV according to patient size and/or use of iterative reconstruction technique. COMPARISON:  Left knee series earlier today. Left tibia fibula series earlier today. No prior cross-sectional imaging. FINDINGS: Bones/Joint/Cartilage There is osteopenia.  There is no primary pathologic bone lesion. There is an acute slightly depressed fracture of the posterior aspect of the lateral tibial plateau, with an underlying linear longitudinal nondisplaced  fracture line extending from the articular surface into metaphysis best seen on sagittal series 7 images thirty and 31, series 3 axial 81. There is no further evidence for fractures. The patella is intact. There is a small suprapatellar bursal effusion. The fluid measures 4-14 Hounsfield units below the expected density of hemarthrosis. No fat fluid level is seen. Two osteochondral loose bodies measuring 1.7 cm and 0.8 cm are noted just below the level of the joint line posterior to the lateral plateau and there is a minimally  small popliteal cyst also seen. There is patellofemoral degenerative arthrosis with mild joint space loss and marginal osteophytes. Femorotibial degenerative arthrosis is seen with near bone-on-bone medial femorotibial joint space loss, moderate lateral compartment narrowing and mild marginal osteophytosis. 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 Ligaments Suboptimally assessed by CT. Muscles and Tendons No acute abnormality. The extensor mechanism is intact. Other area tendons not as well seen but probably intact. There is mild partial fatty atrophy in the distal thigh and proximal foreleg musculature. No muscular hematoma is evident. Soft tissues There are minimal scattered calcific plaques in the superficial femoral, popliteal and popliteal trifurcation arteries. Mild stranding edema is seen in the subcutaneous plane anteriorly. There is no subcutaneous hematoma. IMPRESSION: 1. Acute slightly depressed fracture of the posterior aspect of the lateral tibial plateau, with underlying nondisplaced longitudinal fracture line extending from the articular surface into the metaphysis. 2. Osteopenia and degenerative change. 3. Small suprapatellar bursal effusion. The density of the fluid is below that expected for hemarthrosis. There is no fat fluid level. 4. Two osteochondral loose bodies posterior to the lateral plateau just below  the level of the joint line. 5. Tiny popliteal cyst. Electronically Signed   By: Telford Nab M.D.   On: 08/06/2022 21:30   CT PELVIS WO CONTRAST  Result Date: 08/06/2022 CLINICAL DATA:  Hip trauma.  Concern for fracture. EXAM: CT PELVIS WITHOUT CONTRAST TECHNIQUE: Multidetector CT imaging of the pelvis was performed following the standard protocol without intravenous contrast. RADIATION DOSE REDUCTION: This exam was performed according to the departmental dose-optimization program which includes automated exposure control, adjustment of the mA and/or kV according to patient size and/or use of iterative reconstruction technique. COMPARISON:  Earlier radiograph dated 08/06/2022. FINDINGS: Urinary Tract: Partially visualized 3 mm stone in the inferior pole of the left kidney. The visualized ureters and urinary bladder appear unremarkable. Bowel: There is sigmoid diverticulosis and scattered colonic diverticula without active inflammatory changes. The appendix is normal. Vascular/Lymphatic: Moderate aortoiliac atherosclerotic disease. The visualized IVC is grossly unremarkable on this noncontrast CT. No pelvic adenopathy. Reproductive: The prostate and seminal vesicles are grossly unremarkable. Other:  None Musculoskeletal: Chronic changes of the sacrum with cortical thickening and coarsened trabecula in keeping with Paget's disease. This is relatively similar to the study of 20/11. Degenerative changes of the visualized lower lumbar spine. No acute osseous pathology. IMPRESSION: 1. No acute/traumatic pelvic pathology. 2. Paget's disease of the sacrum. 3. Colonic diverticulosis. 4. Partially visualized 3 mm stone in the inferior pole of the left kidney. 5.  Aortic Atherosclerosis (ICD10-I70.0). Electronically Signed   By: Anner Crete M.D.   On: 08/06/2022 21:17   DG Tibia/Fibula Left  Result Date: 08/06/2022 CLINICAL DATA:  Fall with lumbar and left foreleg pain. EXAM: LEFT TIBIA AND FIBULA - 2 VIEW;  LUMBAR SPINE - COMPLETE 4+ VIEW COMPARISON:  CTA abdomen and pelvis and reconstructions 03/17/2010, CTA runoff and reconstructions 03/17/2010. FINDINGS: Lumbar spine, routine five views: There is a mild grade 1 anterolisthesis of L4-5 most likely due to facet hypertrophy. Again noted is a minimal grade 1 retrolisthesis at L1-2 and L3-4. There is no further listhesis. There is mild chronic wedging of the L3 vertebral body with otherwise normal vertebral heights and no evidence of acute fracture. There is extensive bulky bridging osteophytosis of the lumbar spine. There are degenerative discs T12-L1, L1-2, L2-3 and L3-4 similar to previous exam, normal heights of the L4-5 and L5-S1 discs. Facet joint hypertrophy is  noted throughout, with multilevel degenerative foraminal stenosis progressed from 2011. This appears greatest at L3-4 and L4-5. Both SI joints are patent. 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 The visualized pelvis is unremarkable. There is heavy aortoiliac calcific plaque. Left tibia fibula series: There is mild osteopenia without evidence of fractures or other focal bone lesion. Moderately advanced femorotibial degenerative arthrosis is again seen with meniscal chondrocalcinosis. There is mild-to-moderate degenerative arthrosis of the ankle. There is soft tissue swelling at the ankle. Soft tissues are otherwise unremarkable. IMPRESSION: 1. No evidence of acute lumbar fracture or traumatic lumbar malalignment. 2. Osteopenia without evidence of left foreleg fracture. 3. Stable mild chronic wedging of the L3 vertebral body. 4. 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 5. Aortoiliac atherosclerosis. 6. 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 7. Soft tissue swelling at  the ankle. Electronically Signed   By: Telford Nab M.D.   On: 08/06/2022 20:14   DG Lumbar Spine Complete  Result Date: 08/06/2022 CLINICAL DATA:  Fall with lumbar and left foreleg pain. EXAM: LEFT TIBIA AND FIBULA - 2 VIEW; LUMBAR SPINE - COMPLETE 4+ VIEW COMPARISON:  CTA abdomen and pelvis and reconstructions 03/17/2010, CTA runoff and reconstructions 03/17/2010. FINDINGS: Lumbar spine, routine five views: There is a mild grade 1 anterolisthesis of L4-5 most likely due to facet hypertrophy. Again noted is a minimal grade 1 retrolisthesis at L1-2 and L3-4. There is no further listhesis. There is mild chronic wedging of the L3 vertebral body with otherwise normal vertebral heights and no evidence of acute fracture. There is extensive bulky bridging osteophytosis of the lumbar spine. There are degenerative discs T12-L1, L1-2, L2-3 and L3-4 similar to previous exam, normal heights of the L4-5 and L5-S1 discs. Facet joint hypertrophy is noted throughout, with multilevel degenerative foraminal stenosis progressed from 2011. This appears greatest at L3-4 and L4-5. Both SI joints are patent. 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 The visualized pelvis is unremarkable. There is heavy aortoiliac calcific plaque. Left tibia fibula series: There is mild osteopenia without evidence of fractures or other focal bone lesion. Moderately advanced femorotibial degenerative arthrosis is again seen with meniscal chondrocalcinosis. There is mild-to-moderate degenerative arthrosis of the ankle. There is soft tissue swelling at the ankle. Soft tissues are otherwise unremarkable. IMPRESSION: 1. No evidence of acute lumbar fracture or traumatic lumbar malalignment. 2. Osteopenia without evidence of left foreleg fracture. 3. Stable mild chronic wedging of the L3 vertebral body. 4. Femorotibial  degenerative changes and meniscal chondrocalcinosis. 5. Aortoiliac atherosclerosis. 6. 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 7. Soft tissue swelling at the ankle. Electronically Signed   By: Telford Nab M.D.   On: 08/06/2022 20:14   DG Hip Unilat W or Wo Pelvis 2-3 Views Left  Result Date: 08/06/2022 CLINICAL DATA:  fall EXAM: DG HIP (WITH OR WITHOUT PELVIS) 2-3V LEFT COMPARISON:  CT angiography aorta runoff 03/17/2010 FINDINGS: Limited evaluation due to overlapping osseous structures and overlying soft tissues. There is no evidence of hip fracture or dislocation of the left hip. Frontal view of the right hip unremarkable. No acute displaced fracture of the bones of the pelvis. Question malalignment of the right sacroiliac joint with no definite diastasis. There is no  evidence of arthropathy or aggressive appearing focal bone abnormality. Chronic exostosis along the left inferior pubic rami stable compared to prior. Vascular calcifications IMPRESSION: Question malalignment of the right sacroiliac joint with no definite diastasis. Consider additional pelvic views. Electronically Signed   By: Iven Finn M.D.   On: 08/06/2022 20:05   CT Shoulder Right Wo Contrast  Result Date: 08/06/2022 CLINICAL DATA:  Shoulder trauma, instability or dislocation suspected, xray done. Fall 2 days ago EXAM: CT OF THE UPPER RIGHT EXTREMITY WITHOUT CONTRAST TECHNIQUE: Multidetector CT imaging of the upper right extremity was performed utilizing metal artifact reduction protocol. RADIATION DOSE REDUCTION: This exam was performed according to the departmental dose-optimization program which includes automated exposure control, adjustment of the mA and/or kV according to patient size and/or use of iterative reconstruction technique. COMPARISON:  X-ray 08/06/2022 FINDINGS: Bones/Joint/Cartilage Postsurgical changes following reverse right shoulder  arthroplasty. Arthroplasty components are in their expected alignment without dislocation. No periprosthetic lucency or fracture is identified. No evidence of acromial stress fracture. There is some capsular calcification noted posteriorly. AC joint intact with mild osteoarthritis. Ligaments Suboptimally assessed by CT. Muscles and Tendons Rotator cuff muscle atrophy. Soft tissues Fluid collection in the subacromial-subdeltoid bursal space measuring approximately 7.4 x 1.6 x 4.0 cm (series 7, image 54). No additional fluid collections. No right axillary lymphadenopathy. The included right lung field is clear. Aortic atherosclerosis. IMPRESSION: 1. 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 2. Moderate-large volume of fluid within the subacromial-subdeltoid bursal space suggesting bursitis. Aortic Atherosclerosis (ICD10-I70.0). Electronically Signed   By: Davina Poke D.O.   On: 08/06/2022 17:08   DG Knee 2 Views Left  Result Date: 08/06/2022 CLINICAL DATA:  Left knee pain after fall. EXAM: LEFT KNEE - 1-2 VIEW COMPARISON:  None Available. FINDINGS: No fracture or dislocation is noted. Small suprapatellar joint effusion is noted. Moderate narrowing of medial and lateral joint spaces are noted. IMPRESSION: Moderate degenerative joint disease. Small suprapatellar joint effusion. No fracture or dislocation. Electronically Signed   By: Marijo Conception M.D.   On: 08/06/2022 15:19   DG Foot Complete Left  Result Date: 08/06/2022 CLINICAL DATA:  Left foot pain after fall 2 days ago. EXAM: LEFT FOOT - COMPLETE 3+ VIEW COMPARISON:  None Available. FINDINGS: Nondisplaced fracture is seen involving the proximal base of the first proximal phalanx. No other fracture or dislocation is noted. Mild posterior calcaneal spurring is noted. IMPRESSION: Nondisplaced fracture involving proximal base of first proximal phalanx. Electronically Signed   By: Marijo Conception M.D.   On: 08/06/2022 15:18   DG Humerus Right  Result Date: 08/06/2022 CLINICAL DATA:  Right shoulder pain after fall 2 days ago. EXAM: RIGHT HUMERUS - 2+ VIEW COMPARISON:  None Available. FINDINGS: Status post right shoulder arthroplasty. No fracture or dislocation is noted. IMPRESSION: No acute abnormality seen. Electronically Signed   By: Marijo Conception M.D.   On: 08/06/2022 15:15   DG Ankle Left Port  Result Date: 08/06/2022 CLINICAL DATA:  Left ankle pain after fall 2 days ago. EXAM: PORTABLE LEFT ANKLE - 2 VIEW COMPARISON:  None Available. FINDINGS: There is no evidence of fracture, dislocation, or joint effusion. There is no evidence of arthropathy or other focal bone abnormality. Soft tissues are unremarkable. IMPRESSION: Negative. Electronically Signed   By: Marijo Conception M.D.   On: 08/06/2022 14:00    Assessment/Plan Present on Admission:  Fracture of left tibial plateau  Right shoulder bursitis  Nondisplaced fracture of proximal phalanx of left great toe, initial encounter for closed fracture  -N.p.o., IV fluid hydration -Pain meds as needed -EDP at [facility] d/w Dr. Percell Henson of the [place] [facility].  Dr. Percell Henson to formally consult/see patient in the AM  -PT consult placed -Patient mostly sedentary: MET 4.  2D echo ordered.   Essential hypertension -Stable, resume Norvasc, lisinopril, HCTZ   Hyperlipidemia -Simvastatin resumed   T2DM -Sliding scale insulin initiated   Pagets dz  Kenneth Henson 08/07/2022, 3:59 AM

## 2022-08-07 NOTE — ED Notes (Signed)
Pt report given to IP RN receiving Pt care after transport to [facility].

## 2022-08-07 NOTE — Consult Note (Signed)
ORTHOPAEDIC CONSULTATION  REQUESTING PHYSICIAN: Duard Brady, MD  Chief Complaint: left foot and right shoulder pain  HPI: Kenneth Henson is a 82 y.o. male who complains of pain to the left foot that travels up the leg with weight bearing and pain in the right shoulder. He reports he fell out of bed on Friday and had to call family to help him up. He tried to manage it on his own at home but wasn't getting better so came to the ER.   Imaging shows  - Nondisplaced fracture involving proximal base of left first proximal phalanx - Very small nondisplaced chip fracture of the anterior surface of the left lateral malleolus - Acute slightly depressed fracture of the posterior aspect of the left lateral tibial plateau, with underlying nondisplaced longitudinal fracture line extending from the articular surface into the metaphysis - Postsurgical changes following reverse right shoulder arthroplasty without evidence of hardware complication. 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    No history of MI, CVA, DVT, PE.  Previously ambulatory.  The patient is living at home alone in a 1 story house.    Past Medical History:  Diagnosis Date   Abnormal involuntary movements(781.0)    Arthritis    Chicken pox    Claudication (HCC)    Colon polyps    Diabetes mellitus type II    Erectile dysfunction    Hyperlipidemia    Hypertension    Neoplasm of skin    uncertain behavior   Past Surgical History:  Procedure Laterality Date   CARPAL TUNNEL RELEASE     right   HAND SURGERY     KNEE SURGERY     NECK SURGERY     SHOULDER SURGERY Right    2020   SHOULDER SURGERY Left    2011   Social History   Socioeconomic History   Marital status: Single    Spouse name:  Not on file   Number of children: Not on file   Years of education: Not on file   Highest education level: Not on file  Occupational History   Not on file  Tobacco Use   Smoking status: Former    Types: Cigarettes    Quit date: 05/15/2009    Years since quitting: 13.2   Smokeless tobacco: Never  Substance and Sexual Activity   Alcohol use: Yes    Alcohol/week: 3.0 standard drinks of alcohol    Types: 1 Glasses of wine, 1 Cans of beer, 1 Shots of liquor per week    Comment: social   Drug use: No   Sexual activity: Not on file  Other Topics Concern   Not on file  Social History Narrative   Not on file   Social Determinants of Health   Financial Resource Strain: Low Risk  (06/20/2022)   Overall Financial Resource Strain (CARDIA)    Difficulty of Paying Living Expenses: Not hard at all  Food Insecurity: No Food Insecurity (08/07/2022)   Hunger Vital Sign    Worried About Running Out of Food in the Last Year: Never true    Ran Out of Food in the Last Year: Never true  Transportation Needs: No Transportation Needs (08/07/2022)   PRAPARE - Hydrologist (Medical): No    Lack of Transportation (Non-Medical): No  Physical Activity: Inactive (06/20/2022)   Exercise Vital Sign    Days of Exercise per Week: 0 days    Minutes of Exercise per Session: 0 min  Stress: No Stress Concern Present (06/20/2022)   [place]    Feeling of Stress : Not at all  Social Connections: Moderately Integrated (06/20/2022)   Social Connection and Isolation Panel [NHANES]    Frequency of Communication with Friends and Family: More than three times a week    Frequency of Social Gatherings with Friends and Family: More than three times a week    Attends Religious Services: More than 4 times per year    Active Member of [employer] or Organizations: Yes    Attends Music therapist: More than 4 times per year     Marital Status: Divorced   Family History  Problem Relation Age of Onset   Hypertension Mother    Heart disease Mother    Stroke Father    Colon cancer Neg Hx    Stomach cancer Neg Hx    Breast cancer Sister    Colon polyps Sister    Allergies  Allergen Reactions   Doxycycline Nausea And Vomiting   Oxycodone-Acetaminophen Itching   Prior to Admission medications   Medication Sig Start Date End Date Taking? Authorizing Provider  acetaminophen (TYLENOL) 500 MG tablet Take 1,000 mg by mouth as needed for moderate pain.   Yes [provider]  allopurinol (ZYLOPRIM) 300 MG tablet Take 300 mg by mouth 2 (two) times daily as needed (gout).   Yes [provider]  diclofenac Sodium (VOLTAREN) 1 % GEL Apply 2 g topically 4 (four) times daily. Patient taking differently: Apply 1 Application topically 3 (three) times daily as needed (pain). 04/19/22  Yes Tacy Learn, PA-C  gabapentin (NEURONTIN) 300 MG capsule Take 1 tab before bed x 2 weeks.  May increase to 1 tab at dinner for 2 weeks and then may increase to am, dinner and before bed thereafter for pain. Patient taking differently: Take 300 mg by mouth as needed (nerve pain). 10/03/21  Yes Bronson Ing, DPM  hydrochlorothiazide (HYDRODIURIL) 25 MG tablet Take 25 mg by mouth daily.   Yes [provider]  HYDROcodone-acetaminophen (NORCO/VICODIN) 5-325 MG tablet Take 1 tablet by mouth every 4 (four) hours as needed. 08/06/22  Yes Rancour, Annie Main, MD  ibuprofen (ADVIL) 200 MG tablet Take 400 mg by mouth every 4 (four) hours as needed for moderate pain.   Yes [provider]  LINZESS 145 MCG CAPS capsule Take 145 mcg by mouth as needed (constipation). 07/04/22 07/05/23 Yes [provider]  lisinopril (PRINIVIL,ZESTRIL) 40 MG tablet Take 1 tablet (40 mg total) by mouth daily. 05/29/11  Yes Midge Minium, MD  metFORMIN (GLUCOPHAGE) 850 MG tablet Take 1 tablet (850 mg total) by mouth daily.  05/29/11  Yes Midge Minium, MD  mirtazapine (REMERON) 15 MG tablet Take 15 mg by mouth at bedtime. 04/04/22  Yes [provider]  Multiple Vitamins-Minerals (PRESERVISION AREDS PO) Take 1 tablet by mouth in the morning and at bedtime.   Yes [provider]  sildenafil (VIAGRA) 100 MG tablet Take 1 tablet (100 mg total) by mouth as needed for erectile dysfunction. 01/05/11 08/07/22 Yes Midge Minium, MD  simvastatin (ZOCOR) 20 MG tablet Take 1 tablet (20 mg total) by mouth daily. Patient taking differently: Take 20 mg by mouth daily. When compliant 02/24/11  Yes  Midge Minium, MD  temazepam (RESTORIL) 30 MG capsule Take 1 capsule (30 mg total) by mouth at bedtime as needed. Patient taking differently: Take 30 mg by mouth at bedtime as needed for sleep. 08/03/11  Yes Roma Schanz R, DO  ibuprofen (ADVIL) 800 MG tablet Take 1 tablet (800 mg total) by mouth every 8 (eight) hours as needed. Patient not taking: Reported on 08/07/2022 07/05/21   de [place], [name], MD  oxyCODONE-acetaminophen (PERCOCET/ROXICET) 5-325 MG tablet Take 1 tablet by mouth every 6 (six) hours as needed for severe pain. Patient not taking: Reported on 10/21/2021 10/18/21   Isla Pence, MD  traMADol (ULTRAM) 50 MG tablet Take 1 tablet (50 mg total) by mouth every 6 (six) hours as needed. Patient not taking: Reported on 08/07/2022 10/21/21   [place], [name], MD   CT Ankle Left Wo Contrast  Result Date: 08/06/2022 CLINICAL DATA:  Left ankle trauma. EXAM: CT OF THE LEFT ANKLE WITHOUT CONTRAST TECHNIQUE: Multidetector CT imaging of the left ankle was performed according to the standard protocol. Multiplanar CT image reconstructions were also generated. RADIATION DOSE REDUCTION: This exam was performed according to the departmental dose-optimization program which includes automated exposure control, adjustment of the mA and/or kV according to patient size and/or use of iterative reconstruction  technique. COMPARISON:  Left ankle series earlier today. FINDINGS: Bones/Joint/Cartilage There is osteopenia and a very small nondisplaced chip fracture of the anterior surface of the lateral malleolus on 3:72 and 7:32. There is no further evidence of fractures. No primary pathologic bone lesion is seen. There is mild-to-moderate joint space loss at the ankle and trace spurring at the joint margins but no erosive arthropathy. There are mild features of nonerosive midfoot arthrosis and small noninflammatory dorsal and plantar calcaneal enthesopathic spurs with small dystrophic calcification in the proximal plantar fascia but no fascial thickening. Narrowing and trace spurring is seen of the subtalar and calcaneocuboid joints and there is a small accessory os peroneum alongside the inferolateral aspect of the cuboid. Also noted is a bipartite medial hallux sesamoid bone. Ligaments Suboptimally assessed by CT. Muscles and Tendons There are mild atrophic changes in the intrinsic musculature of the distal foreleg and plantar foot. No muscular hematoma or mass is seen. The major tendons are not well studied with CT but appear grossly intact as far as seen. Soft tissues There is mild-to-moderate generalized edema at the ankle extending from the anterior distal foreleg. No underlying hematoma is seen. No focal fluid collections. IMPRESSION: 1. Very small nondisplaced chip fracture of the anterior surface of the lateral malleolus. 2. Osteopenia and degenerative changes without further evidence of acute injury. 3. Generalized soft tissue swelling. 4. Mild atrophic changes in the intrinsic musculature. Electronically Signed   By: Telford Nab M.D.   On: 08/06/2022 21:48   CT Knee Left Wo Contrast  Result Date: 08/06/2022 CLINICAL DATA:  Suprapatellar bursal effusion on left knee series today, fall injury with suspected occult fracture. EXAM: CT OF THE LEFT KNEE WITHOUT CONTRAST TECHNIQUE: Multidetector CT imaging of the  left knee was performed according to the standard protocol. Multiplanar CT image reconstructions were also generated. RADIATION DOSE REDUCTION: This exam was performed according to the departmental dose-optimization program which includes automated exposure control, adjustment of the mA and/or kV according to patient size and/or use of iterative reconstruction technique. COMPARISON:  Left knee series earlier today. Left tibia fibula series earlier today. No prior cross-sectional imaging. FINDINGS: Bones/Joint/Cartilage There is osteopenia.  There  is no primary pathologic bone lesion. There is an acute slightly depressed fracture of the posterior aspect of the lateral tibial plateau, with an underlying linear longitudinal nondisplaced fracture line extending from the articular surface into metaphysis best seen on sagittal series 7 images thirty and 31, series 3 axial 81. There is no further evidence for fractures. The patella is intact. There is a small suprapatellar bursal effusion. The fluid measures 4-14 Hounsfield units below the expected density of hemarthrosis. No fat fluid level is seen. Two osteochondral loose bodies measuring 1.7 cm and 0.8 cm are noted just below the level of the joint line posterior to the lateral plateau and there is a minimally small popliteal cyst also seen. There is patellofemoral degenerative arthrosis with mild joint space loss and marginal osteophytes. Femorotibial degenerative arthrosis is seen with near bone-on-bone medial femorotibial joint space loss, moderate lateral compartment narrowing and mild marginal osteophytosis. 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 Ligaments Suboptimally assessed by CT. Muscles and Tendons No acute abnormality. The extensor mechanism is intact. Other area tendons not as well seen but probably intact. There is mild partial fatty atrophy in the distal thigh and proximal foreleg  musculature. No muscular hematoma is evident. Soft tissues There are minimal scattered calcific plaques in the superficial femoral, popliteal and popliteal trifurcation arteries. Mild stranding edema is seen in the subcutaneous plane anteriorly. There is no subcutaneous hematoma. IMPRESSION: 1. Acute slightly depressed fracture of the posterior aspect of the lateral tibial plateau, with underlying nondisplaced longitudinal fracture line extending from the articular surface into the metaphysis. 2. Osteopenia and degenerative change. 3. Small suprapatellar bursal effusion. The density of the fluid is below that expected for hemarthrosis. There is no fat fluid level. 4. Two osteochondral loose bodies posterior to the lateral plateau just below the level of the joint line. 5. Tiny popliteal cyst. Electronically Signed   By: Telford Nab M.D.   On: 08/06/2022 21:30   CT PELVIS WO CONTRAST  Result Date: 08/06/2022 CLINICAL DATA:  Hip trauma.  Concern for fracture. EXAM: CT PELVIS WITHOUT CONTRAST TECHNIQUE: Multidetector CT imaging of the pelvis was performed following the standard protocol without intravenous contrast. RADIATION DOSE REDUCTION: This exam was performed according to the departmental dose-optimization program which includes automated exposure control, adjustment of the mA and/or kV according to patient size and/or use of iterative reconstruction technique. COMPARISON:  Earlier radiograph dated 08/06/2022. FINDINGS: Urinary Tract: Partially visualized 3 mm stone in the inferior pole of the left kidney. The visualized ureters and urinary bladder appear unremarkable. Bowel: There is sigmoid diverticulosis and scattered colonic diverticula without active inflammatory changes. The appendix is normal. Vascular/Lymphatic: Moderate aortoiliac atherosclerotic disease. The visualized IVC is grossly unremarkable on this noncontrast CT. No pelvic adenopathy. Reproductive: The prostate and seminal vesicles are  grossly unremarkable. Other:  None Musculoskeletal: Chronic changes of the sacrum with cortical thickening and coarsened trabecula in keeping with Paget's disease. This is relatively similar to the study of 20/11. Degenerative changes of the visualized lower lumbar spine. No acute osseous pathology. IMPRESSION: 1. No acute/traumatic pelvic pathology. 2. Paget's disease of the sacrum. 3. Colonic diverticulosis. 4. Partially visualized 3 mm stone in the inferior pole of the left kidney. 5.  Aortic Atherosclerosis (ICD10-I70.0). Electronically Signed   By: Anner Crete M.D.   On: 08/06/2022 21:17   DG Tibia/Fibula Left  Result Date: 08/06/2022 CLINICAL DATA:  Fall with lumbar and left foreleg pain. EXAM: LEFT TIBIA AND FIBULA -  2 VIEW; LUMBAR SPINE - COMPLETE 4+ VIEW COMPARISON:  CTA abdomen and pelvis and reconstructions 03/17/2010, CTA runoff and reconstructions 03/17/2010. FINDINGS: Lumbar spine, routine five views: There is a mild grade 1 anterolisthesis of L4-5 most likely due to facet hypertrophy. Again noted is a minimal grade 1 retrolisthesis at L1-2 and L3-4. There is no further listhesis. There is mild chronic wedging of the L3 vertebral body with otherwise normal vertebral heights and no evidence of acute fracture. There is extensive bulky bridging osteophytosis of the lumbar spine. There are degenerative discs T12-L1, L1-2, L2-3 and L3-4 similar to previous exam, normal heights of the L4-5 and L5-S1 discs. Facet joint hypertrophy is noted throughout, with multilevel degenerative foraminal stenosis progressed from 2011. This appears greatest at L3-4 and L4-5. Both SI joints are patent. 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 The visualized pelvis is unremarkable. There is heavy aortoiliac calcific plaque. Left tibia fibula series: There is mild osteopenia without  evidence of fractures or other focal bone lesion. Moderately advanced femorotibial degenerative arthrosis is again seen with meniscal chondrocalcinosis. There is mild-to-moderate degenerative arthrosis of the ankle. There is soft tissue swelling at the ankle. Soft tissues are otherwise unremarkable. IMPRESSION: 1. No evidence of acute lumbar fracture or traumatic lumbar malalignment. 2. Osteopenia without evidence of left foreleg fracture. 3. Stable mild chronic wedging of the L3 vertebral body. 4. 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 5. Aortoiliac atherosclerosis. 6. 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 7. Soft tissue swelling at the ankle. Electronically Signed   By: Telford Nab M.D.   On: 08/06/2022 20:14   DG Lumbar Spine Complete  Result Date: 08/06/2022 CLINICAL DATA:  Fall with lumbar and left foreleg pain. EXAM: LEFT TIBIA AND FIBULA - 2 VIEW; LUMBAR SPINE - COMPLETE 4+ VIEW COMPARISON:  CTA abdomen and pelvis and reconstructions 03/17/2010, CTA runoff and reconstructions 03/17/2010. FINDINGS: Lumbar spine, routine five views: There is a mild grade 1 anterolisthesis of L4-5 most likely due to facet hypertrophy. Again noted is a minimal grade 1 retrolisthesis at L1-2 and L3-4. There is no further listhesis. There is mild chronic wedging of the L3 vertebral body with otherwise normal vertebral heights and no evidence of acute fracture. There is extensive bulky bridging osteophytosis of the lumbar spine. There are degenerative discs T12-L1, L1-2, L2-3 and L3-4 similar to previous exam, normal heights of the L4-5 and L5-S1 discs. Facet joint hypertrophy is noted throughout, with multilevel degenerative foraminal stenosis progressed from 2011. This appears greatest at L3-4 and L4-5. Both SI joints are patent. 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 The visualized pelvis is unremarkable. There is heavy aortoiliac calcific plaque. Left tibia fibula series: There is mild osteopenia without evidence of fractures or other focal bone lesion. Moderately advanced femorotibial degenerative arthrosis is again seen with meniscal chondrocalcinosis. There is mild-to-moderate degenerative arthrosis of the ankle. There is soft tissue swelling at the ankle. Soft tissues are otherwise unremarkable. IMPRESSION: 1. No evidence of acute lumbar fracture or traumatic lumbar malalignment. 2. Osteopenia without evidence of left foreleg fracture. 3. Stable mild chronic wedging of the L3 vertebral body. 4. 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 5. Aortoiliac atherosclerosis. 6. Chronic cortical thickening and trabecular coarsening of the sacrum, most likely due to  mono-ostotic Paget's disease. 7. Soft tissue swelling at the ankle. Electronically Signed   By: Telford Nab M.D.   On: 08/06/2022 20:14   DG Hip Unilat W or Wo Pelvis 2-3 Views Left  Result Date: 08/06/2022 CLINICAL DATA:  fall EXAM: DG HIP (WITH OR WITHOUT PELVIS) 2-3V LEFT COMPARISON:  CT angiography aorta runoff 03/17/2010 FINDINGS: Limited evaluation due to overlapping osseous structures and overlying soft tissues. There is no evidence of hip fracture or dislocation of the left hip. Frontal view of the right hip unremarkable. No acute displaced fracture of the bones of the pelvis. Question malalignment of the right sacroiliac joint with no definite diastasis. There is no evidence of arthropathy or aggressive appearing focal bone abnormality. Chronic exostosis along the left inferior pubic rami stable compared to prior. Vascular calcifications IMPRESSION: Question malalignment of the right sacroiliac joint with no definite diastasis. Consider additional pelvic views. Electronically Signed   By:  Iven Finn M.D.   On: 08/06/2022 20:05   CT Shoulder Right Wo Contrast  Result Date: 08/06/2022 CLINICAL DATA:  Shoulder trauma, instability or dislocation suspected, xray done. Fall 2 days ago EXAM: CT OF THE UPPER RIGHT EXTREMITY WITHOUT CONTRAST TECHNIQUE: Multidetector CT imaging of the upper right extremity was performed utilizing metal artifact reduction protocol. RADIATION DOSE REDUCTION: This exam was performed according to the departmental dose-optimization program which includes automated exposure control, adjustment of the mA and/or kV according to patient size and/or use of iterative reconstruction technique. 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 Arthroplasty components are in their expected alignment without dislocation. No periprosthetic lucency or fracture is identified. No evidence of acromial stress fracture. There is some capsular calcification noted posteriorly. AC joint intact with mild osteoarthritis. Ligaments Suboptimally assessed by CT. Muscles and Tendons Rotator cuff muscle atrophy. Soft tissues Fluid collection in the subacromial-subdeltoid bursal space measuring approximately 7.4 x 1.6 x 4.0 cm (series 7, image 54). No additional fluid collections. No right axillary lymphadenopathy. The included right lung field is clear. Aortic atherosclerosis. IMPRESSION: 1. 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 2. Moderate-large volume of fluid within the subacromial-subdeltoid bursal space suggesting bursitis. Aortic Atherosclerosis (ICD10-I70.0). Electronically Signed   By: Davina Poke D.O.   On: 08/06/2022 17:08   DG Knee 2 Views Left  Result Date: 08/06/2022 CLINICAL DATA:  Left knee pain after fall. EXAM: LEFT KNEE - 1-2 VIEW COMPARISON:  None Available. FINDINGS: No fracture or dislocation is noted. Small  suprapatellar joint effusion is noted. Moderate narrowing of medial and lateral joint spaces are noted. IMPRESSION: Moderate degenerative joint disease. Small suprapatellar joint effusion. No fracture or dislocation. Electronically Signed   By: Marijo Conception M.D.   On: 08/06/2022 15:19   DG Foot Complete Left  Result Date: 08/06/2022 CLINICAL DATA:  Left foot pain after fall 2 days ago. EXAM: LEFT FOOT - COMPLETE 3+ VIEW COMPARISON:  None Available. FINDINGS: Nondisplaced fracture is seen involving the proximal base of the first proximal phalanx. No other fracture or dislocation is noted. Mild posterior calcaneal spurring is noted. IMPRESSION: Nondisplaced fracture involving proximal base of first proximal phalanx. Electronically Signed   By: Marijo Conception M.D.   On: 08/06/2022 15:18   DG Humerus Right  Result Date: 08/06/2022 CLINICAL DATA:  Right shoulder pain after fall 2 days ago. EXAM: RIGHT HUMERUS - 2+ VIEW COMPARISON:  None Available. FINDINGS: Status post right shoulder arthroplasty. No fracture or  dislocation is noted. IMPRESSION: No acute abnormality seen. Electronically Signed   By: Marijo Conception M.D.   On: 08/06/2022 15:15   DG Ankle Left Port  Result Date: 08/06/2022 CLINICAL DATA:  Left ankle pain after fall 2 days ago. EXAM: PORTABLE LEFT ANKLE - 2 VIEW COMPARISON:  None Available. FINDINGS: There is no evidence of fracture, dislocation, or joint effusion. There is no evidence of arthropathy or other focal bone abnormality. Soft tissues are unremarkable. IMPRESSION: Negative. Electronically Signed   By: Marijo Conception M.D.   On: 08/06/2022 14:00    Positive ROS: All other systems have been reviewed and were otherwise negative with the exception of those mentioned in the HPI and as above.  Objective: Labs cbc Recent Labs    08/06/22 2204  WBC 14.2*  HGB 14.0  HCT 40.8  PLT 230    Labs inflam No results for input(s): "CRP" in the last 72 hours.  Invalid input(s):  "ESR"  Labs coag No results for input(s): "INR", "PTT" in the last 72 hours.  Invalid input(s): "PT"  Recent Labs    08/06/22 2204  NA 135  K 4.1  CL 101  CO2 25  GLUCOSE 194*  BUN 9  CREATININE 0.83  CALCIUM 9.1    Physical Exam: Vitals:   08/07/22 0554 08/07/22 0936  BP: (!) 153/67 (!) 156/66  Pulse: 71 74  Resp: 18 17  Temp: 98.2 F (36.8 C) 98.4 F (36.9 C)  SpO2: 99% 96%   General: Alert, no acute distress. Laying in bed, calm Mental status: Alert and Oriented x3 Neurologic: Speech Clear and organized, no gross focal findings or movement disorder appreciated. Respiratory: No cyanosis, no use of accessory musculature Cardiovascular: No pedal edema GI: Abdomen is soft and non-tender, non-distended. Skin: Warm and dry.  No lesions in the areas of chief complaint. Extremities: Warm and well perfused w/o edema Psychiatric: Patient is competent for consent with normal mood and affect  MUSCULOSKELETAL:  TTP medial aspect of left forefoot by 1st MTP joint, edema and ecchymosis present, painful ROM 1st toe, NVI TTP medial malleolus > lateral mallelous, mild edema present, no ecchymosis, painful ROM left ankle, NVI TTP lateral joint line left knee, mild effusion, knee ROM slightly limited d/t pain, NVI TTP anterior aspect of right shoulder, edema present to this area, no ecchymosis, limited AROM > PROM, passively I can forward flex him to about 90 degrees, well healed surgical scar, NVI   Other extremities are atraumatic with painless ROM and NVI.  Assessment / Plan: Principal Problem:   Fracture of left tibial plateau Active Problems:   Hyperlipidemia   Essential hypertension   Diabetes mellitus without complication (HCC)   None of these injuries require surgical intervention. Left toe and left knee fractures should heal on their own. Right shoulder likely hurts from contusions from the fall. Ice and elevation to all areas recommended. Since patient lives alone  he mentioned possibly needing SNF. Will wait for PT evaluation.    Weightbearing:  WBAT RUE, NWB LLE   Orthopedic device(s): CAM boot and KI for LLE, sling for comfort for RUE VTE prophylaxis: Lovenox 40mg  qd  , SCDs, ambulation Pain control: looks like he only has Gabapentin, Tylenol, and Morphine ordered; ok to add Norco or Oxy PRN Follow - up plan: 2 weeks in the office  Contact information:  Edmonia Lynch MD, [facility] PA-C  Britt Bottom PA-C Office [phone] 08/07/2022 10:12 AM

## 2022-08-08 ENCOUNTER — Inpatient Hospital Stay (HOSPITAL_COMMUNITY): Payer: Medicare Other

## 2022-08-08 DIAGNOSIS — R9431 Abnormal electrocardiogram [ECG] [EKG]: Secondary | ICD-10-CM

## 2022-08-08 DIAGNOSIS — S82142A Displaced bicondylar fracture of left tibia, initial encounter for closed fracture: Secondary | ICD-10-CM | POA: Diagnosis not present

## 2022-08-08 LAB — BASIC METABOLIC PANEL
Anion gap: 8 (ref 5–15)
BUN: 9 mg/dL (ref 8–23)
CO2: 24 mmol/L (ref 22–32)
Calcium: 8.4 mg/dL — ABNORMAL LOW (ref 8.9–10.3)
Chloride: 104 mmol/L (ref 98–111)
Creatinine, Ser: 0.74 mg/dL (ref 0.61–1.24)
GFR, Estimated: >60 mL/min (ref >60)
Glucose, Bld: 118 mg/dL — ABNORMAL HIGH (ref 70–99)
Potassium: 4.2 mmol/L (ref 3.5–5.1)
Sodium: 136 mmol/L (ref 135–145)

## 2022-08-08 LAB — GLUCOSE, CAPILLARY
Glucose-Capillary: 146 mg/dL — ABNORMAL HIGH (ref 70–99)
Glucose-Capillary: 113 mg/dL — ABNORMAL HIGH (ref 70–99)
Glucose-Capillary: 109 mg/dL — ABNORMAL HIGH (ref 70–99)
Glucose-Capillary: 100 mg/dL — ABNORMAL HIGH (ref 70–99)

## 2022-08-08 LAB — CBC WITH DIFFERENTIAL/PLATELET
Abs Immature Granulocytes: 0.03 10*3/uL (ref 0.00–0.07)
Basophils Absolute: 0 10*3/uL (ref 0.0–0.1)
Basophils Relative: 0 %
Eosinophils Absolute: 0.5 10*3/uL (ref 0.0–0.5)
Eosinophils Relative: 5 %
HCT: 39.9 % (ref 39.0–52.0)
Hemoglobin: 12.8 g/dL — ABNORMAL LOW (ref 13.0–17.0)
Immature Granulocytes: 0 %
Lymphocytes Relative: 19 %
Lymphs Abs: 1.9 10*3/uL (ref 0.7–4.0)
MCH: 29.8 pg (ref 26.0–34.0)
MCHC: 32.1 g/dL (ref 30.0–36.0)
MCV: 92.8 fL (ref 80.0–100.0)
Monocytes Absolute: 1 10*3/uL (ref 0.1–1.0)
Monocytes Relative: 10 %
Neutro Abs: 6.4 10*3/uL (ref 1.7–7.7)
Neutrophils Relative %: 66 %
Platelets: 221 10*3/uL (ref 150–400)
RBC: 4.3 MIL/uL (ref 4.22–5.81)
RDW: 13.9 % (ref 11.5–15.5)
WBC: 9.8 10*3/uL (ref 4.0–10.5)
nRBC: 0 % (ref 0.0–0.2)

## 2022-08-08 LAB — ECHOCARDIOGRAM COMPLETE
Area-P 1/2: 3.31 cm2
Calc EF: 57.4 %
Height: 69 in
S' Lateral: 2.6 cm
Single Plane A2C EF: 56.6 %
Single Plane A4C EF: 56.1 %
Weight: 4144 oz

## 2022-08-08 LAB — HEMOGLOBIN A1C
Hgb A1c MFr Bld: 7.2 % — ABNORMAL HIGH (ref 4.8–5.6)
Mean Plasma Glucose: 160 mg/dL

## 2022-08-08 LAB — PROTIME-INR
INR: 1.1 (ref 0.8–1.2)
Prothrombin Time: 14.2 seconds (ref 11.4–15.2)

## 2022-08-08 MED ORDER — HYDROCODONE-ACETAMINOPHEN 5-325 MG PO TABS: 1.0000 | ORAL_TABLET | Freq: Four times a day (QID) | ORAL | 0 refills | Status: DC | PRN

## 2022-08-08 NOTE — Progress Notes (Signed)
Physical Therapy Treatment Patient Details Name: Kenneth Henson MRN: [id number] DOB: May 04, 1941 Today's Date: 08/08/2022   History of Present Illness This is a pleasant 82 year old male with past medical history of legal blindness, HTN, HLD, DM, gout, peripheral neuropathy, R reverse TSA, and instability with falls.  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 Moderate-large volume of fluid within the subacromial-subdeltoid bursal space suggesting bursitis    PT Comments    Pt tolerated OOB in recliner from 10:30 am till 2:15 pm. Assisted from recliner to EOB.  General transfer comment: patient standing and maintained NWB on LLE, able to scoot around on right foot to turn from recliner to bed, support LLE  to lift from floor during stand to sit.  Assisted NT with a "wash up".  Then assisted back to bed and positioned to comfort.  Ice applied to R shoulder and L knee.   Pt will need ST Rehab at SNF to address mobility and functional decline prior to safely returning home.   Recommendations for follow up therapy are one component of a multi-disciplinary discharge planning process, led by the attending physician.  Recommendations may be updated based on patient status, additional functional criteria and insurance authorization.  Follow Up Recommendations  Can patient physically be transported by private vehicle: No    Assistance Recommended at Discharge Frequent or constant Supervision/Assistance  Patient can return home with  the following Two people to help with walking and/or transfers;A lot of help with bathing/dressing/bathroom;Assist for transportation;Help with stairs or ramp for entrance   Equipment Recommendations  None recommended by PT    Recommendations for Other Services       Precautions / Restrictions Precautions Precautions: Fall Precaution Comments: CAM boot on L Required Braces or Orthoses: Splint/Cast;Knee Immobilizer - Left Knee Immobilizer - Left: On at all times Restrictions Weight Bearing Restrictions: Yes LLE Weight Bearing: Non weight bearing     Mobility  Bed Mobility Overal bed mobility: Needs Assistance Bed Mobility: Sit to Supine     Supine to sit: Mod assist, Max assist     General bed mobility comments: assisted back to bed and positioned to comfort    Transfers Overall transfer level: Needs assistance Equipment used: Rolling walker (2 wheels) Transfers: Sit to/from Stand, Bed to chair/wheelchair/BSC Sit to Stand: Mod assist, +2 physical assistance, +2 safety/equipment Stand pivot transfers: Mod assist, +2 physical assistance, +2 safety/equipment         General transfer comment: patient standing and maintained NWB on LLE, able to scoot around on right foot to turn from recliner to bed, support LLE  to lift from floor during stand to sit.    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   Moved to [place] to be closer to family (Son).        Exercises      General Comments        Pertinent Vitals/Pain Pain Assessment Pain Assessment: Faces Faces Pain Scale: Hurts a little bit Pain Location: L knee 3/10, R shoulder 7/10 Pain Descriptors / Indicators: Discomfort Pain Intervention(s): Repositioned, Ice applied, Premedicated before session, Monitored during session    Home Living                          Prior Function            PT Goals (current goals can now be found in the care plan section) Progress towards PT goals: Progressing toward goals    Frequency    Min 2X/week      PT Plan Current plan remains appropriate    Co-evaluation              AM-PAC PT "6 Clicks" Mobility   Outcome Measure  Help needed turning from your back to your side while in a flat bed without using bedrails?: A Lot Help needed moving from lying on your back to sitting on the side of a flat bed without using bedrails?: A Lot Help needed moving to and from a bed to a chair (including a wheelchair)?: A Lot Help needed standing up from a chair using your arms (e.g., wheelchair or bedside chair)?: A Lot Help needed to walk in hospital room?: Total Help needed climbing 3-5 steps with a railing? : Total 6 Click Score: 10    End of Session Equipment Utilized During Treatment: Gait belt;Left knee immobilizer Activity Tolerance: Patient tolerated treatment well Patient left: in bed;with bed alarm set Nurse Communication: Mobility status PT Visit Diagnosis: Unsteadiness on feet (R26.81);Other abnormalities of gait and mobility (R26.89);History of falling (Z91.81)     Time: 1410-1440 PT Time Calculation (min) (ACUTE ONLY): 30 min  Charges:  $Therapeutic Activity: 23-37 mins                     Rica Koyanagi  PTA Acute  Rehabilitation Services Office M-F          [phone]

## 2022-08-08 NOTE — Progress Notes (Signed)
Physical Therapy Treatment Patient Details Name: Kenneth Henson MRN: [id number] DOB: April 19, 1941 Today's Date: 08/08/2022   History of Present Illness This is a pleasant 82 year old male with past medical history of legal blindness, HTN, HLD, DM, gout, peripheral neuropathy, R reverse TSA, and instability with falls.  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  Moved to [place] to be closer to family (Son). Educated pt on [place] "all times" as well as proper placement.  Pt aware he is NWB.  Assisted OOB to recliner required + 2 assist. General transfer comment: patient standing and maintained NWB on LLE, able to scoot around on right foot to turn to recliner, support LLE  to lift from floor during stand to sit. Positioned in recliner to comfort and applied ICE to R shoulder and L knee.   Pt will need ST Rehab at SNF to address mobility and functional decline prior to safely returning home.   Recommendations for follow up therapy are one component of a multi-disciplinary discharge planning process, led by the attending physician.  Recommendations may be updated based on patient status, additional functional criteria and insurance authorization.  Follow Up Recommendations  Can patient physically be transported by  private vehicle: No    Assistance Recommended at Discharge Frequent or constant Supervision/Assistance  Patient can return home with the following Two people to help with walking and/or transfers;A lot of help with bathing/dressing/bathroom;Assist for transportation;Help with stairs or ramp for entrance   Equipment Recommendations  None recommended by PT    Recommendations for Other Services       Precautions / Restrictions Precautions Precautions: Fall Precaution Comments: CAM boot on L Required Braces or Orthoses: Splint/Cast;Knee Immobilizer - Left Knee Immobilizer - Left: On at all times Restrictions Weight Bearing Restrictions: Yes LLE Weight Bearing: Non weight bearing     Mobility  Bed Mobility Overal bed mobility: Needs Assistance Bed Mobility: Supine to Sit     Supine to sit: Min assist     General bed mobility comments: demonstarted and educated how to use belt to self assist LE    Transfers Overall transfer level: Needs assistance Equipment used: Rolling walker (2 wheels) Transfers: Sit to/from Stand, Bed to chair/wheelchair/BSC Sit to Stand: Mod assist, +2 physical assistance, +2 safety/equipment Stand pivot transfers: Mod assist, +2 physical assistance, +2 safety/equipment         General transfer comment: patient standing and maintained NWB on LLE, able to scoot around on right foot to turn to recliner, support LLE  to lift from floor during stand to sit.    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  Moved to [place] to be closer to family (Son).        Exercises      General Comments        Pertinent Vitals/Pain Pain Assessment Pain Assessment: Faces Faces Pain Scale: Hurts a little bit Pain Location: L knee 3/10, R shoulder 7/10 Pain Descriptors / Indicators: Discomfort Pain Intervention(s): Repositioned, Ice applied, Premedicated before session, Monitored during session    Home Living                          Prior Function            PT Goals (current goals can now be found in the care plan section) Progress towards PT goals: Progressing toward goals    Frequency    Min 2X/week      PT Plan Current plan remains appropriate    Co-evaluation              AM-PAC PT "6 Clicks" Mobility   Outcome Measure  Help needed turning from your back to your side while in a flat bed without using bedrails?: A Lot Help needed moving from lying on your back to sitting on the side of a flat bed without using bedrails?: A Lot Help needed moving to and from a bed to a chair (including a wheelchair)?: A Lot Help needed standing up from a chair using your arms (e.g., wheelchair or bedside chair)?: A Lot Help needed to walk in hospital room?: Total Help needed climbing 3-5 steps with a railing? : Total 6 Click Score: 10    End of Session Equipment Utilized During Treatment: Gait belt;Left knee immobilizer Activity Tolerance: Patient tolerated treatment well Patient left: in chair;with call bell/phone within reach;with chair alarm set Nurse Communication: Mobility status PT Visit Diagnosis: Unsteadiness on feet (R26.81);Other abnormalities of gait and mobility (R26.89);History of falling (Z91.81)     Time: [id number] PT Time Calculation (min) (ACUTE ONLY): 29 min  Charges:  $Therapeutic Activity: 23-37 mins                      {Davonte Siebenaler  PTA Acute  Rehabilitation Services Office M-F          [phone]

## 2022-08-08 NOTE — NC FL2 (Signed)
[place] LEVEL OF CARE FORM     IDENTIFICATION  Patient Name: Kenneth Henson Birthdate: [birth year] Sex: male Admission Date (Current Location): 08/06/2022  [facility] and [place] Number:  Herbalist and Address:  [facility],  [address] [place], [place]      Provider Number: [id number]  Attending Physician Name and Address:  Duard Brady, MD  Relative Name and Phone Number:  Deontra Blasingame (daughter) Ph: [phone]    Current Level of Care: Hospital Recommended Level of Care: [place] Prior Approval Number:    Date Approved/Denied:   PASRR Number: [id number]  Discharge Plan: SNF    Current Diagnoses: Patient Active Problem List   Diagnosis Date Noted   Fracture of left tibial plateau 08/06/2022   Need for influenza vaccination 03/01/2021   Pustular inflammation of skin 12/01/2020   Decreased visual acuity 12/01/2020   Moderately increased albuminuria 12/01/2020   Diabetes mellitus without complication ([place]) [id number]   Vitreomacular adhesion of both eyes 10/27/2020   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 10/27/2020   Exudative age-related macular degeneration of left eye with active choroidal neovascularization ([place]) 10/27/2020   Diabetes ([place]) 09/01/2020   Retinal tear 09/01/2020   Headache(784.0) 01/16/2012   Atypical nevus 11/07/2011   Trigger finger 05/22/2011   Bronchitis 05/22/2011   Preop cardiovascular exam 04/11/2011   Knee pain 02/24/2011   Bursitis of elbow 10/31/2010   OBESITY 04/28/2010   INCONTINENCE, URGE 04/28/2010   LEG PAIN, BILATERAL 12/08/2009   CHEST PAIN 12/08/2009   ELECTROCARDIOGRAM, ABNORMAL 12/08/2009   NEOPLASM, SKIN, UNCERTAIN BEHAVIOR [id number]   PREMATURE ATRIAL CONTRACTIONS 12/06/2009   COLONIC POLYPS, ADENOMATOUS, HX OF 11/17/2009   ERECTILE DYSFUNCTION, 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   Hyperlipidemia  09/29/2009   TOBACCO USE 09/29/2009   Essential hypertension 09/29/2009   SHOULDER, PAIN 09/29/2009   HIP PAIN, LEFT 09/29/2009   ABNORMAL INVOLUNTARY MOVEMENTS 09/29/2009    Orientation RESPIRATION BLADDER Height & Weight     Self, Time, Situation, Place  Normal Incontinent Weight: 259 lb (117.5 kg) Height:  5\' 9"  (175.3 cm)  BEHAVIORAL SYMPTOMS/MOOD NEUROLOGICAL BOWEL NUTRITION STATUS     (N/A) Continent Diet (Carb modified)  AMBULATORY STATUS COMMUNICATION OF NEEDS Skin   Extensive Assist Verbally Normal                       Personal Care Assistance Level of Assistance  Bathing, Feeding, Dressing Bathing Assistance: Maximum assistance Feeding assistance: Limited assistance Dressing Assistance: Maximum assistance     Functional Limitations Info  Sight, Hearing, Speech Sight Info: Impaired Hearing Info: Adequate Speech Info: Adequate    SPECIAL CARE FACTORS FREQUENCY  PT (By licensed PT), OT (By licensed OT)     PT Frequency: 5x's/week OT Frequency: 5x's/week            Contractures Contractures Info: Not present    Additional Factors Info  Code Status, Allergies, Insulin Sliding Scale Code Status Info: Full Allergies Info: Doxycycline, Oxycodone-acetaminophen   Insulin Sliding Scale Info: See discharge summary       Current Medications (08/08/2022):  This is the current hospital active medication list Current Facility-Administered Medications  Medication Dose Route Frequency Provider Last Rate Last Admin   0.9 %  sodium chloride infusion   Intravenous Continuous Crosley, Debby, MD 75 mL/hr at 08/08/22 0300 New Bag at 08/08/22 0300   acetaminophen (TYLENOL) tablet 650 mg  650 mg Oral Q6H PRN  Quintella Baton, MD   650 mg at 08/07/22 1215   Or   acetaminophen (TYLENOL) suppository 650 mg  650 mg Rectal Q6H PRN Quintella Baton, MD       alum & mag hydroxide-simeth (MAALOX/MYLANTA) 200-200-20 MG/5ML suspension 30 mL  30 mL Oral Q4H PRN Crosley, Debby, MD        enoxaparin (LOVENOX) injection 40 mg  40 mg Subcutaneous Q24H Crosley, Debby, MD   40 mg at 08/07/22 1622   gabapentin (NEURONTIN) capsule 200 mg  200 mg Oral BID Crosley, Debby, MD   200 mg at 08/08/22 0850   HYDROcodone-acetaminophen (NORCO/VICODIN) 5-325 MG per tablet 1 tablet  1 tablet Oral Q6H PRN Duard Brady, MD   1 tablet at 08/08/22 0851   insulin aspart (novoLOG) injection 0-15 Units  0-15 Units Subcutaneous TID WC Kathryne Eriksson, NP   2 Units at 08/08/22 0850   insulin aspart (novoLOG) injection 0-5 Units  0-5 Units Subcutaneous QHS Kathryne Eriksson, NP       lisinopril (ZESTRIL) tablet 40 mg  40 mg Oral Daily Crosley, Debby, MD   40 mg at 08/08/22 0851   morphine (PF) 2 MG/ML injection 2 mg  2 mg Intravenous Q4H PRN Quintella Baton, MD   2 mg at 08/07/22 0357   ondansetron (ZOFRAN) tablet 4 mg  4 mg Oral Q6H PRN Quintella Baton, MD       Or   ondansetron (ZOFRAN) injection 4 mg  4 mg Intravenous Q6H PRN Crosley, Debby, MD       senna-docusate (Senokot-S) tablet 1 tablet  1 tablet Oral QHS PRN Crosley, Debby, MD       simvastatin (ZOCOR) tablet 20 mg  20 mg Oral Daily Crosley, Debby, MD   20 mg at 08/08/22 [id number]     Discharge Medications: Please see discharge summary for a list of discharge medications.  Relevant Imaging Results:  Relevant Lab Results:   Additional Information SSN: [id number]  Sherie Don, LCSW

## 2022-08-08 NOTE — Progress Notes (Signed)
Subjective: Patient reports pain as moderate.  Tolerating diet.  Urinating.   No CP, SOB.  Has mobilized some OOB with PT. Pushing himself up from seated position made right shoulder more painful.  Objective:   VITALS:   Vitals:   08/07/22 1247 08/07/22 1827 08/07/22 2104 08/08/22 0500  BP: (!) 154/73 (!) 142/52 (!) 144/78 (!) 136/55  Pulse: 72 65 68 77  Resp: 14 14 17 15   Temp: 98.7 F (37.1 C) 98 F (36.7 C) 99 F (37.2 C) 99.1 F (37.3 C)  TempSrc: Oral Oral Oral Oral  SpO2: 97% 96% 98% 98%  Weight:      Height:          Latest Ref Rng & Units 08/08/2022    3:37 AM 08/06/2022   10:04 PM 09/17/2010    5:00 AM  CBC  WBC 4.0 - 10.5 K/uL 9.8  14.2  11.7   Hemoglobin 13.0 - 17.0 g/dL 12.8  14.0  11.2   Hematocrit 39.0 - 52.0 % 39.9  40.8  32.9   Platelets 150 - 400 K/uL 221  230  175       Latest Ref Rng & Units 08/08/2022    3:37 AM 08/06/2022   10:04 PM 02/24/2011    9:29 AM  BMP  Glucose 70 - 99 mg/dL 118  194  109   BUN 8 - 23 mg/dL 9  9  12    Creatinine 0.61 - 1.24 mg/dL 0.74  0.83  0.9   Sodium 135 - 145 mmol/L 136  135  138   Potassium 3.5 - 5.1 mmol/L 4.2  4.1  4.4   Chloride 98 - 111 mmol/L 104  101  104   CO2 22 - 32 mmol/L 24  25  27    Calcium 8.9 - 10.3 mg/dL 8.4  9.1  9.2    Intake/Output      03/25 0701 03/26 0700 03/26 0701 03/27 0700   P.O. 780 240   I.V. (mL/kg) 1806.3 (15.4)    Total Intake(mL/kg) 2586.3 (22) 240 (2)   Urine (mL/kg/hr) 2000 (0.7)    Stool 0    Total Output 2000    Net +586.3 +240        Stool Occurrence 2 x       Physical Exam: General: NAD.  Sitting up in bedside chair, calm, comfortable Resp: No increased wob Cardio: regular rate and rhythm ABD soft Neurologically intact MSK Neurovascularly intact Sensation intact distally Intact pulses distally Can wiggle toes R shoulder ROM very limited d/t pain KI and CAM boot in place to L leg Ice to right shoulder  Assessment:    Principal Problem:   Fracture of  left tibial plateau Active Problems:   Hyperlipidemia   Essential hypertension   Diabetes mellitus without complication ([place])  Plan:  Advance diet Up with therapy, also work on R shoulder ROM please Incentive Spirometry Elevate and Apply ice  Weightbearing:  WBAT RUE, NWB LLE   Orthopedic device(s): CAM boot and KI for LLE, sling for comfort for RUE VTE prophylaxis: Lovenox 40mg  qd , SCDs, ambulation Pain control: continue current regimen, may add lidocaine patch for R shoulder Follow - up plan: 2 weeks in the office Contact information:  Edmonia Lynch MD, Aggie Moats PA-C  Dispo:  PT recommending SNF and patient agreeable to this since he lives alone. Injuries to multiple limbs (RUE & LLE) make mobilization difficult on his own.   Ok to d/c from ortho  standpoint at any time. Will print and sign pain Rx.    Britt Bottom, PA-C Office [phone] 08/08/2022, 10:52 AM

## 2022-08-08 NOTE — TOC Initial Note (Signed)
Transition of Care [facility]) - Initial/Assessment Note   Patient Details  Name: Kenneth Henson MRN: [id number] Date of Birth: 1940/08/02  [facility] CM/SW Contact:    Sherie Don, LCSW Phone Number: 08/08/2022, 12:26 PM  Clinical Narrative: PT evaluation recommended SNF and patient is agreeable to short-term rehab. FL2 done; PASRR received. Initial referral faxed out. TOC awaiting bed offers.  Expected Discharge Plan: Skilled Nursing Facility Barriers to Discharge: SNF Pending bed offer  Patient Goals and CMS Choice Patient states their goals for this hospitalization and ongoing recovery are:: Go to rehab CMS Medicare.gov Compare Post Acute Care list provided to:: Patient Choice offered to / list presented to : Patient  Expected Discharge Plan and Services In-house Referral: Clinical Social Work Post Acute Care Choice: [place] Living arrangements for the past 2 months: Apartment            DME Arranged: N/A DME Agency: NA  Prior Living Arrangements/Services Living arrangements for the past 2 months: Apartment Patient language and need for interpreter reviewed:: Yes Do you feel safe going back to the place where you live?: Yes      Need for Family Participation in Patient Care: Yes (Comment) Care giver support system in place?: Yes (comment) Criminal Activity/Legal Involvement Pertinent to Current Situation/Hospitalization: No - Comment as needed  Activities of Daily Living Home Assistive Devices/Equipment: Dentures (specify type), Blood pressure cuff, Eyeglasses, Shower chair with back, Cane (specify quad or straight), Walker (specify type) ADL Screening (condition at time of admission) Patient's cognitive ability adequate to safely complete daily activities?: Yes Is the patient deaf or have difficulty hearing?: No Does the patient have difficulty seeing, even when wearing glasses/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) Does the patient have difficulty  concentrating, remembering, or making decisions?: Yes Patient able to express need for assistance with ADLs?: Yes Does the patient have difficulty dressing or bathing?: No Independently performs ADLs?: Yes (appropriate for developmental age) Does the patient have difficulty walking or climbing stairs?: Yes Weakness of Legs: Both Weakness of Arms/Hands: Right  Permission Sought/Granted Permission sought to share information with : Facility Art therapist granted to share information with : Yes, Verbal Permission Granted Permission granted to share info w AGENCY: SNF  Emotional Assessment Appearance:: Appears stated age Attitude/Demeanor/Rapport: Engaged Affect (typically observed): Accepting Orientation: : Oriented to Self, Oriented to Place, Oriented to  Time, Oriented to Situation Alcohol / Substance Use: Not Applicable Psych Involvement: No (comment)  Admission diagnosis:  Synovial cyst of left popliteal space [M71.22] Fall, initial encounter [W19.XXXA] Acute pain of right shoulder [M25.511] Fracture of left tibial plateau [S82.142A] Nondisplaced fracture of proximal phalanx of left great toe, initial encounter for closed fracture [S92.415A] Patient Active Problem List   Diagnosis Date Noted   Fracture of left tibial plateau 08/06/2022   Need for influenza vaccination 03/01/2021   Pustular inflammation of skin 12/01/2020   Decreased visual acuity 12/01/2020   Moderately increased albuminuria 12/01/2020   Diabetes mellitus without complication ([place]) [id number]   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   Hyperlipidemia 09/29/2009   TOBACCO USE 09/29/2009   Essential hypertension 09/29/2009   SHOULDER, PAIN 09/29/2009   HIP PAIN, LEFT 09/29/2009   ABNORMAL INVOLUNTARY MOVEMENTS 09/29/2009   PCP:  [place], [name], MD Pharmacy:   [facility] [id number], [place] [address] Phone: [phone] Fax: [phone]  Social Determinants of Health (SDOH) Social History: SDOH Screenings   Food Insecurity: No Food Insecurity (08/07/2022)  Housing: Low Risk  (08/07/2022)  Transportation Needs: No Transportation Needs (08/07/2022)  Utilities: Not At Risk (08/07/2022)  Alcohol Screen: Low Risk  (06/20/2022)  Depression (PHQ2-9): Low Risk  (06/20/2022)  Financial Resource Strain: Low Risk  (06/20/2022)  Physical Activity: Inactive (06/20/2022)  Social Connections: Moderately Integrated (06/20/2022)  Stress: No Stress Concern Present (06/20/2022)  Tobacco Use: Medium Risk (08/07/2022)   SDOH Interventions:    Readmission Risk Interventions     No data to display

## 2022-08-08 NOTE — Progress Notes (Signed)
PROGRESS NOTE    Kenneth Henson  [id number] DOB: Jun 23, 1940 DOA: 08/06/2022  PCP: [place], [name], MD   Brief Narrative:  This 82 years old male with PMH significant for legal blindness, hypertension, hyperlipidemia, diabetes mellitus, gout, peripheral neuropathy and gait instability with recurrent falls.  Patient fell off the bed and reports his left knee hit the bed and he fell hard.  Patient could not walk following fall and has severe pain in his ankle and tibia.  Imaging revealed left lateral plateuae fracture.  Orthopedics consulted, None of these injuries require surgical intervention.  Left toe and left knee fracture should heal on their own.  Right shoulder likely hurts from contusions from the fall.  Recommended ice and elevation of all areas.  Nonweightbearing RUE LLE. He reports pain is reasonably controlled.  PT/OT recommended SNF  Assessment & Plan:   Principal Problem:   Fracture of left tibial plateau Active Problems:   Hyperlipidemia   Essential hypertension   Diabetes mellitus without complication (HCC)  Left lateral tibial plateau fracture: Nondisplaced fracture of proximal phalanx of left great toe: Patient presented s/p mechanical fall with imaging shows left lateral plateau fracture. Continue adequate pain control. Ortho consulted, states none of these injuries require surgical intervention. Left toe and left knee fracture should heal on their own. Nonweightbearing left lower extremity. PT and OT recommended SNF. Outpatient orthopedic follow-up in 2 weeks  Right shoulder bursitis: Right shoulder likely hurts from contusion from the fall.   Recommended ice and elevation. Weightbearing as tolerated RUE.  Essential hypertension: Continue Norvasc, lisinopril,  hold hydrochlorothiazide  Hyperlipidemia: Continue  simvastatin  Type 2 diabetes: Continue regular insulin sliding scale.  DVT prophylaxis: Lovenox Code Status:Full code Family  Communication: No family at bed side. Disposition Plan:    Status is: Inpatient Remains inpatient appropriate because: Admitted s/p mechanical fall with left lateral plateau fracture,  orthopedics consulted,  none of these require surgical intervention.  PT and OT recommended SNF.   Consultants:  Orthopaedics  Procedures: None  Antimicrobials: None  Subjective: Patient was seen and examined at bedside.  Overnight events noted. Patient continued to complaining about left knee pain.  He reports pain is reasonably controlled. PT and OT recommended SNF awaiting placement.  Objective: Vitals:   08/07/22 1247 08/07/22 1827 08/07/22 2104 08/08/22 0500  BP: (!) 154/73 (!) 142/52 (!) 144/78 (!) 136/55  Pulse: 72 65 68 77  Resp: 14 14 17 15   Temp: 98.7 F (37.1 C) 98 F (36.7 C) 99 F (37.2 C) 99.1 F (37.3 C)  TempSrc: Oral Oral Oral Oral  SpO2: 97% 96% 98% 98%  Weight:      Height:        Intake/Output Summary (Last 24 hours) at 08/08/2022 1110 Last data filed at 08/08/2022 [id number] Gross per 24 hour  Intake 2406.24 ml  Output 1400 ml  Net 1006.24 ml   Filed Weights   08/06/22 1339  Weight: 117.5 kg    Examination:  General exam: Appears calm and comfortable, not in any acute distress Respiratory system: Clear to auscultation. Respiratory effort normal. [id number] Cardiovascular system: S1 & S2 heard, RRR. No JVD, murmurs, rubs, gallops or clicks. No pedal edema. Gastrointestinal system: Abdomen is soft, non tender, non distended, BS+ Central nervous system: Alert and oriented X 3. No focal neurological deficits. Extremities: Right shoulder tender with decreased ROM, left ankle diffusely tender, left lateral malleolus tenderness noted Skin: No rashes, lesions or ulcers Psychiatry: Judgement and insight appear  normal. Mood & affect appropriate.     Data Reviewed: I have personally reviewed following labs and imaging studies  CBC: Recent Labs  Lab 08/06/22 2204  08/08/22 0337  WBC 14.2* 9.8  NEUTROABS 10.3* 6.4  HGB 14.0 12.8*  HCT 40.8 39.9  MCV 88.1 92.8  PLT 230 [id number]   Basic Metabolic Panel: Recent Labs  Lab 08/06/22 2204 08/08/22 0337  NA 135 136  K 4.1 4.2  CL 101 104  CO2 25 24  GLUCOSE 194* 118*  BUN 9 9  CREATININE 0.83 0.74  CALCIUM 9.1 8.4*   GFR: Estimated Creatinine Clearance: 90 mL/min (by C-G formula based on SCr of 0.74 mg/dL). Liver Function Tests: Recent Labs  Lab 08/06/22 2204  AST 17  ALT 12  ALKPHOS 222*  BILITOT 0.8  PROT 6.8  ALBUMIN 3.8   No results for input(s): "LIPASE", "AMYLASE" in the last 168 hours. No results for input(s): "AMMONIA" in the last 168 hours. Coagulation Profile: Recent Labs  Lab 08/08/22 0337  INR 1.1   Cardiac Enzymes: No results for input(s): "CKTOTAL", "CKMB", "CKMBINDEX", "TROPONINI" in the last 168 hours. BNP (last 3 results) No results for input(s): "PROBNP" in the last 8760 hours. HbA1C: Recent Labs    08/07/22 0457  HGBA1C 7.2*   CBG: Recent Labs  Lab 08/07/22 0737 08/07/22 1204 08/07/22 1639 08/07/22 2106 08/08/22 0746  GLUCAP 126* 160* 134* 155* 146*   Lipid Profile: No results for input(s): "CHOL", "HDL", "LDLCALC", "TRIG", "CHOLHDL", "LDLDIRECT" in the last 72 hours. Thyroid Function Tests: No results for input(s): "TSH", "T4TOTAL", "FREET4", "T3FREE", "THYROIDAB" in the last 72 hours. Anemia Panel: No results for input(s): "VITAMINB12", "FOLATE", "FERRITIN", "TIBC", "IRON", "RETICCTPCT" in the last 72 hours. Sepsis Labs: No results for input(s): "PROCALCITON", "LATICACIDVEN" in the last 168 hours.  No results found for this or any previous visit (from the past 240 hour(s)).   Radiology Studies: CT Ankle Left Wo Contrast  Result Date: 08/06/2022 CLINICAL DATA:  Left ankle trauma. EXAM: CT OF THE LEFT ANKLE WITHOUT CONTRAST TECHNIQUE: Multidetector CT imaging of the left ankle was performed according to the standard protocol. Multiplanar CT  image reconstructions were also generated. RADIATION DOSE REDUCTION: This exam was performed according to the departmental dose-optimization program which includes automated exposure control, adjustment of the mA and/or kV according to patient size and/or use of iterative reconstruction technique. COMPARISON:  Left ankle series earlier today. FINDINGS: Bones/Joint/Cartilage There is osteopenia and a very small nondisplaced chip fracture of the anterior surface of the lateral malleolus on 3:72 and 7:32. There is no further evidence of fractures. No primary pathologic bone lesion is seen. There is mild-to-moderate joint space loss at the ankle and trace spurring at the joint margins but no erosive arthropathy. There are mild features of nonerosive midfoot arthrosis and small noninflammatory dorsal and plantar calcaneal enthesopathic spurs with small dystrophic calcification in the proximal plantar fascia but no fascial thickening. Narrowing and trace spurring is seen of the subtalar and calcaneocuboid joints and there is a small accessory os peroneum alongside the inferolateral aspect of the cuboid. Also noted is a bipartite medial hallux sesamoid bone. Ligaments Suboptimally assessed by CT. Muscles and Tendons There are mild atrophic changes in the intrinsic musculature of the distal foreleg and plantar foot. No muscular hematoma or mass is seen. The major tendons are not well studied with CT but appear grossly intact as far as seen. Soft tissues There is mild-to-moderate generalized edema at the ankle extending from  the anterior distal foreleg. No underlying hematoma is seen. No focal fluid collections. IMPRESSION: 1. Very small nondisplaced chip fracture of the anterior surface of the lateral malleolus. 2. Osteopenia and degenerative changes without further evidence of acute injury. 3. Generalized soft tissue swelling. 4. Mild atrophic changes in the intrinsic musculature. Electronically Signed   By: Telford Nab  M.D.   On: 08/06/2022 21:48   CT Knee Left Wo Contrast  Result Date: 08/06/2022 CLINICAL DATA:  Suprapatellar bursal effusion on left knee series today, fall injury with suspected occult fracture. EXAM: CT OF THE LEFT KNEE WITHOUT CONTRAST TECHNIQUE: Multidetector CT imaging of the left knee was performed according to the standard protocol. Multiplanar CT image reconstructions were also generated. RADIATION DOSE REDUCTION: This exam was performed according to the departmental dose-optimization program which includes automated exposure control, adjustment of the mA and/or kV according to patient size and/or use of iterative reconstruction technique. COMPARISON:  Left knee series earlier today. Left tibia fibula series earlier today. No prior cross-sectional imaging. FINDINGS: Bones/Joint/Cartilage There is osteopenia.  There is no primary pathologic bone lesion. There is an acute slightly depressed fracture of the posterior aspect of the lateral tibial plateau, with an underlying linear longitudinal nondisplaced fracture line extending from the articular surface into metaphysis best seen on sagittal series 7 images thirty and 31, series 3 axial 81. There is no further evidence for fractures. The patella is intact. There is a small suprapatellar bursal effusion. The fluid measures 4-14 Hounsfield units below the expected density of hemarthrosis. No fat fluid level is seen. Two osteochondral loose bodies measuring 1.7 cm and 0.8 cm are noted just below the level of the joint line posterior to the lateral plateau and there is a minimally small popliteal cyst also seen. There is patellofemoral degenerative arthrosis with mild joint space loss and marginal osteophytes. Femorotibial degenerative arthrosis is seen with near bone-on-bone medial femorotibial joint space loss, moderate lateral compartment narrowing and mild marginal osteophytosis. Meniscal chondrocalcinosis is additionally seen with faint chondrocalcinosis  of the patellofemoral joint. Ligaments Suboptimally assessed by CT. Muscles and Tendons No acute abnormality. The extensor mechanism is intact. Other area tendons not as well seen but probably intact. There is mild partial fatty atrophy in the distal thigh and proximal foreleg musculature. No muscular hematoma is evident. Soft tissues There are minimal scattered calcific plaques in the superficial femoral, popliteal and popliteal trifurcation arteries. Mild stranding edema is seen in the subcutaneous plane anteriorly. There is no subcutaneous hematoma. IMPRESSION: 1. Acute slightly depressed fracture of the posterior aspect of the lateral tibial plateau, with underlying nondisplaced longitudinal fracture line extending from the articular surface into the metaphysis. 2. Osteopenia and degenerative change. 3. Small suprapatellar bursal effusion. The density of the fluid is below that expected for hemarthrosis. There is no fat fluid level. 4. Two osteochondral loose bodies posterior to the lateral plateau just below the level of the joint line. 5. Tiny popliteal cyst. Electronically Signed   By: Telford Nab M.D.   On: 08/06/2022 21:30   CT PELVIS WO CONTRAST  Result Date: 08/06/2022 CLINICAL DATA:  Hip trauma.  Concern for fracture. EXAM: CT PELVIS WITHOUT CONTRAST TECHNIQUE: Multidetector CT imaging of the pelvis was performed following the standard protocol without intravenous contrast. RADIATION DOSE REDUCTION: This exam was performed according to the departmental dose-optimization program which includes automated exposure control, adjustment of the mA and/or kV according to patient size and/or use of iterative reconstruction technique. COMPARISON:  Earlier radiograph dated  08/06/2022. FINDINGS: Urinary Tract: Partially visualized 3 mm stone in the inferior pole of the left kidney. The visualized ureters and urinary bladder appear unremarkable. Bowel: There is sigmoid diverticulosis and scattered colonic  diverticula without active inflammatory changes. The appendix is normal. Vascular/Lymphatic: Moderate aortoiliac atherosclerotic disease. The visualized IVC is grossly unremarkable on this noncontrast CT. No pelvic adenopathy. Reproductive: The prostate and seminal vesicles are grossly unremarkable. Other:  None Musculoskeletal: Chronic changes of the sacrum with cortical thickening and coarsened trabecula in keeping with Paget's disease. This is relatively similar to the study of 20/11. Degenerative changes of the visualized lower lumbar spine. No acute osseous pathology. IMPRESSION: 1. No acute/traumatic pelvic pathology. 2. Paget's disease of the sacrum. 3. Colonic diverticulosis. 4. Partially visualized 3 mm stone in the inferior pole of the left kidney. 5.  Aortic Atherosclerosis (ICD10-I70.0). Electronically Signed   By: Anner Crete M.D.   On: 08/06/2022 21:17   DG Tibia/Fibula Left  Result Date: 08/06/2022 CLINICAL DATA:  Fall with lumbar and left foreleg pain. EXAM: LEFT TIBIA AND FIBULA - 2 VIEW; LUMBAR SPINE - COMPLETE 4+ VIEW COMPARISON:  CTA abdomen and pelvis and reconstructions 03/17/2010, CTA runoff and reconstructions 03/17/2010. FINDINGS: Lumbar spine, routine five views: There is a mild grade 1 anterolisthesis of L4-5 most likely due to facet hypertrophy. Again noted is a minimal grade 1 retrolisthesis at L1-2 and L3-4. There is no further listhesis. There is mild chronic wedging of the L3 vertebral body with otherwise normal vertebral heights and no evidence of acute fracture. There is extensive bulky bridging osteophytosis of the lumbar spine. There are degenerative discs T12-L1, L1-2, L2-3 and L3-4 similar to previous exam, normal heights of the L4-5 and L5-S1 discs. Facet joint hypertrophy is noted throughout, with multilevel degenerative foraminal stenosis progressed from 2011. This appears greatest at L3-4 and L4-5. Both SI joints are patent. The sacrum again demonstrates diffuse  cortical thickening, trabecular coarsening, patchy sclerosis, not significantly changed and most likely due to mono-ostotic Paget's disease. The visualized pelvis is unremarkable. There is heavy aortoiliac calcific plaque. Left tibia fibula series: There is mild osteopenia without evidence of fractures or other focal bone lesion. Moderately advanced femorotibial degenerative arthrosis is again seen with meniscal chondrocalcinosis. There is mild-to-moderate degenerative arthrosis of the ankle. There is soft tissue swelling at the ankle. Soft tissues are otherwise unremarkable. IMPRESSION: 1. No evidence of acute lumbar fracture or traumatic lumbar malalignment. 2. Osteopenia without evidence of left foreleg fracture. 3. Stable mild chronic wedging of the L3 vertebral body. 4. 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 5. Aortoiliac atherosclerosis. 6. 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 7. Soft tissue swelling at the ankle. Electronically Signed   By: Telford Nab M.D.   On: 08/06/2022 20:14   DG Lumbar Spine Complete  Result Date: 08/06/2022 CLINICAL DATA:  Fall with lumbar and left foreleg pain. EXAM: LEFT TIBIA AND FIBULA - 2 VIEW; LUMBAR SPINE - COMPLETE 4+ VIEW COMPARISON:  CTA abdomen and pelvis and reconstructions 03/17/2010, CTA runoff and reconstructions 03/17/2010. FINDINGS: Lumbar spine, routine five views: There is a mild grade 1 anterolisthesis of L4-5 most likely due to facet hypertrophy. Again noted is a minimal grade 1 retrolisthesis at L1-2 and L3-4. There is no further listhesis. There is mild chronic wedging of the L3 vertebral body with otherwise normal vertebral heights and no evidence of acute fracture. There is extensive bulky bridging osteophytosis of the lumbar spine.  There are degenerative discs T12-L1, L1-2, L2-3 and L3-4 similar to previous exam, normal heights of the L4-5 and  L5-S1 discs. Facet joint hypertrophy is noted throughout, with multilevel degenerative foraminal stenosis progressed from 2011. This appears greatest at L3-4 and L4-5. Both SI joints are patent. 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 The visualized pelvis is unremarkable. There is heavy aortoiliac calcific plaque. Left tibia fibula series: There is mild osteopenia without evidence of fractures or other focal bone lesion. Moderately advanced femorotibial degenerative arthrosis is again seen with meniscal chondrocalcinosis. There is mild-to-moderate degenerative arthrosis of the ankle. There is soft tissue swelling at the ankle. Soft tissues are otherwise unremarkable. IMPRESSION: 1. No evidence of acute lumbar fracture or traumatic lumbar malalignment. 2. Osteopenia without evidence of left foreleg fracture. 3. Stable mild chronic wedging of the L3 vertebral body. 4. 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 5. Aortoiliac atherosclerosis. 6. 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 7. Soft tissue swelling at the ankle. Electronically Signed   By: Telford Nab M.D.   On: 08/06/2022 20:14   DG Hip Unilat W or Wo Pelvis 2-3 Views Left  Result Date: 08/06/2022 CLINICAL DATA:  fall EXAM: DG HIP (WITH OR WITHOUT PELVIS) 2-3V LEFT COMPARISON:  CT angiography aorta runoff 03/17/2010 FINDINGS: Limited evaluation due to overlapping osseous structures and overlying soft tissues. There is no evidence of hip fracture or dislocation of the left hip. Frontal view of the right hip unremarkable. No acute displaced fracture of the bones of the pelvis. Question malalignment of the right sacroiliac joint with no definite diastasis. There is no evidence of arthropathy or aggressive appearing  focal bone abnormality. Chronic exostosis along the left inferior pubic rami stable compared to prior. Vascular calcifications IMPRESSION: Question malalignment of the right sacroiliac joint with no definite diastasis. Consider additional pelvic views. Electronically Signed   By: Iven Finn M.D.   On: 08/06/2022 20:05   CT Shoulder Right Wo Contrast  Result Date: 08/06/2022 CLINICAL DATA:  Shoulder trauma, instability or dislocation suspected, xray done. Fall 2 days ago EXAM: CT OF THE UPPER RIGHT EXTREMITY WITHOUT CONTRAST TECHNIQUE: Multidetector CT imaging of the upper right extremity was performed utilizing metal artifact reduction protocol. RADIATION DOSE REDUCTION: This exam was performed according to the departmental dose-optimization program which includes automated exposure control, adjustment of the mA and/or kV according to patient size and/or use of iterative reconstruction technique. 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 Arthroplasty components are in their expected alignment without dislocation. No periprosthetic lucency or fracture is identified. No evidence of acromial stress fracture. There is some capsular calcification noted posteriorly. AC joint intact with mild osteoarthritis. Ligaments Suboptimally assessed by CT. Muscles and Tendons Rotator cuff muscle atrophy. Soft tissues Fluid collection in the subacromial-subdeltoid bursal space measuring approximately 7.4 x 1.6 x 4.0 cm (series 7, image 54). No additional fluid collections. No right axillary lymphadenopathy. The included right lung field is clear. Aortic atherosclerosis. IMPRESSION: 1. 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 2. Moderate-large volume of fluid within the subacromial-subdeltoid bursal space suggesting bursitis. Aortic Atherosclerosis (ICD10-I70.0).  Electronically Signed   By: Davina Poke D.O.   On: 08/06/2022 17:08   DG Knee 2 Views Left  Result Date: 08/06/2022 CLINICAL DATA:  Left knee pain after fall.  EXAM: LEFT KNEE - 1-2 VIEW COMPARISON:  None Available. FINDINGS: No fracture or dislocation is noted. Small suprapatellar joint effusion is noted. Moderate narrowing of medial and lateral joint spaces are noted. IMPRESSION: Moderate degenerative joint disease. Small suprapatellar joint effusion. No fracture or dislocation. Electronically Signed   By: Marijo Conception M.D.   On: 08/06/2022 15:19   DG Foot Complete Left  Result Date: 08/06/2022 CLINICAL DATA:  Left foot pain after fall 2 days ago. EXAM: LEFT FOOT - COMPLETE 3+ VIEW COMPARISON:  None Available. FINDINGS: Nondisplaced fracture is seen involving the proximal base of the first proximal phalanx. No other fracture or dislocation is noted. Mild posterior calcaneal spurring is noted. IMPRESSION: Nondisplaced fracture involving proximal base of first proximal phalanx. Electronically Signed   By: Marijo Conception M.D.   On: 08/06/2022 15:18   DG Humerus Right  Result Date: 08/06/2022 CLINICAL DATA:  Right shoulder pain after fall 2 days ago. EXAM: RIGHT HUMERUS - 2+ VIEW COMPARISON:  None Available. FINDINGS: Status post right shoulder arthroplasty. No fracture or dislocation is noted. IMPRESSION: No acute abnormality seen. Electronically Signed   By: Marijo Conception M.D.   On: 08/06/2022 15:15   DG Ankle Left Port  Result Date: 08/06/2022 CLINICAL DATA:  Left ankle pain after fall 2 days ago. EXAM: PORTABLE LEFT ANKLE - 2 VIEW COMPARISON:  None Available. FINDINGS: There is no evidence of fracture, dislocation, or joint effusion. There is no evidence of arthropathy or other focal bone abnormality. Soft tissues are unremarkable. IMPRESSION: Negative. Electronically Signed   By: Marijo Conception M.D.   On: 08/06/2022 14:00    Scheduled Meds:  enoxaparin (LOVENOX) injection  40 mg  Subcutaneous Q24H   gabapentin  200 mg Oral BID   insulin aspart  0-15 Units Subcutaneous TID WC   insulin aspart  0-5 Units Subcutaneous QHS   lisinopril  40 mg Oral Daily   simvastatin  20 mg Oral Daily   Continuous Infusions:  sodium chloride 75 mL/hr at 08/08/22 0300     LOS: 1 day    Time spent: 50 mins    Duard Brady, MD [facility]   If 7PM-7AM, please contact night-coverage

## 2022-08-09 DIAGNOSIS — S82142A Displaced bicondylar fracture of left tibia, initial encounter for closed fracture: Secondary | ICD-10-CM | POA: Diagnosis not present

## 2022-08-09 LAB — GLUCOSE, CAPILLARY
Glucose-Capillary: 119 mg/dL — ABNORMAL HIGH (ref 70–99)
Glucose-Capillary: 111 mg/dL — ABNORMAL HIGH (ref 70–99)
Glucose-Capillary: 192 mg/dL — ABNORMAL HIGH (ref 70–99)
Glucose-Capillary: 134 mg/dL — ABNORMAL HIGH (ref 70–99)

## 2022-08-09 MED ORDER — SALINE SPRAY 0.65 % NA SOLN
1.0000 | NASAL | Status: DC | PRN
  Administered 2022-08-09: 1 via NASAL
  Filled 2022-08-09: qty 44

## 2022-08-09 MED ORDER — AMLODIPINE BESYLATE 5 MG PO TABS
5.0000 mg | ORAL_TABLET | Freq: Every day | ORAL | Status: DC
  Administered 2022-08-09 – 2022-08-10 (×2): 5 mg via ORAL
  Filled 2022-08-09 (×2): qty 1

## 2022-08-09 MED ORDER — TEMAZEPAM 15 MG PO CAPS
30.0000 mg | ORAL_CAPSULE | Freq: Once | ORAL | Status: AC | PRN
  Administered 2022-08-09: 30 mg via ORAL
  Filled 2022-08-09: qty 2

## 2022-08-09 NOTE — Plan of Care (Signed)
  Problem: Education: Goal: Knowledge of General Education information will improve Description: Including pain rating scale, medication(s)/side effects and non-pharmacologic comfort measures Outcome: Progressing   Problem: Health Behavior/Discharge Planning: Goal: Ability to manage health-related needs will improve Outcome: Progressing   Problem: Clinical Measurements: Goal: Ability to maintain clinical measurements within normal limits will improve Outcome: Progressing Goal: Will remain free from infection Outcome: Progressing Goal: Diagnostic test results will improve Outcome: Progressing Goal: Respiratory complications will improve Outcome: Progressing Goal: Cardiovascular complication will be avoided Outcome: Progressing   Problem: Coping: Goal: Level of anxiety will decrease Outcome: Progressing   Problem: Elimination: Goal: Will not experience complications related to bowel motility Outcome: Progressing Goal: Will not experience complications related to urinary retention Outcome: Completed/Met

## 2022-08-09 NOTE — TOC Progression Note (Addendum)
Transition of Care [facility]) - Progression Note   Patient Details  Name: Kenneth Henson MRN: [id number] Date of Birth: 1940-06-28  Transition of Care [facility]) CM/SW [place], LCSW Phone Number: 08/09/2022, 12:52 PM  Clinical Narrative: CSW met with patient to review discharge plans. Patient is leaning towards [facility], but would like his daughter to visit the facility first. CSW left voicemail for patient's daughter, Colletta [place], and spoke with patient's daughter, Elmyra Ricks. Elmyra Ricks to coordinate with sister to set up a visit to [facility]. CSW confirmed bed with [facility] in admissions and she is aware the daughter will be reaching out to visit the facility.  Addendum: CSW left copy of bed offers and Medicare star ratings with patient for him to review with his daughter today.  Expected Discharge Plan: Skilled Nursing Facility Barriers to Discharge: SNF Pending bed offer  Expected Discharge Plan and Services In-house Referral: Clinical Social Work Post Acute Care Choice: [place] Living arrangements for the past 2 months: Apartment           DME Arranged: N/A DME Agency: NA  Social Determinants of Health (SDOH) Interventions SDOH Screenings   Food Insecurity: No Food Insecurity (08/07/2022)  Housing: Low Risk  (08/07/2022)  Transportation Needs: No Transportation Needs (08/07/2022)  Utilities: Not At Risk (08/07/2022)  Alcohol Screen: Low Risk  (06/20/2022)  Depression (PHQ2-9): Low Risk  (06/20/2022)  Financial Resource Strain: Low Risk  (06/20/2022)  Physical Activity: Inactive (06/20/2022)  Social Connections: Moderately Integrated (06/20/2022)  Stress: No Stress Concern Present (06/20/2022)  Tobacco Use: Medium Risk (08/07/2022)   Readmission Risk Interventions     No data to display

## 2022-08-09 NOTE — Progress Notes (Signed)
PROGRESS NOTE    Kenneth Henson  [id number] DOB: 1940-08-21 DOA: 08/06/2022  PCP: [place], Kenneth Henson   Brief Narrative:  This 82 years old male with PMH significant for legal blindness, hypertension, hyperlipidemia, diabetes mellitus, gout, peripheral neuropathy and gait instability with recurrent falls.  Patient fell off the bed and reports his left knee hit the bed and he fell hard.  Patient could not walk following fall and has severe pain in his ankle and tibia.  Imaging revealed left lateral plateuae fracture.  Orthopedics consulted, None of these injuries require surgical intervention.  Left toe and left knee fracture should heal on their own.  Right shoulder likely hurts from contusions from the fall.  Recommended ice and elevation of all areas.  Nonweightbearing RUE LLE. He reports pain is reasonably controlled.  PT/OT recommended SNF.  Assessment & Plan:   Principal Problem:   Fracture of left tibial plateau Active Problems:   Hyperlipidemia   Essential hypertension   Diabetes mellitus without complication (HCC)  Left lateral tibial plateau fracture: Nondisplaced fracture of proximal phalanx of left great toe: Patient presented s/p mechanical fall with imaging shows left lateral plateau fracture. Continue adequate pain control. Ortho consulted, states none of these injuries require surgical intervention. Left toe and left knee fracture should heal on their own. Advised Nonweightbearing left lower extremity. PT and OT recommended SNF. Outpatient orthopedic follow-up in 2 weeks.  Right shoulder bursitis: Right shoulder likely hurts from contusion from the fall.   Recommended ice and elevation. Weightbearing as tolerated RUE.  Essential hypertension: Continue Norvasc, lisinopril,  hold hydrochlorothiazide. Start amlodipine 5 mg daily for better blood pressure control  Hyperlipidemia: Continue  simvastatin  Type 2 diabetes: Continue regular insulin sliding  scale.  DVT prophylaxis: Lovenox Code Status:Full code Family Communication: No family at bed side. Disposition Plan:    Status is: Inpatient Remains inpatient appropriate because: Admitted s/p mechanical fall with left lateral plateau fracture,  orthopedics consulted,  none of these require surgical intervention.  PT and OT recommended SNF.   Consultants:  Orthopaedics  Procedures: None  Antimicrobials: None  Subjective: Patient was seen and examined at bedside.  Overnight events noted. Patient reports pain is reasonably controlled.  He does not require surgical intervention. PT and OT recommended SNF, awaiting placement.  Objective: Vitals:   08/08/22 0512 08/08/22 2132 08/09/22 0529 08/09/22 0900  BP: (!) 158/56 (!) 151/80 (!) 147/66 (!) 172/70  Pulse: 77 67 (!) 57 72  Resp: 18 17 18 20   Temp: 99 F (37.2 C) 98.5 F (36.9 C) 97.7 F (36.5 C) 98.4 F (36.9 C)  TempSrc: Oral Oral  Oral  SpO2: 95% 98% 96% 96%  Weight:      Height:        Intake/Output Summary (Last 24 hours) at 08/09/2022 1147 Last data filed at 08/09/2022 0830 Gross per 24 hour  Intake 2529.75 ml  Output 1175 ml  Net 1354.75 ml   Filed Weights   08/06/22 1339  Weight: 117.5 kg    Examination:  General exam: Appears comfortable, not in any acute distress. Respiratory system: Clear to auscultation. Respiratory effort normal. RR16 Cardiovascular system: S1 & S2 heard, regular rate and rhythm, no murmur. Gastrointestinal system: Abdomen is soft, non tender, non distended, BS+ Central nervous system: Alert and oriented X 3. No focal neurological deficits. Extremities: Right shoulder tender with decreased ROM, left ankle diffusely tender, left lateral malleolus tenderness noted Skin: No rashes, lesions or ulcers Psychiatry: Judgement  and insight appear normal. Mood & affect appropriate.     Data Reviewed: I have personally reviewed following labs and imaging studies  CBC: Recent Labs  Lab  08/06/22 2204 08/08/22 0337  WBC 14.2* 9.8  NEUTROABS 10.3* 6.4  HGB 14.0 12.8*  HCT 40.8 39.9  MCV 88.1 92.8  PLT 230 [id number]   Basic Metabolic Panel: Recent Labs  Lab 08/06/22 2204 08/08/22 0337  NA 135 136  K 4.1 4.2  CL 101 104  CO2 25 24  GLUCOSE 194* 118*  BUN 9 9  CREATININE 0.83 0.74  CALCIUM 9.1 8.4*   GFR: Estimated Creatinine Clearance: 90 mL/min (by C-G formula based on SCr of 0.74 mg/dL). Liver Function Tests: Recent Labs  Lab 08/06/22 2204  AST 17  ALT 12  ALKPHOS 222*  BILITOT 0.8  PROT 6.8  ALBUMIN 3.8   No results for input(s): "LIPASE", "AMYLASE" in the last 168 hours. No results for input(s): "AMMONIA" in the last 168 hours. Coagulation Profile: Recent Labs  Lab 08/08/22 0337  INR 1.1   Cardiac Enzymes: No results for input(s): "CKTOTAL", "CKMB", "CKMBINDEX", "TROPONINI" in the last 168 hours. BNP (last 3 results) No results for input(s): "PROBNP" in the last 8760 hours. HbA1C: Recent Labs    08/07/22 0457  HGBA1C 7.2*   CBG: Recent Labs  Lab 08/08/22 1133 08/08/22 1636 08/08/22 2159 08/09/22 0740 08/09/22 1136  GLUCAP 113* 109* 100* 119* 111*   Lipid Profile: No results for input(s): "CHOL", "HDL", "LDLCALC", "TRIG", "CHOLHDL", "LDLDIRECT" in the last 72 hours. Thyroid Function Tests: No results for input(s): "TSH", "T4TOTAL", "FREET4", "T3FREE", "THYROIDAB" in the last 72 hours. Anemia Panel: No results for input(s): "VITAMINB12", "FOLATE", "FERRITIN", "TIBC", "IRON", "RETICCTPCT" in the last 72 hours. Sepsis Labs: No results for input(s): "PROCALCITON", "LATICACIDVEN" in the last 168 hours.  No results found for this or any previous visit (from the past 240 hour(s)).   Radiology Studies: ECHOCARDIOGRAM COMPLETE  Result Date: 08/08/2022    ECHOCARDIOGRAM REPORT   Patient Name:   Kenneth Henson Date of Exam: 08/08/2022 Medical Rec #:  [id number]        Height:       69.0 in Accession #:    [id number]       Weight:        259.0 lb Date of Birth:  05/12/1941        BSA:          2.306 m Patient Age:    73 years         BP:           136/55 mmHg Patient Gender: M                HR:           70 bpm. Exam Location:  Inpatient Procedure: 2D Echo, Cardiac Doppler and Color Doppler Indications:    R94.31 Abnormal EKG  History:        Patient has no prior history of Echocardiogram examinations.                 Arrythmias:PAC, Signs/Symptoms:Chest Pain; Risk                 Factors:Dyslipidemia, Hypertension, Diabetes and Former Smoker.  Sonographer:    Rolla Etienne Referring Phys: [place]  1. Left ventricular ejection fraction, by estimation, is 55 to 60%. The left ventricle has normal function. The left ventricle has no regional wall motion abnormalities.  There is mild concentric left ventricular hypertrophy. Left ventricular diastolic parameters are consistent with Grade II diastolic dysfunction (pseudonormalization). Elevated left atrial pressure.  2. Right ventricular systolic function is normal. The right ventricular size is normal. There is normal pulmonary artery systolic pressure. The estimated right ventricular systolic pressure is [id number] mmHg.  3. The mitral valve is normal in structure. Trivial mitral valve regurgitation.  4. The aortic valve was not well visualized. There is mild calcification of the aortic valve. There is mild thickening of the aortic valve. Aortic valve regurgitation is not visualized. Aortic valve sclerosis/calcification is present, without any evidence of aortic stenosis.  5. The inferior vena cava is normal in size with greater than 50% respiratory variability, suggesting right atrial pressure of 3 mmHg. FINDINGS  Left Ventricle: Left ventricular ejection fraction, by estimation, is 55 to 60%. The left ventricle has normal function. The left ventricle has no regional wall motion abnormalities. The left ventricular internal cavity size was normal in size. There is  mild concentric left  ventricular hypertrophy. Abnormal (paradoxical) septal motion, consistent with left bundle branch block. Left ventricular diastolic parameters are consistent with Grade II diastolic dysfunction (pseudonormalization). Elevated left atrial pressure. Right Ventricle: The right ventricular size is normal. No increase in right ventricular wall thickness. Right ventricular systolic function is normal. There is normal pulmonary artery systolic pressure. The tricuspid regurgitant velocity is 2.57 m/s, and  with an assumed right atrial pressure of 3 mmHg, the estimated right ventricular systolic pressure is [id number] mmHg. Left Atrium: Left atrial size was normal in size. Right Atrium: Right atrial size was normal in size. Pericardium: There is no evidence of pericardial effusion. Mitral Valve: The mitral valve is normal in structure. Trivial mitral valve regurgitation. Tricuspid Valve: The tricuspid valve is normal in structure. Tricuspid valve regurgitation is mild. Aortic Valve: The aortic valve was not well visualized. There is mild calcification of the aortic valve. There is mild thickening of the aortic valve. Aortic valve regurgitation is not visualized. Aortic valve sclerosis/calcification is present, without any evidence of aortic stenosis. Pulmonic Valve: The pulmonic valve was not well visualized. Aorta: The aortic root is normal in size and structure. Venous: The inferior vena cava is normal in size with greater than 50% respiratory variability, suggesting right atrial pressure of 3 mmHg. IAS/Shunts: No atrial level shunt detected by color flow Doppler.  LEFT VENTRICLE PLAX 2D LVIDd:         3.80 cm     Diastology LVIDs:         2.60 cm     LV e' medial:    8.70 cm/s LV PW:         1.30 cm     LV E/e' medial:  11.5 LV IVS:        1.20 cm     LV e' lateral:   7.07 cm/s LVOT diam:     2.00 cm     LV E/e' lateral: 14.1 LVOT Area:     3.14 cm  LV Volumes (MOD) LV vol d, MOD A2C: 82.2 ml LV vol d, MOD A4C: 84.0 ml LV vol  s, MOD A2C: 35.7 ml LV vol s, MOD A4C: 36.9 ml LV SV MOD A2C:     46.5 ml LV SV MOD A4C:     84.0 ml LV SV MOD BP:      49.1 ml RIGHT VENTRICLE RV Basal diam:  3.30 cm RV Mid diam:    2.50 cm RV S prime:  18.80 cm/s TAPSE (M-mode): 3.0 cm LEFT ATRIUM             Index        RIGHT ATRIUM           Index LA diam:        3.00 cm 1.30 cm/m   RA Area:     16.50 cm LA Vol (A2C):   64.2 ml 27.84 ml/m  RA Volume:   39.80 ml  17.26 ml/m LA Vol (A4C):   48.0 ml 20.82 ml/m LA Biplane Vol: 58.2 ml 25.24 ml/m   AORTA Ao Root diam: 3.60 cm MITRAL VALVE                TRICUSPID VALVE MV Area (PHT): 3.31 cm     TR Peak grad:   26.4 mmHg MV Decel Time: 229 msec     TR Vmax:        257.00 cm/s MV E velocity: 100.00 cm/s MV A velocity: 109.00 cm/s  SHUNTS MV E/A ratio:  0.92         Systemic Diam: 2.00 cm Mihai Croitoru Henson Electronically signed by Sanda Klein Henson Signature Date/Time: 08/08/2022/11:52:33 AM    Final     Scheduled Meds:  amLODipine  5 mg Oral Daily   enoxaparin (LOVENOX) injection  40 mg Subcutaneous Q24H   gabapentin  200 mg Oral BID   insulin aspart  0-15 Units Subcutaneous TID WC   insulin aspart  0-5 Units Subcutaneous QHS   lisinopril  40 mg Oral Daily   simvastatin  20 mg Oral Daily   Continuous Infusions:  sodium chloride 75 mL/hr at 08/09/22 0500     LOS: 2 days    Time spent: 35 mins    Duard Brady, Henson [facility]   If 7PM-7AM, please contact night-coverage

## 2022-08-10 ENCOUNTER — Inpatient Hospital Stay (HOSPITAL_COMMUNITY): Payer: Medicare Other

## 2022-08-10 DIAGNOSIS — S82142A Displaced bicondylar fracture of left tibia, initial encounter for closed fracture: Secondary | ICD-10-CM | POA: Diagnosis not present

## 2022-08-10 LAB — GLUCOSE, CAPILLARY
Glucose-Capillary: 118 mg/dL — ABNORMAL HIGH (ref 70–99)
Glucose-Capillary: 141 mg/dL — ABNORMAL HIGH (ref 70–99)
Glucose-Capillary: 127 mg/dL — ABNORMAL HIGH (ref 70–99)
Glucose-Capillary: 129 mg/dL — ABNORMAL HIGH (ref 70–99)

## 2022-08-10 MED ORDER — FUROSEMIDE 10 MG/ML IJ SOLN
40.0000 mg | Freq: Once | INTRAMUSCULAR | Status: AC
  Administered 2022-08-10: 40 mg via INTRAVENOUS
  Filled 2022-08-10: qty 4

## 2022-08-10 MED ORDER — AMLODIPINE BESYLATE 10 MG PO TABS
10.0000 mg | ORAL_TABLET | Freq: Every day | ORAL | Status: DC
  Administered 2022-08-11: 10 mg via ORAL
  Filled 2022-08-10: qty 1

## 2022-08-10 NOTE — Progress Notes (Addendum)
PROGRESS NOTE    Kenneth Henson  [id number] DOB: 1940-09-22 DOA: 08/06/2022  PCP: [place], [name], MD   Brief Narrative:  This 82 years old male with PMH significant for legal blindness, hypertension, hyperlipidemia, diabetes mellitus, gout, peripheral neuropathy and gait instability with recurrent falls.  Patient fell off the bed and reports his left knee hit the bed and he fell hard.  Patient could not walk following fall and has severe pain in his ankle and tibia.  Imaging revealed left lateral plateuae fracture.  Orthopedics consulted, None of these injuries require surgical intervention.  Left toe and left knee fracture should heal on their own.  Right shoulder likely hurts from contusions from the fall.  Recommended ice and elevation of all areas.  Nonweightbearing RUE LLE. He reports pain is reasonably controlled.  PT/OT recommended SNF.  Assessment & Plan:   Principal Problem:   Fracture of left tibial plateau Active Problems:   Hyperlipidemia   Essential hypertension   Diabetes mellitus without complication (HCC)  Left lateral tibial plateau fracture: Nondisplaced fracture of proximal phalanx of left great toe: Patient presented s/p mechanical fall with imaging shows left lateral plateau fracture. Continue adequate pain control. Ortho consulted, states none of these injuries require surgical intervention. Left toe and left knee fracture should heal on their own. Advised Nonweightbearing left lower extremity. PT and OT recommended SNF. Outpatient orthopedic follow-up in 2 weeks.  Right shoulder bursitis: Right shoulder likely hurts from contusion from the fall.   Recommended ice and elevation. Weightbearing as tolerated RUE.  Essential hypertension: Continue lisinopril 40 mg daily. Increase amlodipine to 10 mg for better blood pressure control  Hyperlipidemia: Continue  simvastatin  Type 2 diabetes: Continue regular insulin sliding scale.  Acute hypoxic  respiratory failure: Patient became short of breath last night requiring 2 L of supplemental oxygen. This could be due to fluid overload, been receiving IV fluids. DC fluids. Obtain chest x-ray.  Continue supplemental oxygen,  wean as tolerated  DVT prophylaxis: Lovenox Code Status:Full code Family Communication: No family at bed side. Disposition Plan:    Status is: Inpatient Remains inpatient appropriate because: Admitted s/p mechanical fall with left lateral plateau fracture,  orthopedics consulted,  none of these require surgical intervention.  PT and OT recommended SNF.   Consultants:  Orthopaedics  Procedures: None  Antimicrobials: None  Subjective: Patient was seen and examined at bedside.  Overnight events noted. Patient reports pain is reasonably controlled.  He does not require surgical intervention. Last night he became short of breath requiring 2 L of supplemental oxygen. PT and OT recommended SNF, awaiting placement.  Objective: Vitals:   08/10/22 0050 08/10/22 0408 08/10/22 0933 08/10/22 1207  BP:  (!) 112/90 (!) 147/65 (!) 167/84  Pulse:  68 69 66  Resp:  17    Temp:  98.5 F (36.9 C)    TempSrc:  Oral    SpO2: 97% 96% 95% 95%  Weight:      Height:        Intake/Output Summary (Last 24 hours) at 08/10/2022 1317 Last data filed at 08/10/2022 0951 Gross per 24 hour  Intake 2453.93 ml  Output 2600 ml  Net -146.07 ml   Filed Weights   08/06/22 1339  Weight: 117.5 kg    Examination:  General exam: Appears comfortable, not in any acute distress. Respiratory system: Clear to auscultation. Respiratory effort normal. RR16 Cardiovascular system: S1 & S2 heard, regular rate and rhythm, no murmur. Gastrointestinal system: Abdomen  is soft, non tender, non distended, BS+ Central nervous system: Alert and oriented X 3. No focal neurological deficits. Extremities: Right shoulder tender with decreased ROM, left ankle diffusely tender, left lateral malleolus  tenderness noted Skin: No rashes, lesions or ulcers Psychiatry: Judgement and insight appear normal. Mood & affect appropriate.     Data Reviewed: I have personally reviewed following labs and imaging studies  CBC: Recent Labs  Lab 08/06/22 2204 08/08/22 0337  WBC 14.2* 9.8  NEUTROABS 10.3* 6.4  HGB 14.0 12.8*  HCT 40.8 39.9  MCV 88.1 92.8  PLT 230 [id number]   Basic Metabolic Panel: Recent Labs  Lab 08/06/22 2204 08/08/22 0337  NA 135 136  K 4.1 4.2  CL 101 104  CO2 25 24  GLUCOSE 194* 118*  BUN 9 9  CREATININE 0.83 0.74  CALCIUM 9.1 8.4*   GFR: Estimated Creatinine Clearance: 90 mL/min (by C-G formula based on SCr of 0.74 mg/dL). Liver Function Tests: Recent Labs  Lab 08/06/22 2204  AST 17  ALT 12  ALKPHOS 222*  BILITOT 0.8  PROT 6.8  ALBUMIN 3.8   No results for input(s): "LIPASE", "AMYLASE" in the last 168 hours. No results for input(s): "AMMONIA" in the last 168 hours. Coagulation Profile: Recent Labs  Lab 08/08/22 0337  INR 1.1   Cardiac Enzymes: No results for input(s): "CKTOTAL", "CKMB", "CKMBINDEX", "TROPONINI" in the last 168 hours. BNP (last 3 results) No results for input(s): "PROBNP" in the last 8760 hours. HbA1C: No results for input(s): "HGBA1C" in the last 72 hours.  CBG: Recent Labs  Lab 08/09/22 1136 08/09/22 1543 08/09/22 2038 08/10/22 0734 08/10/22 1108  GLUCAP 111* 192* 134* 118* 141*   Lipid Profile: No results for input(s): "CHOL", "HDL", "LDLCALC", "TRIG", "CHOLHDL", "LDLDIRECT" in the last 72 hours. Thyroid Function Tests: No results for input(s): "TSH", "T4TOTAL", "FREET4", "T3FREE", "THYROIDAB" in the last 72 hours. Anemia Panel: No results for input(s): "VITAMINB12", "FOLATE", "FERRITIN", "TIBC", "IRON", "RETICCTPCT" in the last 72 hours. Sepsis Labs: No results for input(s): "PROCALCITON", "LATICACIDVEN" in the last 168 hours.  No results found for this or any previous visit (from the past 240 hour(s)).    Radiology Studies: No results found.  Scheduled Meds:  [START ON 08/11/2022] amLODipine  10 mg Oral Daily   enoxaparin (LOVENOX) injection  40 mg Subcutaneous Q24H   gabapentin  200 mg Oral BID   insulin aspart  0-15 Units Subcutaneous TID WC   insulin aspart  0-5 Units Subcutaneous QHS   lisinopril  40 mg Oral Daily   simvastatin  20 mg Oral Daily   Continuous Infusions:     LOS: 3 days    Time spent: 35 mins    Duard Brady, MD [facility]   If 7PM-7AM, please contact night-coverage

## 2022-08-10 NOTE — Plan of Care (Signed)

## 2022-08-10 NOTE — Progress Notes (Signed)
Physical Therapy Treatment Patient Details Name: Kenneth Henson MRN: [id number] DOB: [birth year] Today's Date: 08/10/2022   History of Present Illness This is a pleasant 82 year old male with past medical history of legal blindness, HTN, HLD, DM, gout, peripheral neuropathy, R reverse TSA, and instability with falls.  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 Moderate-large volume of fluid within the subacromial-subdeltoid bursal space suggesting bursitis    PT Comments    Pt remains motivated and cooperative however reports he is not feeling well today; dizzy, diaphoretic however VSS. PT and RN assisted pt up to chair; pt will benefit from continued PT post acute   Recommendations for follow up therapy are one component of a multi-disciplinary discharge planning process, led by the attending physician.  Recommendations may be updated based on patient status, additional functional criteria and insurance authorization.  Follow Up Recommendations  Can patient physically be transported by private vehicle: No    Assistance Recommended at Discharge Frequent or constant Supervision/Assistance  Patient can return home with the following Two people to help with walking and/or transfers;A lot of help with bathing/dressing/bathroom;Assist for transportation;Help with stairs or ramp for entrance   Equipment Recommendations  None recommended by PT    Recommendations for Other Services       Precautions / Restrictions  Precautions Precautions: Fall Precaution Comments: CAM boot on L Required Braces or Orthoses: Knee Immobilizer - Left;Other Brace Knee Immobilizer - Left: On at all times Other Brace: CAM boot Restrictions LLE Weight Bearing: Non weight bearing     Mobility  Bed Mobility Overal bed mobility: Needs Assistance Bed Mobility: Supine to Sit     Supine to sit: Mod assist, Max assist     General bed mobility comments: assist with LLE and to elevate trunk    Transfers Overall transfer level: Needs assistance Equipment used: Rolling walker (2 wheels) Transfers: Sit to/from Stand, Bed to chair/wheelchair/BSC Sit to Stand: Mod assist, +2 physical assistance, +2 safety/equipment, From elevated surface Stand pivot transfers: Mod assist, +2 physical assistance, +2 safety/equipment         General transfer comment: STS x3 with assist to rise, transition to RW and to maintain NWB, on 3rd trial pt able to pivot to recliner with mod assist of 2 to maneuver RW, maintain NWB    Ambulation/Gait                   Stairs             Wheelchair Mobility    Modified Rankin (Stroke Patients Only)       Balance Overall balance assessment: Needs assistance, History of Falls Sitting-balance support: Bilateral upper extremity supported, Feet supported Sitting balance-Leahy Scale: Fair     Standing balance support: Bilateral upper extremity supported, During functional activity, Reliant on assistive device for balance Standing balance-Leahy Scale: Poor                              Cognition Arousal/Alertness: Awake/alert Behavior During Therapy: WFL for tasks assessed/performed Overall Cognitive Status: Within Functional Limits for tasks  assessed                                 General Comments: AxO x 3 very pleasant and motivated retired Social research officer, 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  Moved to [place] to be closer to family (Son).         Exercises      General Comments        Pertinent Vitals/Pain Pain Assessment Pain Assessment: Faces Faces Pain Scale: Hurts little more Pain Location: L knee and R shoulder Pain Descriptors / Indicators: Grimacing Pain Intervention(s): Limited activity within patient's tolerance, Monitored during session, Premedicated before session, Repositioned    Home Living                          Prior Function            PT Goals (current goals can now be found in the care plan section) Acute Rehab PT Goals Patient Stated Goal: to go home PT Goal Formulation: With patient/family Time For Goal Achievement: 08/21/22 Potential to Achieve Goals: Good Progress towards PT goals: Progressing toward goals    Frequency    Min 2X/week      PT Plan Current plan remains appropriate    Co-evaluation              AM-PAC PT "6 Clicks" Mobility   Outcome Measure  Help needed turning from your back to your side while in a flat bed without using bedrails?: Total Help needed moving from lying on your back to sitting on the side of a flat bed without using bedrails?: Total Help needed moving to and from a bed to a chair (including a wheelchair)?: Total Help needed standing up from a chair using your arms (e.g., wheelchair or bedside chair)?: Total Help needed to walk in hospital room?: Total Help needed climbing 3-5 steps with a railing? : Total 6 Click Score: 6    End of Session Equipment Utilized During Treatment: Gait belt;Left knee immobilizer;Other (comment) (cam boot) Activity Tolerance: Patient tolerated treatment well;Patient limited by fatigue (diaphoretic but VSS) Patient left: in chair;with call bell/phone within reach;with chair alarm set;with nursing/sitter in room Nurse Communication: Mobility status PT Visit Diagnosis: Unsteadiness on feet (R26.81);Other abnormalities of gait and mobility (R26.89);History of falling (Z91.81)     Time: [id number] PT Time  Calculation (min) (ACUTE ONLY): 17 min  Charges:  $Therapeutic Activity: 8-22 mins                     Baxter Flattery, PT  Acute Rehab Dept [facility]) [phone]  08/10/2022    [facility] 08/10/2022, 12:32 PM

## 2022-08-10 NOTE — TOC Progression Note (Signed)
Transition of Care [facility]) - Progression Note   Patient Details  Name: Kenneth Henson MRN: [id number] Date of Birth: June 11, 1940  Transition of Care [facility]) CM/SW [place], LCSW Phone Number: 08/10/2022, 12:51 PM  Clinical Narrative: Patient and daughter, Colletta [place], have chosen [facility] after the daughter visited the facility. CSW notified [facility] that the bed will not be needed. Abigail Butts in admissions at [facility] confirmed the bed will be available tomorrow. CSW notified patient and hospitalist.    Expected Discharge Plan: Skilled Nursing Facility Barriers to Discharge: SNF Pending bed offer  Expected Discharge Plan and Services In-house Referral: Clinical Social Work Post Acute Care Choice: [facility] Living arrangements for the past 2 months: Apartment           DME Arranged: N/A DME Agency: NA  Social Determinants of Health (SDOH) Interventions SDOH Screenings   Food Insecurity: No Food Insecurity (08/07/2022)  Housing: Low Risk  (08/07/2022)  Transportation Needs: No Transportation Needs (08/07/2022)  Utilities: Not At Risk (08/07/2022)  Alcohol Screen: Low Risk  (06/20/2022)  Depression (PHQ2-9): Low Risk  (06/20/2022)  Financial Resource Strain: Low Risk  (06/20/2022)  Physical Activity: Inactive (06/20/2022)  Social Connections: Moderately Integrated (06/20/2022)  Stress: No Stress Concern Present (06/20/2022)  Tobacco Use: Medium Risk (08/07/2022)   Readmission Risk Interventions     No data to display

## 2022-08-10 NOTE — Progress Notes (Signed)
Pt c/o severe dizziness while in bed. Obtained VS, which are stable. He is also slightly diaphoretic, CBG 141. Dr. Duard Brady & charge RN, Deanna Artis notified.

## 2022-08-10 NOTE — Progress Notes (Signed)
Pt OOB &  into chair with assist x 2. Pt still diaphoretic, provided fan & pain meds.

## 2022-08-10 NOTE — Progress Notes (Signed)
Pt c/o of SOB and dizziness while in bed. Family at bedside concerned about they symptoms. Pt states has been going on earlier in the morning. Denies chest pain, headache, changes in hearing, N/V. Pt is aler and oriented x4, Respiration regular, no signs of increased work of breathing. Lung sounds diminished lower bases, normal heart sounds. Skin warm and dry. VS: Temp: 98.4 F, BP: 156/68, Pulse: 61, Resp: 18, SpO2: 94 RA. Pt was placed on 2L via [place]. Karlyn Agee, NP. Pt reassessed after 1 hour, pt states decrease in dizziness and denies SOB. Plan of care ongoing.

## 2022-08-11 DIAGNOSIS — E785 Hyperlipidemia, unspecified: Secondary | ICD-10-CM

## 2022-08-11 DIAGNOSIS — M25511 Pain in right shoulder: Secondary | ICD-10-CM | POA: Diagnosis not present

## 2022-08-11 DIAGNOSIS — M7122 Synovial cyst of popliteal space [Baker], left knee: Secondary | ICD-10-CM

## 2022-08-11 DIAGNOSIS — S82142A Displaced bicondylar fracture of left tibia, initial encounter for closed fracture: Secondary | ICD-10-CM | POA: Diagnosis not present

## 2022-08-11 DIAGNOSIS — I10 Essential (primary) hypertension: Secondary | ICD-10-CM | POA: Diagnosis not present

## 2022-08-11 LAB — GLUCOSE, CAPILLARY: Glucose-Capillary: 114 mg/dL — ABNORMAL HIGH (ref 70–99)

## 2022-08-11 MED ORDER — ONDANSETRON HCL 4 MG PO TABS: 4.0000 mg | ORAL_TABLET | Freq: Four times a day (QID) | ORAL | 0 refills | Status: DC | PRN

## 2022-08-11 MED ORDER — AMLODIPINE BESYLATE 10 MG PO TABS: 10.0000 mg | ORAL_TABLET | Freq: Every day | ORAL | Status: DC

## 2022-08-11 NOTE — Discharge Summary (Signed)
Physician Discharge Summary   Patient: Kenneth Henson MRN: [id number] DOB: 1941/02/25  Admit date:     08/06/2022  Discharge date: 08/11/22  Discharge Physician: Lorella Nimrod   PCP: de [place], [name], MD   Recommendations at discharge:  Please obtain CBC and BMP in 1 week Follow-up with orthopedic surgery in 2 weeks Follow-up with primary care provider  Discharge Diagnoses: Principal Problem:   Fracture of left tibial plateau Active Problems:   Hyperlipidemia   Essential hypertension   Diabetes mellitus without complication ([place])   Acute pain of right shoulder   Synovial cyst of left popliteal space   Hospital Course: This 82 years old male with PMH significant for legal blindness, hypertension, hyperlipidemia, diabetes mellitus, gout, peripheral neuropathy and gait instability with recurrent falls. Patient fell off the bed and reports his left knee hit the bed and he fell hard. Patient could not walk following fall and has severe pain in his ankle and tibia. Imaging revealed left lateral plateuae fracture. Orthopedics consulted, None of these injuries require surgical intervention. Left toe and left knee fracture should heal on their own. Right shoulder likely hurts from contusions from the fall. Recommended ice and elevation of all areas. Nonweightbearing RUE LLE. He reports pain is reasonably controlled. PT/OT recommended SNF.   She will be weightbearing as tolerated on right upper extremity.  She had right shoulder bursitis most likely secondary to injury concern contusion from the fall.  Patient is being discharged to rehab for further management.  He will continue on current medications and was given pain medications to be used as needed.  Patient need to follow-up in 2 weeks with orthopedic surgery for further recommendations. Also recommended to follow-up with primary care provider.   Pain control - [employer] Controlled Substance Reporting System database was  reviewed. and patient was instructed, not to drive, operate heavy machinery, perform activities at heights, swimming or participation in water activities or provide baby-sitting services while on Pain, Sleep and Anxiety Medications; until their outpatient Physician has advised to do so again. Also recommended to not to take more than prescribed Pain, Sleep and Anxiety Medications.  Consultants: Orthopedic surgery Procedures performed: None Disposition: Skilled nursing facility Diet recommendation:  Cardiac and Carb modified diet DISCHARGE MEDICATION: Allergies as of 08/11/2022       Reactions   Doxycycline Nausea And Vomiting   Oxycodone-acetaminophen Itching        Medication List     STOP taking these medications    hydrochlorothiazide 25 MG tablet Commonly known as: HYDRODIURIL   oxyCODONE-acetaminophen 5-325 MG tablet Commonly known as: PERCOCET/ROXICET   temazepam 30 MG capsule Commonly known as: RESTORIL   traMADol 50 MG tablet Commonly known as: ULTRAM       TAKE these medications    acetaminophen 500 MG tablet Commonly known as: TYLENOL Take 1,000 mg by mouth as needed for moderate pain.   allopurinol 300 MG tablet Commonly known as: ZYLOPRIM Take 300 mg by mouth 2 (two) times daily as needed (gout).   amLODipine 10 MG tablet Commonly known as: NORVASC Take 1 tablet (10 mg total) by mouth daily.   diclofenac Sodium 1 % Gel Commonly known as: Voltaren Apply 2 g topically 4 (four) times daily. What changed:  how much to take when to take this reasons to take this   gabapentin 300 MG capsule Commonly known as: NEURONTIN Take 1 tab before bed x 2 weeks.  May increase to 1 tab at dinner for  2 weeks and then may increase to am, dinner and before bed thereafter for pain. What changed:  how much to take how to take this when to take this reasons to take this additional instructions   HYDROcodone-acetaminophen 5-325 MG tablet Commonly known as:  NORCO/VICODIN Take 1 tablet by mouth every 4 (four) hours as needed. What changed: Another medication with the same name was added. Make sure you understand how and when to take each.   HYDROcodone-acetaminophen 5-325 MG tablet Commonly known as: NORCO/VICODIN Take 1-2 tablets by mouth every 6 (six) hours as needed for moderate pain or severe pain. What changed: You were already taking a medication with the same name, and this prescription was added. Make sure you understand how and when to take each.   ibuprofen 200 MG tablet Commonly known as: ADVIL Take 400 mg by mouth every 4 (four) hours as needed for moderate pain. What changed: Another medication with the same name was removed. Continue taking this medication, and follow the directions you see here.   Linzess 145 MCG Caps capsule Generic drug: linaclotide Take 145 mcg by mouth as needed (constipation).   lisinopril 40 MG tablet Commonly known as: ZESTRIL Take 1 tablet (40 mg total) by mouth daily.   metFORMIN 850 MG tablet Commonly known as: GLUCOPHAGE Take 1 tablet (850 mg total) by mouth daily.   mirtazapine 15 MG tablet Commonly known as: REMERON Take 15 mg by mouth at bedtime.   ondansetron 4 MG tablet Commonly known as: ZOFRAN Take 1 tablet (4 mg total) by mouth every 6 (six) hours as needed for nausea.   PRESERVISION AREDS PO Take 1 tablet by mouth in the morning and at bedtime.   sildenafil 100 MG tablet Commonly known as: Viagra Take 1 tablet (100 mg total) by mouth as needed for erectile dysfunction.   simvastatin 20 MG tablet Commonly known as: ZOCOR Take 1 tablet (20 mg total) by mouth daily. What changed: additional instructions        Contact information for follow-up providers     [place], [name], MD. Schedule an appointment as soon as possible for a visit in 2 days.   Specialty: Family Medicine Contact information: [place] [address] [phone]          Renette Butters, MD. Schedule an appointment as soon as possible for a visit.   Specialty: Orthopedic Surgery Why: 1-2 weeks Contact information: [address] [place] [address] [phone]              Contact information for after-discharge care     [facility] .   Service: Skilled Nursing Contact information: [place] [phone]                    Discharge Exam: Danley Danker Weights   08/06/22 1339  Weight: 117.5 kg   General.  Obese Gentleman, In no acute distress. Pulmonary.  Lungs clear bilaterally, normal respiratory effort. CV.  Regular rate and rhythm, no JVD, rub or murmur. Abdomen.  Soft, nontender, nondistended, BS positive. CNS.  Alert and oriented .  No focal neurologic deficit. Extremities.  No edema, no cyanosis, pulses intact and symmetrical.  Left foot with boot Psychiatry.  Judgment and insight appears normal.   Condition at discharge: stable  The results of significant diagnostics from this hospitalization (including imaging, microbiology, ancillary and laboratory)  are listed below for reference.   Imaging Studies: DG CHEST PORT 1 VIEW  Result Date: 08/10/2022 CLINICAL DATA:  Shortness of breath EXAM: PORTABLE CHEST 1 VIEW COMPARISON:  Chest x-ray 09/08/2010 FINDINGS: Heart is mildly enlarged. There is no focal lung infiltrate, pleural effusion or pneumothorax. No acute fractures are seen. Bilateral shoulder arthroplasties are present. IMPRESSION: Mild cardiomegaly. No acute pulmonary process. Electronically Signed   By: Ronney Asters M.D.   On: 08/10/2022 15:07   ECHOCARDIOGRAM COMPLETE  Result Date: 08/08/2022    ECHOCARDIOGRAM REPORT   Patient Name:   YONEO MAGPANTAY Date of Exam: 08/08/2022 Medical Rec #:  [id number]        Height:       69.0 in Accession #:    [id number]       Weight:       259.0 lb Date of Birth:  12/23/1940         BSA:          2.306 m Patient Age:    82 years         BP:           136/55 mmHg Patient Gender: M                HR:           70 bpm. Exam Location:  Inpatient Procedure: 2D Echo, Cardiac Doppler and Color Doppler Indications:    R94.31 Abnormal EKG  History:        Patient has no prior history of Echocardiogram examinations.                 Arrythmias:PAC, Signs/Symptoms:Chest Pain; Risk                 Factors:Dyslipidemia, Hypertension, Diabetes and Former Smoker.  Sonographer:    Rolla Etienne Referring Phys: [place]  1. Left ventricular ejection fraction, by estimation, is 55 to 60%. The left ventricle has normal function. The left ventricle has no regional wall motion abnormalities. There is mild concentric left ventricular hypertrophy. Left ventricular diastolic parameters are consistent with Grade II diastolic dysfunction (pseudonormalization). Elevated left atrial pressure.  2. Right ventricular systolic function is normal. The right ventricular size is normal. There is normal pulmonary artery systolic pressure. The estimated right ventricular systolic pressure is [id number] mmHg.  3. The mitral valve is normal in structure. Trivial mitral valve regurgitation.  4. The aortic valve was not well visualized. There is mild calcification of the aortic valve. There is mild thickening of the aortic valve. Aortic valve regurgitation is not visualized. Aortic valve sclerosis/calcification is present, without any evidence of aortic stenosis.  5. The inferior vena cava is normal in size with greater than 50% respiratory variability, suggesting right atrial pressure of 3 mmHg. FINDINGS  Left Ventricle: Left ventricular ejection fraction, by estimation, is 55 to 60%. The left ventricle has normal function. The left ventricle has no regional wall motion abnormalities. The left ventricular internal cavity size was normal in size. There is  mild concentric left ventricular hypertrophy. Abnormal  (paradoxical) septal motion, consistent with left bundle branch block. Left ventricular diastolic parameters are consistent with Grade II diastolic dysfunction (pseudonormalization). Elevated left atrial pressure. Right Ventricle: The right ventricular size is normal. No increase in right ventricular wall thickness. Right ventricular systolic function is normal. There is normal pulmonary artery systolic pressure. The tricuspid regurgitant velocity is 2.57 m/s, and  with an assumed right atrial pressure of 3  mmHg, the estimated right ventricular systolic pressure is [id number] mmHg. Left Atrium: Left atrial size was normal in size. Right Atrium: Right atrial size was normal in size. Pericardium: There is no evidence of pericardial effusion. Mitral Valve: The mitral valve is normal in structure. Trivial mitral valve regurgitation. Tricuspid Valve: The tricuspid valve is normal in structure. Tricuspid valve regurgitation is mild. Aortic Valve: The aortic valve was not well visualized. There is mild calcification of the aortic valve. There is mild thickening of the aortic valve. Aortic valve regurgitation is not visualized. Aortic valve sclerosis/calcification is present, without any evidence of aortic stenosis. Pulmonic Valve: The pulmonic valve was not well visualized. Aorta: The aortic root is normal in size and structure. Venous: The inferior vena cava is normal in size with greater than 50% respiratory variability, suggesting right atrial pressure of 3 mmHg. IAS/Shunts: No atrial level shunt detected by color flow Doppler.  LEFT VENTRICLE PLAX 2D LVIDd:         3.80 cm     Diastology LVIDs:         2.60 cm     LV e' medial:    8.70 cm/s LV PW:         1.30 cm     LV E/e' medial:  11.5 LV IVS:        1.20 cm     LV e' lateral:   7.07 cm/s LVOT diam:     2.00 cm     LV E/e' lateral: 14.1 LVOT Area:     3.14 cm  LV Volumes (MOD) LV vol d, MOD A2C: 82.2 ml LV vol d, MOD A4C: 84.0 ml LV vol s, MOD A2C: 35.7 ml LV vol s, MOD  A4C: 36.9 ml LV SV MOD A2C:     46.5 ml LV SV MOD A4C:     84.0 ml LV SV MOD BP:      49.1 ml RIGHT VENTRICLE RV Basal diam:  3.30 cm RV Mid diam:    2.50 cm RV S prime:     18.80 cm/s TAPSE (M-mode): 3.0 cm LEFT ATRIUM             Index        RIGHT ATRIUM           Index LA diam:        3.00 cm 1.30 cm/m   RA Area:     16.50 cm LA Vol (A2C):   64.2 ml 27.84 ml/m  RA Volume:   39.80 ml  17.26 ml/m LA Vol (A4C):   48.0 ml 20.82 ml/m LA Biplane Vol: 58.2 ml 25.24 ml/m   AORTA Ao Root diam: 3.60 cm MITRAL VALVE                TRICUSPID VALVE MV Area (PHT): 3.31 cm     TR Peak grad:   26.4 mmHg MV Decel Time: 229 msec     TR Vmax:        257.00 cm/s MV E velocity: 100.00 cm/s MV A velocity: 109.00 cm/s  SHUNTS MV E/A ratio:  0.92         Systemic Diam: 2.00 cm Dani Gobble Croitoru MD Electronically signed by Sanda Klein MD Signature Date/Time: 08/08/2022/11:52:33 AM    Final    CT Ankle Left Wo Contrast  Result Date: 08/06/2022 CLINICAL DATA:  Left ankle trauma. EXAM: CT OF THE LEFT ANKLE WITHOUT CONTRAST TECHNIQUE: Multidetector CT imaging of the left ankle was performed according to  the standard protocol. Multiplanar CT image reconstructions were also generated. RADIATION DOSE REDUCTION: This exam was performed according to the departmental dose-optimization program which includes automated exposure control, adjustment of the mA and/or kV according to patient size and/or use of iterative reconstruction technique. COMPARISON:  Left ankle series earlier today. FINDINGS: Bones/Joint/Cartilage There is osteopenia and a very small nondisplaced chip fracture of the anterior surface of the lateral malleolus on 3:72 and 7:32. There is no further evidence of fractures. No primary pathologic bone lesion is seen. There is mild-to-moderate joint space loss at the ankle and trace spurring at the joint margins but no erosive arthropathy. There are mild features of nonerosive midfoot arthrosis and small noninflammatory  dorsal and plantar calcaneal enthesopathic spurs with small dystrophic calcification in the proximal plantar fascia but no fascial thickening. Narrowing and trace spurring is seen of the subtalar and calcaneocuboid joints and there is a small accessory os peroneum alongside the inferolateral aspect of the cuboid. Also noted is a bipartite medial hallux sesamoid bone. Ligaments Suboptimally assessed by CT. Muscles and Tendons There are mild atrophic changes in the intrinsic musculature of the distal foreleg and plantar foot. No muscular hematoma or mass is seen. The major tendons are not well studied with CT but appear grossly intact as far as seen. Soft tissues There is mild-to-moderate generalized edema at the ankle extending from the anterior distal foreleg. No underlying hematoma is seen. No focal fluid collections. IMPRESSION: 1. Very small nondisplaced chip fracture of the anterior surface of the lateral malleolus. 2. Osteopenia and degenerative changes without further evidence of acute injury. 3. Generalized soft tissue swelling. 4. Mild atrophic changes in the intrinsic musculature. Electronically Signed   By: Telford Nab M.D.   On: 08/06/2022 21:48   CT Knee Left Wo Contrast  Result Date: 08/06/2022 CLINICAL DATA:  Suprapatellar bursal effusion on left knee series today, fall injury with suspected occult fracture. EXAM: CT OF THE LEFT KNEE WITHOUT CONTRAST TECHNIQUE: Multidetector CT imaging of the left knee was performed according to the standard protocol. Multiplanar CT image reconstructions were also generated. RADIATION DOSE REDUCTION: This exam was performed according to the departmental dose-optimization program which includes automated exposure control, adjustment of the mA and/or kV according to patient size and/or use of iterative reconstruction technique. COMPARISON:  Left knee series earlier today. Left tibia fibula series earlier today. No prior cross-sectional imaging. FINDINGS:  Bones/Joint/Cartilage There is osteopenia.  There is no primary pathologic bone lesion. There is an acute slightly depressed fracture of the posterior aspect of the lateral tibial plateau, with an underlying linear longitudinal nondisplaced fracture line extending from the articular surface into metaphysis best seen on sagittal series 7 images thirty and 31, series 3 axial 81. There is no further evidence for fractures. The patella is intact. There is a small suprapatellar bursal effusion. The fluid measures 4-14 Hounsfield units below the expected density of hemarthrosis. No fat fluid level is seen. Two osteochondral loose bodies measuring 1.7 cm and 0.8 cm are noted just below the level of the joint line posterior to the lateral plateau and there is a minimally small popliteal cyst also seen. There is patellofemoral degenerative arthrosis with mild joint space loss and marginal osteophytes. Femorotibial degenerative arthrosis is seen with near bone-on-bone medial femorotibial joint space loss, moderate lateral compartment narrowing and mild marginal osteophytosis. 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 Ligaments Suboptimally assessed by CT. Muscles and Tendons No acute abnormality. The extensor  mechanism is intact. Other area tendons not as well seen but probably intact. There is mild partial fatty atrophy in the distal thigh and proximal foreleg musculature. No muscular hematoma is evident. Soft tissues There are minimal scattered calcific plaques in the superficial femoral, popliteal and popliteal trifurcation arteries. Mild stranding edema is seen in the subcutaneous plane anteriorly. There is no subcutaneous hematoma. IMPRESSION: 1. Acute slightly depressed fracture of the posterior aspect of the lateral tibial plateau, with underlying nondisplaced longitudinal fracture line extending from the articular surface into the metaphysis. 2.  Osteopenia and degenerative change. 3. Small suprapatellar bursal effusion. The density of the fluid is below that expected for hemarthrosis. There is no fat fluid level. 4. Two osteochondral loose bodies posterior to the lateral plateau just below the level of the joint line. 5. Tiny popliteal cyst. Electronically Signed   By: Telford Nab M.D.   On: 08/06/2022 21:30   CT PELVIS WO CONTRAST  Result Date: 08/06/2022 CLINICAL DATA:  Hip trauma.  Concern for fracture. EXAM: CT PELVIS WITHOUT CONTRAST TECHNIQUE: Multidetector CT imaging of the pelvis was performed following the standard protocol without intravenous contrast. RADIATION DOSE REDUCTION: This exam was performed according to the departmental dose-optimization program which includes automated exposure control, adjustment of the mA and/or kV according to patient size and/or use of iterative reconstruction technique. COMPARISON:  Earlier radiograph dated 08/06/2022. FINDINGS: Urinary Tract: Partially visualized 3 mm stone in the inferior pole of the left kidney. The visualized ureters and urinary bladder appear unremarkable. Bowel: There is sigmoid diverticulosis and scattered colonic diverticula without active inflammatory changes. The appendix is normal. Vascular/Lymphatic: Moderate aortoiliac atherosclerotic disease. The visualized IVC is grossly unremarkable on this noncontrast CT. No pelvic adenopathy. Reproductive: The prostate and seminal vesicles are grossly unremarkable. Other:  None Musculoskeletal: Chronic changes of the sacrum with cortical thickening and coarsened trabecula in keeping with Paget's disease. This is relatively similar to the study of 20/11. Degenerative changes of the visualized lower lumbar spine. No acute osseous pathology. IMPRESSION: 1. No acute/traumatic pelvic pathology. 2. Paget's disease of the sacrum. 3. Colonic diverticulosis. 4. Partially visualized 3 mm stone in the inferior pole of the left kidney. 5.  Aortic  Atherosclerosis (ICD10-I70.0). Electronically Signed   By: Anner Crete M.D.   On: 08/06/2022 21:17   DG Tibia/Fibula Left  Result Date: 08/06/2022 CLINICAL DATA:  Fall with lumbar and left foreleg pain. EXAM: LEFT TIBIA AND FIBULA - 2 VIEW; LUMBAR SPINE - COMPLETE 4+ VIEW COMPARISON:  CTA abdomen and pelvis and reconstructions 03/17/2010, CTA runoff and reconstructions 03/17/2010. FINDINGS: Lumbar spine, routine five views: There is a mild grade 1 anterolisthesis of L4-5 most likely due to facet hypertrophy. Again noted is a minimal grade 1 retrolisthesis at L1-2 and L3-4. There is no further listhesis. There is mild chronic wedging of the L3 vertebral body with otherwise normal vertebral heights and no evidence of acute fracture. There is extensive bulky bridging osteophytosis of the lumbar spine. There are degenerative discs T12-L1, L1-2, L2-3 and L3-4 similar to previous exam, normal heights of the L4-5 and L5-S1 discs. Facet joint hypertrophy is noted throughout, with multilevel degenerative foraminal stenosis progressed from 2011. This appears greatest at L3-4 and L4-5. Both SI joints are patent. 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 The visualized pelvis is unremarkable. There is heavy aortoiliac calcific plaque. Left tibia fibula series: There is mild osteopenia without  evidence of fractures or other focal bone lesion. Moderately advanced femorotibial degenerative arthrosis is again seen with meniscal chondrocalcinosis. There is mild-to-moderate degenerative arthrosis of the ankle. There is soft tissue swelling at the ankle. Soft tissues are otherwise unremarkable. IMPRESSION: 1. No evidence of acute lumbar fracture or traumatic lumbar malalignment. 2. Osteopenia without evidence of left foreleg fracture. 3. Stable mild chronic wedging of the L3 vertebral body.  4. 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 5. Aortoiliac atherosclerosis. 6. 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 7. Soft tissue swelling at the ankle. Electronically Signed   By: Telford Nab M.D.   On: 08/06/2022 20:14   DG Lumbar Spine Complete  Result Date: 08/06/2022 CLINICAL DATA:  Fall with lumbar and left foreleg pain. EXAM: LEFT TIBIA AND FIBULA - 2 VIEW; LUMBAR SPINE - COMPLETE 4+ VIEW COMPARISON:  CTA abdomen and pelvis and reconstructions 03/17/2010, CTA runoff and reconstructions 03/17/2010. FINDINGS: Lumbar spine, routine five views: There is a mild grade 1 anterolisthesis of L4-5 most likely due to facet hypertrophy. Again noted is a minimal grade 1 retrolisthesis at L1-2 and L3-4. There is no further listhesis. There is mild chronic wedging of the L3 vertebral body with otherwise normal vertebral heights and no evidence of acute fracture. There is extensive bulky bridging osteophytosis of the lumbar spine. There are degenerative discs T12-L1, L1-2, L2-3 and L3-4 similar to previous exam, normal heights of the L4-5 and L5-S1 discs. Facet joint hypertrophy is noted throughout, with multilevel degenerative foraminal stenosis progressed from 2011. This appears greatest at L3-4 and L4-5. Both SI joints are patent. 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 The visualized pelvis is unremarkable. There is heavy aortoiliac calcific plaque. Left tibia fibula series: There is mild osteopenia without evidence of fractures or other focal bone lesion. Moderately advanced femorotibial degenerative arthrosis is again seen with meniscal chondrocalcinosis. There is mild-to-moderate degenerative arthrosis of the ankle. There is soft tissue swelling at the ankle. Soft  tissues are otherwise unremarkable. IMPRESSION: 1. No evidence of acute lumbar fracture or traumatic lumbar malalignment. 2. Osteopenia without evidence of left foreleg fracture. 3. Stable mild chronic wedging of the L3 vertebral body. 4. 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 5. Aortoiliac atherosclerosis. 6. 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 7. Soft tissue swelling at the ankle. Electronically Signed   By: Telford Nab M.D.   On: 08/06/2022 20:14   DG Hip Unilat W or Wo Pelvis 2-3 Views Left  Result Date: 08/06/2022 CLINICAL DATA:  fall EXAM: DG HIP (WITH OR WITHOUT PELVIS) 2-3V LEFT COMPARISON:  CT angiography aorta runoff 03/17/2010 FINDINGS: Limited evaluation due to overlapping osseous structures and overlying soft tissues. There is no evidence of hip fracture or dislocation of the left hip. Frontal view of the right hip unremarkable. No acute displaced fracture of the bones of the pelvis. Question malalignment of the right sacroiliac joint with no definite diastasis. There is no evidence of arthropathy or aggressive appearing focal bone abnormality. Chronic exostosis along the left inferior pubic rami stable compared to prior. Vascular calcifications IMPRESSION: Question malalignment of the right sacroiliac joint with no definite diastasis. Consider additional pelvic views. Electronically Signed   By: Iven Finn M.D.   On: 08/06/2022 20:05   CT Shoulder Right Wo Contrast  Result Date: 08/06/2022  CLINICAL DATA:  Shoulder trauma, instability or dislocation suspected, xray done. Fall 2 days ago EXAM: CT OF THE UPPER RIGHT EXTREMITY WITHOUT CONTRAST TECHNIQUE: Multidetector CT imaging of the upper right extremity was performed utilizing metal artifact reduction protocol. RADIATION DOSE REDUCTION: This exam was performed according to the departmental dose-optimization program which includes  automated exposure control, adjustment of the mA and/or kV according to patient size and/or use of iterative reconstruction technique. 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 Arthroplasty components are in their expected alignment without dislocation. No periprosthetic lucency or fracture is identified. No evidence of acromial stress fracture. There is some capsular calcification noted posteriorly. AC joint intact with mild osteoarthritis. Ligaments Suboptimally assessed by CT. Muscles and Tendons Rotator cuff muscle atrophy. Soft tissues Fluid collection in the subacromial-subdeltoid bursal space measuring approximately 7.4 x 1.6 x 4.0 cm (series 7, image 54). No additional fluid collections. No right axillary lymphadenopathy. The included right lung field is clear. Aortic atherosclerosis. IMPRESSION: 1. 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 2. Moderate-large volume of fluid within the subacromial-subdeltoid bursal space suggesting bursitis. Aortic Atherosclerosis (ICD10-I70.0). Electronically Signed   By: Davina Poke D.O.   On: 08/06/2022 17:08   DG Knee 2 Views Left  Result Date: 08/06/2022 CLINICAL DATA:  Left knee pain after fall. EXAM: LEFT KNEE - 1-2 VIEW COMPARISON:  None Available. FINDINGS: No fracture or dislocation is noted. Small suprapatellar joint effusion is noted. Moderate narrowing of medial and lateral joint spaces are noted. IMPRESSION: Moderate degenerative joint disease. Small suprapatellar joint effusion. No fracture or dislocation. Electronically Signed   By: Marijo Conception M.D.   On: 08/06/2022 15:19   DG Foot Complete Left  Result Date: 08/06/2022 CLINICAL DATA:  Left foot pain after fall 2 days ago. EXAM: LEFT FOOT - COMPLETE 3+ VIEW COMPARISON:  None Available. FINDINGS: Nondisplaced fracture is seen involving the  proximal base of the first proximal phalanx. No other fracture or dislocation is noted. Mild posterior calcaneal spurring is noted. IMPRESSION: Nondisplaced fracture involving proximal base of first proximal phalanx. Electronically Signed   By: Marijo Conception M.D.   On: 08/06/2022 15:18   DG Humerus Right  Result Date: 08/06/2022 CLINICAL DATA:  Right shoulder pain after fall 2 days ago. EXAM: RIGHT HUMERUS - 2+ VIEW COMPARISON:  None Available. FINDINGS: Status post right shoulder arthroplasty. No fracture or dislocation is noted. IMPRESSION: No acute abnormality seen. Electronically Signed   By: Marijo Conception M.D.   On: 08/06/2022 15:15   DG Ankle Left Port  Result Date: 08/06/2022 CLINICAL DATA:  Left ankle pain after fall 2 days ago. EXAM: PORTABLE LEFT ANKLE - 2 VIEW COMPARISON:  None Available. FINDINGS: There is no evidence of fracture, dislocation, or joint effusion. There is no evidence of arthropathy or other focal bone abnormality. Soft tissues are unremarkable. IMPRESSION: Negative. Electronically Signed   By: Marijo Conception M.D.   On: 08/06/2022 14:00    Microbiology: Results for orders placed or performed during the hospital encounter of 09/08/10  Surgical pcr screen     Status: None   Collection Time: 09/08/10 11:03 AM  Result Value Ref Range Status   MRSA, PCR NEGATIVE NEGATIVE Final   Staphylococcus aureus  NEGATIVE Final    NEGATIVE        The Xpert SA Assay (FDA approved for NASAL specimens only), is one component of a comprehensive surveillance program.  It is not intended to diagnose infection nor to guide or monitor treatment.    Labs: CBC: Recent Labs  Lab 08/06/22 2204 08/08/22 0337  WBC 14.2* 9.8  NEUTROABS 10.3* 6.4  HGB 14.0 12.8*  HCT 40.8 39.9  MCV 88.1 92.8  PLT 230 [id number]   Basic Metabolic Panel: Recent Labs  Lab 08/06/22 2204 08/08/22 0337  NA 135 136  K 4.1 4.2  CL 101 104  CO2 25 24  GLUCOSE 194* 118*  BUN 9 9  CREATININE 0.83 0.74   CALCIUM 9.1 8.4*   Liver Function Tests: Recent Labs  Lab 08/06/22 2204  AST 17  ALT 12  ALKPHOS 222*  BILITOT 0.8  PROT 6.8  ALBUMIN 3.8   CBG: Recent Labs  Lab 08/10/22 0734 08/10/22 1108 08/10/22 1629 08/10/22 2144 08/11/22 0741  GLUCAP 118* 141* 127* 129* 114*    Discharge time spent: greater than 30 minutes.  This record has been created using Systems analyst. Errors have been sought and corrected,but may not always be located. Such creation errors do not reflect on the standard of care.   Signed: Lorella Nimrod, MD [facility] 08/11/2022

## 2022-08-11 NOTE — TOC Transition Note (Signed)
Transition of Care [facility]) - CM/SW Discharge Note  Patient Details  Name: Kenneth Henson MRN: [id number] Date of Birth: 05-07-1941  Transition of [facility]) CM/SW Contact:  Sherie Don, LCSW Phone Number: 08/11/2022, 11:17 AM  Clinical Narrative: Patient is medically stable to discharge today to [facility]. Discharge summary and hard script faxed to facility [phone]). Medical necessity form done; PTAR scheduled. Discharge packet completed. Patient and daughter notified of discharge and transportation being set up. RN updated. TOC signing off.    Final next level of care: Skilled Nursing Facility Barriers to Discharge: Barriers Resolved  Patient Goals and CMS Choice CMS Medicare.gov Compare Post Acute Care list provided to:: Patient Choice offered to / list presented to : Patient  Discharge Placement PASRR number recieved: 08/08/22         [facility] Patient to be transferred to facility by: [facility] Name of family member notified: Randey Lanctot (daughter) Patient and family notified of of transfer: 08/11/22  Discharge Plan and Services Additional resources added to the After Visit Summary for   In-house Referral: Clinical Social Work Post Acute Care Choice: [facility]          DME Arranged: N/A DME Agency: NA  Social Determinants of Health (SDOH) Interventions SDOH Screenings   Food Insecurity: No Food Insecurity (08/07/2022)  Housing: Low Risk  (08/07/2022)  Transportation Needs: No Transportation Needs (08/07/2022)  Utilities: Not At Risk (08/07/2022)  Alcohol Screen: Low Risk  (06/20/2022)  Depression (PHQ2-9): Low Risk  (06/20/2022)  Financial Resource Strain: Low Risk  (06/20/2022)  Physical Activity: Inactive (06/20/2022)  Social Connections: Moderately Integrated (06/20/2022)  Stress: No Stress Concern Present (06/20/2022)  Tobacco Use: Medium Risk (08/07/2022)   Readmission Risk Interventions     No data to  display

## 2022-08-11 NOTE — Plan of Care (Signed)
  Problem: Education: Goal: Knowledge of General Education information will improve Description Including pain rating scale, medication(s)/side effects and non-pharmacologic comfort measures Outcome: Progressing   Problem: Health Behavior/Discharge Planning: Goal: Ability to manage health-related needs will improve Outcome: Progressing   Problem: Clinical Measurements: Goal: Ability to maintain clinical measurements within normal limits will improve Outcome: Progressing   Problem: Activity: Goal: Risk for activity intolerance will decrease Outcome: Progressing   Problem: Coping: Goal: Level of anxiety will decrease Outcome: Progressing   Problem: Safety: Goal: Ability to remain free from injury will improve Outcome: Progressing   Problem: Skin Integrity: Goal: Risk for impaired skin integrity will decrease Outcome: Progressing   

## 2022-08-11 NOTE — Progress Notes (Signed)
Called facility, gave report to So, all questions answered. Pt not in acute distress, discharged to SNF with belongings via [facility].

## 2022-08-11 NOTE — Plan of Care (Signed)
Plan of care reviewed and discussed. Problem: Education: Goal: Knowledge of General Education information will improve Description: Including pain rating scale, medication(s)/side effects and non-pharmacologic comfort measures Outcome: Adequate for Discharge   Problem: Health Behavior/Discharge Planning: Goal: Ability to manage health-related needs will improve Outcome: Progressing   Problem: Clinical Measurements: Goal: Ability to maintain clinical measurements within normal limits will improve Outcome: Adequate for Discharge Goal: Will remain free from infection Outcome: Progressing Goal: Diagnostic test results will improve Outcome: Progressing Goal: Respiratory complications will improve Outcome: Progressing Goal: Cardiovascular complication will be avoided Outcome: Progressing

## 2022-08-30 NOTE — Progress Notes (Signed)
Comments: Patient resides at [facility] in [place],  [phone].  All information was obtained from the SNF faxed records and from patient's POA, Sayre Witherington  [phone]  Patient will arrive 3 hours early on DOS in order to get last minute labs.   All surgery instructions were faxed to: [facility] at [phone]  and were emailed to Mr. Chauca' Daughter, Dorothe Pea.  COVID Vaccine received:   No  Yes Date of any COVID positive Test in last 90 days:  PCP - Raymond De Peru, MD Cardiologist -   Chest x-ray - 08-10-2022  Epic  mild CM EKG - 08-06-2022  Epic  Stress Test -  ECHO - 08-08-2022 Epic Cardiac Cath -   PCR screen:  Ordered & Completed             No Order but Needs PROFEND             N/A for this surgery  Surgery Plan:   Ambulatory                             Outpatient in bed                             Admit  Anesthesia:     General   Spinal                             Choice   MAC  Pacemaker / ICD device  No  Yes   Spinal Cord Stimulator:[x]  No  Yes       History of Sleep Apnea?  No  Yes   CPAP used?-  No  Yes    Does the patient monitor blood sugar?           No  Yes  lives in a SNF    N/A  Patient has:  NO Hx DM    Pre-DM                  DM1    DM2 Does patient have a Freestyle Libre or Dexacom?  No  Yes    Diabetic medications/ instructions: Metformin  Blood Thinner / Instructions: none Aspirin Instructions:  ERAS Protocol Ordered:  No   Yes PRE-SURGERY  ENSURE   G2   No Drink Ordered Patient is to be NPO after: 10:00 am day of surgery  Activity level: Patient is unable to climb a flight of stairs without difficulty;  He resides at [facility] in [place]  Anesthesia review: HTN, PACs, Pre-DM, macular degeneration both eyes (legally blind), DOE    Patient denies shortness of breath, fever, cough and chest pain at PAT  appointment.  Patient verbalized understanding and agreement to the Pre-Surgical Instructions that were given to them at this PAT appointment. Patient was also educated of the need to review these PAT instructions again prior to his surgery.I reviewed the appropriate phone numbers to call if they have any and questions or concerns.

## 2022-08-31 ENCOUNTER — Encounter (HOSPITAL_COMMUNITY)
Admission: RE | Admit: 2022-08-31 | Discharge: 2022-08-31 | Disposition: A | Discharge status: Home / Self Care | Payer: Medicare Other | Source: Ambulatory Visit | Attending: Orthopaedic Surgery | Admitting: Orthopaedic Surgery

## 2022-08-31 DIAGNOSIS — E119 Type 2 diabetes mellitus without complications: Secondary | ICD-10-CM

## 2022-08-31 DIAGNOSIS — Z01818 Encounter for other preprocedural examination: Secondary | ICD-10-CM

## 2022-08-31 NOTE — Patient Instructions (Addendum)
SURGICAL WAITING ROOM VISITATION Patients having surgery or a procedure may have no more than 2 support people in the waiting area - these visitors may rotate in the visitor waiting room.   Due to an increase in RSV and influenza rates and associated hospitalizations, children ages 96 and under may not visit patients in [facility] hospitals. If the patient needs to stay at the hospital during part of their recovery, the visitor guidelines for inpatient rooms apply.  PRE-OP VISITATION  Pre-op nurse will coordinate an appropriate time for 1 support person to accompany the patient in pre-op.  This support person may not rotate.  This visitor will be contacted when the time is appropriate for the visitor to come back in the pre-op area.  Please refer to the [facility] website for the visitor guidelines for Inpatients (after your surgery is over and you are in a regular room).  You are not required to quarantine at this time prior to your surgery. However, you must do this: Hand Hygiene often Do NOT share personal items Notify your provider if you are in close contact with someone who has COVID or you develop fever 100.4 or greater, new onset of sneezing, cough, sore throat, shortness of breath or body aches.  If you test positive for Covid or have been in contact with anyone that has tested positive in the last 10 days please notify you surgeon.    Your procedure is scheduled on:  Wednesday  September 06, 2022  Report to [facility] Main Entrance: [facility] entrance where the [employer] is available.   Report to admitting at: 10:00    AM  +++++Call this number if you have any questions or problems the morning of surgery [phone]  Do not eat food after Midnight the night prior to your surgery/procedure.  After Midnight you may have the following liquids until 09:30 AM DAY OF SURGERY  Clear Liquid Diet Water Black Coffee (sugar ok, NO MILK/CREAM OR CREAMERS)  Tea (sugar ok, NO  MILK/CREAM OR CREAMERS) regular and decaf                             Plain Jell-O  with no fruit (NO RED)                                           Fruit ices (not with fruit pulp, NO RED)                                     Popsicles (NO RED)                                                                  Juice: apple, WHITE grape, WHITE cranberry Sports drinks like Gatorade or Powerade (NO RED)             FOLLOW ANY ADDITIONAL PRE OP INSTRUCTIONS YOU RECEIVED FROM YOUR SURGEON'S OFFICE!!!   Oral Hygiene is also important to reduce your risk of infection.  Remember - BRUSH YOUR TEETH THE MORNING OF SURGERY WITH YOUR REGULAR TOOTHPASTE  Do NOT smoke after Midnight the night before surgery.  Take ONLY these medicines the morning of surgery with A SIP OF WATER: Amlodipine, patient may take gabapentin, Tylenol if needed for pain                   You may not have any metal on your body including  jewelry, and body piercing  Do not wear lotions, powders,cologne, or deodorant  Men may shave face and neck.  Contacts, Hearing Aids, dentures or bridgework may not be worn into surgery. DENTURES WILL BE REMOVED PRIOR TO SURGERY PLEASE DO NOT APPLY "Poly grip" OR ADHESIVES!!!  You may bring a small overnight bag with you on the day of surgery, only pack items that are not valuable. [facility] IS NOT RESPONSIBLE   FOR VALUABLES THAT ARE LOST OR STOLEN.   Do not bring your home medications to the hospital. The Pharmacy will dispense medications listed on your medication list to you during your admission in the Hospital.  Special Instructions: Bring a copy of your healthcare power of attorney and living will documents the day of surgery, if you wish to have them scanned into your [facility]- EPIC  Please read over the following fact sheets you were given: IF YOU HAVE QUESTIONS ABOUT YOUR PRE-OP INSTRUCTIONS, PLEASE CALL [phone].   [place] - Preparing for  Surgery Before surgery, you can play an important role.  Because skin is not sterile, your skin needs to be as free of germs as possible.  You can reduce the number of germs on your skin by washing with CHG (chlorahexidine gluconate) soap before surgery.  CHG is an antiseptic cleaner which kills germs and bonds with the skin to continue killing germs even after washing. Please DO NOT use if you have an allergy to CHG or antibacterial soaps.  If your skin becomes reddened/irritated stop using the CHG and inform your nurse when you arrive at Short Stay. Do not shave (including legs and underarms) for at least 48 hours prior to the first CHG shower.  You may shave your face/neck.  Please follow these instructions carefully:  1.  Shower with CHG Soap the night before surgery and the  morning of surgery.  2.  If you choose to wash your hair, wash your hair first as usual with your normal  shampoo.  3.  After you shampoo, rinse your hair and body thoroughly to remove the shampoo.                             4.  Use CHG as you would any other liquid soap.  You can apply chg directly to the skin and wash.  Gently with a scrungie or clean washcloth.  5.  Apply the CHG Soap to your body ONLY FROM THE NECK DOWN.   Do not use on face/ open                           Wound or open sores. Avoid contact with eyes, ears mouth and genitals (private parts).                       Wash face,  Genitals (private parts) with your normal soap.  6.  Wash thoroughly, paying special attention to the area where your  surgery  will be performed.  7.  Thoroughly rinse your body with warm water from the neck down.  8.  DO NOT shower/wash with your normal soap after using and rinsing off the CHG Soap.            9.  Pat yourself dry with a clean towel.            10.  Wear clean pajamas.            11.  Place clean sheets on your bed the night of your first shower and do not  sleep with pets.  ON THE DAY OF SURGERY  : Do not apply any lotions/deodorants the morning of surgery.  Please wear clean clothes to the hospital/surgery center.    FAILURE TO FOLLOW THESE INSTRUCTIONS MAY RESULT IN THE CANCELLATION OF YOUR SURGERY  PATIENT SIGNATURE_________________________________  NURSE SIGNATURE__________________________________  ________________________________________________________________________

## 2022-09-05 NOTE — H&P (Signed)
PREOPERATIVE H&P  Chief Complaint: right shoulder dislocation and hardware failure  HPI: Kenneth Henson is a 82 y.o. male who presents for preoperative history and physical prior to scheduled surgery, Procedure(s): TOTAL SHOULDER REVISION MINOR HARDWARE REMOVAL OPEN REDUCTION INTERNAL FIXATION (ORIF) PROXIMAL HUMERUS FRACTURE.   Patient has a past medical history significant for HLD, HTN, DM2.   The patient is an 82 year old who had a right shoulder reverse shoulder arthroplasty done with [employer] implants about four years ago in [place].  He had done well reportedly, but he had a fall and was hospitalized in the late part of March on March 22nd.  He had a CT scan two days after his fall which demonstrated a subluxation of his shoulder joint on my review; however, there is a chest x-ray while he was in the hospital that demonstrates a likely complete dislocation, though it is incompletely evaluated.  He says his shoulder felt fine until his fall.  He had no signs of fever, erythema, or drainage.  He had no history of infection in the shoulder.  He has a left reverse total shoulder arthroplasty done by Dr. Beverely Low that has done well for about the last 10 years.   Symptoms are rated as moderate to severe, and have been worsening.  This is significantly impairing activities of daily living.    Please see clinic note for further details on this patient's care.    He has elected for surgical management.   Past Medical History:  Diagnosis Date   Abnormal involuntary movements(781.0)    Arthritis    Chicken pox    Claudication (HCC)    Colon polyps    Diabetes mellitus type II    Erectile dysfunction    Hyperlipidemia    Hypertension    Neoplasm of skin    uncertain behavior   Past Surgical History:  Procedure Laterality Date   CARPAL TUNNEL RELEASE     right   HAND SURGERY     KNEE SURGERY     NECK SURGERY     SHOULDER SURGERY Right    2020   SHOULDER SURGERY  Left    2011   Social History   Socioeconomic History   Marital status: Single    Spouse name: Not on file   Number of children: Not on file   Years of education: Not on file   Highest education level: Not on file  Occupational History   Not on file  Tobacco Use   Smoking status: Former    Types: Cigarettes    Quit date: 05/15/2009    Years since quitting: 13.3   Smokeless tobacco: Never  Substance and Sexual Activity   Alcohol use: Yes    Alcohol/week: 3.0 standard drinks of alcohol    Types: 1 Glasses of wine, 1 Cans of beer, 1 Shots of liquor per week    Comment: social   Drug use: No   Sexual activity: Not on file  Other Topics Concern   Not on file  Social History Narrative   Not on file   Social Determinants of Health   Financial Resource Strain: Low Risk  (06/20/2022)   Overall Financial Resource Strain (CARDIA)    Difficulty of Paying Living Expenses: Not hard at all  Food Insecurity: No Food Insecurity (08/07/2022)   Hunger Vital Sign    Worried About Running Out of Food in the Last Year: Never true    Ran Out of Food in the  Last Year: Never true  Transportation Needs: No Transportation Needs (08/07/2022)   PRAPARE - Administrator, Civil Service (Medical): No    Lack of Transportation (Non-Medical): No  Physical Activity: Inactive (06/20/2022)   Exercise Vital Sign    Days of Exercise per Week: 0 days    Minutes of Exercise per Session: 0 min  Stress: No Stress Concern Present (06/20/2022)   [employer] of Occupational Health - Occupational Stress Questionnaire    Feeling of Stress : Not at all  Social Connections: Moderately Integrated (06/20/2022)   Social Connection and Isolation Panel [NHANES]    Frequency of Communication with Friends and Family: More than three times a week    Frequency of Social Gatherings with Friends and Family: More than three times a week    Attends Religious Services: More than 4 times per year    Active Member of  [employer] or Organizations: Yes    Attends Engineer, structural: More than 4 times per year    Marital Status: Divorced   Family History  Problem Relation Age of Onset   Hypertension Mother    Heart disease Mother    Stroke Father    Colon cancer Neg Hx    Stomach cancer Neg Hx    Breast cancer Sister    Colon polyps Sister    Allergies  Allergen Reactions   Doxycycline Nausea And Vomiting   Oxycodone-Acetaminophen Itching   Prior to Admission medications   Medication Sig Start Date End Date Taking? Authorizing Provider  acetaminophen (TYLENOL) 500 MG tablet Take 1,000 mg by mouth as needed for moderate pain.   Yes [provider]  albuterol (VENTOLIN HFA) 108 (90 Base) MCG/ACT inhaler Inhale 2 puffs into the lungs every 6 (six) hours as needed for wheezing or shortness of breath.   Yes [provider]  allopurinol (ZYLOPRIM) 300 MG tablet Take 300 mg by mouth every 12 (twelve) hours as needed (gout).   Yes [provider]  amLODipine (NORVASC) 10 MG tablet Take 1 tablet (10 mg total) by mouth daily. 08/11/22  Yes Arnetha Courser, MD  gabapentin (NEURONTIN) 300 MG capsule Take 1 tab before bed x 2 weeks.  May increase to 1 tab at dinner for 2 weeks and then may increase to am, dinner and before bed thereafter for pain. Patient taking differently: Take 300 mg by mouth 2 (two) times daily. at supper and bedtime 10/03/21  Yes Delories Heinz, DPM  LINZESS 145 MCG CAPS capsule Take 145 mcg by mouth as needed (constipation). 07/04/22 07/05/23 Yes [provider]  lisinopril (PRINIVIL,ZESTRIL) 40 MG tablet Take 1 tablet (40 mg total) by mouth daily. 05/29/11  Yes Sheliah Hatch, MD  metFORMIN (GLUCOPHAGE) 850 MG tablet Take 1 tablet (850 mg total) by mouth daily. 05/29/11  Yes Sheliah Hatch, MD  mirtazapine (REMERON) 15 MG tablet Take 15 mg by mouth at bedtime. 04/04/22  Yes [provider]  Multiple Vitamins-Minerals (PRESERVISION  AREDS PO) Take 1 tablet by mouth 2 (two) times daily.   Yes [provider]  ondansetron (ZOFRAN) 4 MG tablet Take 1 tablet (4 mg total) by mouth every 6 (six) hours as needed for nausea. 08/11/22  Yes Arnetha Courser, MD  simvastatin (ZOCOR) 20 MG tablet Take 1 tablet (20 mg total) by mouth daily. 02/24/11  Yes Sheliah Hatch, MD  temazepam (RESTORIL) 15 MG capsule Take 15 mg by mouth at bedtime.   Yes [provider]  diclofenac Sodium (VOLTAREN) 1 % GEL Apply 2 g topically 4 (four) times daily. 04/19/22   Jeannie Fend, PA-C  HYDROcodone-acetaminophen (NORCO/VICODIN) 5-325 MG tablet Take 1 tablet by mouth every 4 (four) hours as needed. Patient not taking: Reported on 09/04/2022 08/06/22   Glynn Octave, MD  HYDROcodone-acetaminophen (NORCO/VICODIN) 5-325 MG tablet Take 1-2 tablets by mouth every 6 (six) hours as needed for moderate pain or severe pain. Patient not taking: Reported on 09/04/2022 08/08/22 08/08/23  Jenne Pane, PA-C  ibuprofen (ADVIL) 200 MG tablet Take 400 mg by mouth every 4 (four) hours as needed for moderate pain.    [provider]  sildenafil (VIAGRA) 100 MG tablet Take 1 tablet (100 mg total) by mouth as needed for erectile dysfunction. Patient not taking: Reported on 09/04/2022 01/05/11 09/04/22  Sheliah Hatch, MD    ROS: All other systems have been reviewed and were otherwise negative with the exception of those mentioned in the HPI and as above.  Physical Exam: General: Alert, no acute distress Cardiovascular: No pedal edema Respiratory: No cyanosis, no use of accessory musculature GI: No organomegaly, abdomen is soft and non-tender Skin: No lesions in the area of chief complaint Neurologic: Sensation intact distally Psychiatric: Patient is competent for consent with normal mood and affect Lymphatic: No axillary or cervical lymphadenopathy  MUSCULOSKELETAL:  On exam, he is unable to raise the right shoulder secondary to  injury.  He has a warm, well perfused extremity.  His axillary nerve sensation appears to be altered compared to the contralateral side; however, the deltoid does appear to be firing.  There is no skin breakdown.  Imaging: X-rays reviewed from our clinic demonstrate a complete dislocation of the right shoulder.  There is no obvious implant loosening.  These x-rays were ordered and reviewed by me.  BMI: Estimated body mass index is 38.25 kg/m as calculated from the following:   Height as of 08/06/22:  (1.753 m).   Weight as of 08/06/22: 117.5 kg.  Lab Results  Component Value Date   ALBUMIN 3.8 08/06/2022   Diabetes:   Patient has a diagnosis of diabetes,  Lab Results  Component Value Date   HGBA1C 7.2 (H) 08/07/2022   Smoking Status:      Assessment: right shoulder dislocation and hardware failure  Plan: Plan for Procedure(s): TOTAL SHOULDER REVISION MINOR HARDWARE REMOVAL OPEN REDUCTION INTERNAL FIXATION (ORIF) PROXIMAL HUMERUS FRACTURE  The patient's shoulder has been dislocated now for at least a couple of weeks approaching three weeks.  Based on this, I think the likelihood of success with a closed reduction alone and avoiding continued instability is extremely low.  With that said, he is at a high risk for complications.  He has multiple medical co-morbidities and home oxygen.  He will likely require open reduction internal fixation with likely revision of the implants.  He is at extremely high risk for infection, periprosthetic fracture, and continued instability.  All questions were answered.  We will work on scheduling at the next available time.  Unfortunately for the patient, we have attempted to contact multiple avenues to the hospital, and we cannot find any appropriate times as this is a case that will require a high level of skill of the surgical team.  We will get him a sling today, and if there are any openings in the surgical schedule we will move him  forward.  The risks benefits and alternatives were discussed with the patient including but not limited to  the risks of nonoperative treatment, versus surgical intervention including infection, bleeding, nerve injury,  blood clots, cardiopulmonary complications, morbidity, mortality, among others, and they were willing to proceed.   We additionally specifically discussed risks of axillary nerve injury, infection, periprosthetic fracture, continued pain and longevity of implants prior to beginning procedure.    Patient will be admitted for inpatient treatment for surgery, pain control, OT, prophylactic antibiotics, VTE prophylaxis, and discharge planning. The patient is planning to be discharged home with outpatient PT.   The patient acknowledged the explanation, agreed to proceed with the plan and consent was signed.   Operative Plan: Right revision reverse total shoulder arthroplasty Discharge Medications: standard DVT Prophylaxis: aspirin Physical Therapy: outpatient PT - delayed Special Discharge needs: Sling (should bring with  him). IceMan   Vernetta Honey, PA-C  09/05/2022 8:49 AM

## 2022-09-06 ENCOUNTER — Other Ambulatory Visit: Payer: Self-pay

## 2022-09-06 ENCOUNTER — Inpatient Hospital Stay (HOSPITAL_COMMUNITY): Payer: Medicare Other | Admitting: Anesthesiology

## 2022-09-06 ENCOUNTER — Inpatient Hospital Stay (HOSPITAL_COMMUNITY)
Admission: RE | Admit: 2022-09-06 | Discharge: 2022-09-07 | DRG: 497 | Disposition: A | Discharge status: Home Health | Payer: Medicare Other | Attending: Orthopaedic Surgery | Admitting: Orthopaedic Surgery

## 2022-09-06 ENCOUNTER — Encounter (HOSPITAL_COMMUNITY): Payer: Self-pay | Admitting: Orthopaedic Surgery

## 2022-09-06 ENCOUNTER — Inpatient Hospital Stay (HOSPITAL_COMMUNITY): Payer: Medicare Other

## 2022-09-06 ENCOUNTER — Encounter (HOSPITAL_COMMUNITY)
Admission: RE | Disposition: A | Discharge status: Home Health | Payer: Self-pay | Source: Home / Self Care | Attending: Orthopaedic Surgery

## 2022-09-06 DIAGNOSIS — M7501 Adhesive capsulitis of right shoulder: Secondary | ICD-10-CM | POA: Diagnosis present

## 2022-09-06 DIAGNOSIS — Z96611 Presence of right artificial shoulder joint: Secondary | ICD-10-CM | POA: Diagnosis present

## 2022-09-06 DIAGNOSIS — Z87891 Personal history of nicotine dependence: Secondary | ICD-10-CM | POA: Diagnosis not present

## 2022-09-06 DIAGNOSIS — Z7984 Long term (current) use of oral hypoglycemic drugs: Secondary | ICD-10-CM | POA: Diagnosis not present

## 2022-09-06 DIAGNOSIS — Z79899 Other long term (current) drug therapy: Secondary | ICD-10-CM

## 2022-09-06 DIAGNOSIS — T84028A Dislocation of other internal joint prosthesis, initial encounter: Principal | ICD-10-CM | POA: Diagnosis present

## 2022-09-06 DIAGNOSIS — Y792 Prosthetic and other implants, materials and accessory orthopedic devices associated with adverse incidents: Secondary | ICD-10-CM | POA: Diagnosis present

## 2022-09-06 DIAGNOSIS — T84068A Wear of articular bearing surface of other internal prosthetic joint, initial encounter: Secondary | ICD-10-CM | POA: Diagnosis present

## 2022-09-06 DIAGNOSIS — E785 Hyperlipidemia, unspecified: Secondary | ICD-10-CM | POA: Diagnosis present

## 2022-09-06 DIAGNOSIS — W19XXXA Unspecified fall, initial encounter: Secondary | ICD-10-CM | POA: Diagnosis present

## 2022-09-06 DIAGNOSIS — E1151 Type 2 diabetes mellitus with diabetic peripheral angiopathy without gangrene: Secondary | ICD-10-CM | POA: Diagnosis present

## 2022-09-06 DIAGNOSIS — Z885 Allergy status to narcotic agent status: Secondary | ICD-10-CM | POA: Diagnosis not present

## 2022-09-06 DIAGNOSIS — Z803 Family history of malignant neoplasm of breast: Secondary | ICD-10-CM | POA: Diagnosis not present

## 2022-09-06 DIAGNOSIS — I10 Essential (primary) hypertension: Secondary | ICD-10-CM

## 2022-09-06 DIAGNOSIS — Z823 Family history of stroke: Secondary | ICD-10-CM | POA: Diagnosis not present

## 2022-09-06 DIAGNOSIS — T84018A Broken internal joint prosthesis, other site, initial encounter: Secondary | ICD-10-CM | POA: Diagnosis not present

## 2022-09-06 DIAGNOSIS — Z8719 Personal history of other diseases of the digestive system: Secondary | ICD-10-CM | POA: Diagnosis not present

## 2022-09-06 DIAGNOSIS — S43084A Other dislocation of right shoulder joint, initial encounter: Secondary | ICD-10-CM | POA: Diagnosis present

## 2022-09-06 DIAGNOSIS — Z83719 Family history of colon polyps, unspecified: Secondary | ICD-10-CM

## 2022-09-06 DIAGNOSIS — Z96612 Presence of left artificial shoulder joint: Secondary | ICD-10-CM | POA: Diagnosis present

## 2022-09-06 DIAGNOSIS — Z8249 Family history of ischemic heart disease and other diseases of the circulatory system: Secondary | ICD-10-CM | POA: Diagnosis not present

## 2022-09-06 DIAGNOSIS — Z01818 Encounter for other preprocedural examination: Secondary | ICD-10-CM

## 2022-09-06 DIAGNOSIS — Z881 Allergy status to other antibiotic agents status: Secondary | ICD-10-CM | POA: Diagnosis not present

## 2022-09-06 DIAGNOSIS — E119 Type 2 diabetes mellitus without complications: Secondary | ICD-10-CM

## 2022-09-06 DIAGNOSIS — S46011A Strain of muscle(s) and tendon(s) of the rotator cuff of right shoulder, initial encounter: Secondary | ICD-10-CM | POA: Diagnosis present

## 2022-09-06 HISTORY — PX: MINOR HARDWARE REMOVAL: SHX6474

## 2022-09-06 HISTORY — PX: ORIF HUMERUS FRACTURE: SHX2126

## 2022-09-06 HISTORY — PX: TOTAL SHOULDER REVISION: SHX6130

## 2022-09-06 LAB — SURGICAL PCR SCREEN
MRSA, PCR: NEGATIVE
Staphylococcus aureus: POSITIVE — AB

## 2022-09-06 LAB — AEROBIC/ANAEROBIC CULTURE W GRAM STAIN (SURGICAL/DEEP WOUND)

## 2022-09-06 LAB — GLUCOSE, CAPILLARY
Glucose-Capillary: 113 mg/dL — ABNORMAL HIGH (ref 70–99)
Glucose-Capillary: 128 mg/dL — ABNORMAL HIGH (ref 70–99)

## 2022-09-06 LAB — TYPE AND SCREEN
ABO/RH(D): O POS
Antibody Screen: NEGATIVE

## 2022-09-06 SURGERY — REVISION, TOTAL ARTHROPLASTY, SHOULDER
Anesthesia: Regional | Site: Shoulder | Laterality: Right

## 2022-09-06 MED ORDER — HYDROCODONE-ACETAMINOPHEN 5-325 MG PO TABS: 1.0000 | ORAL_TABLET | ORAL | Status: DC | PRN

## 2022-09-06 MED ORDER — SUGAMMADEX SODIUM 200 MG/2ML IV SOLN
INTRAVENOUS | Status: DC | PRN
  Administered 2022-09-06: 200 mg via INTRAVENOUS

## 2022-09-06 MED ORDER — AMLODIPINE BESYLATE 10 MG PO TABS
10.0000 mg | ORAL_TABLET | Freq: Every day | ORAL | Status: DC
  Administered 2022-09-06 – 2022-09-07 (×2): 10 mg via ORAL
  Filled 2022-09-06 (×2): qty 1

## 2022-09-06 MED ORDER — FENTANYL CITRATE PF 50 MCG/ML IJ SOSY
100.0000 ug | PREFILLED_SYRINGE | INTRAMUSCULAR | Status: DC
  Administered 2022-09-06: 50 ug via INTRAVENOUS
  Filled 2022-09-06: qty 2

## 2022-09-06 MED ORDER — LIDOCAINE HCL (PF) 2 % IJ SOLN
INTRAMUSCULAR | Status: AC
  Filled 2022-09-06: qty 5

## 2022-09-06 MED ORDER — FENTANYL CITRATE PF 50 MCG/ML IJ SOSY
25.0000 ug | PREFILLED_SYRINGE | INTRAMUSCULAR | Status: DC | PRN
  Administered 2022-09-06: 50 ug via INTRAVENOUS

## 2022-09-06 MED ORDER — CELECOXIB 200 MG PO CAPS
200.0000 mg | ORAL_CAPSULE | Freq: Two times a day (BID) | ORAL | Status: DC
  Administered 2022-09-06 – 2022-09-07 (×2): 200 mg via ORAL
  Filled 2022-09-06 (×2): qty 1

## 2022-09-06 MED ORDER — TRANEXAMIC ACID-NACL 1000-0.7 MG/100ML-% IV SOLN
1000.0000 mg | INTRAVENOUS | Status: AC
  Administered 2022-09-06: 1000 mg via INTRAVENOUS
  Filled 2022-09-06: qty 100

## 2022-09-06 MED ORDER — MIRTAZAPINE 15 MG PO TABS
15.0000 mg | ORAL_TABLET | Freq: Every day | ORAL | Status: DC
  Administered 2022-09-06: 15 mg via ORAL
  Filled 2022-09-06: qty 1

## 2022-09-06 MED ORDER — PROPOFOL 10 MG/ML IV BOLUS
INTRAVENOUS | Status: DC | PRN
  Administered 2022-09-06: 140 mg via INTRAVENOUS

## 2022-09-06 MED ORDER — ROCURONIUM BROMIDE 10 MG/ML (PF) SYRINGE
PREFILLED_SYRINGE | INTRAVENOUS | Status: AC
  Filled 2022-09-06: qty 10

## 2022-09-06 MED ORDER — METOCLOPRAMIDE HCL 5 MG PO TABS: 5.0000 mg | ORAL_TABLET | Freq: Three times a day (TID) | ORAL | Status: DC | PRN

## 2022-09-06 MED ORDER — MENTHOL 3 MG MT LOZG: 1.0000 | LOZENGE | OROMUCOSAL | Status: DC | PRN

## 2022-09-06 MED ORDER — PHENYLEPHRINE HCL-NACL 20-0.9 MG/250ML-% IV SOLN
INTRAVENOUS | Status: DC | PRN
  Administered 2022-09-06: 20 ug/min via INTRAVENOUS

## 2022-09-06 MED ORDER — PHENOL 1.4 % MT LIQD: 1.0000 | OROMUCOSAL | Status: DC | PRN

## 2022-09-06 MED ORDER — AMISULPRIDE (ANTIEMETIC) 5 MG/2ML IV SOLN: 10.0000 mg | Freq: Once | INTRAVENOUS | Status: DC | PRN

## 2022-09-06 MED ORDER — BISACODYL 10 MG RE SUPP: 10.0000 mg | Freq: Every day | RECTAL | Status: DC | PRN

## 2022-09-06 MED ORDER — BUPIVACAINE-EPINEPHRINE (PF) 0.5% -1:200000 IJ SOLN
INTRAMUSCULAR | Status: DC | PRN
  Administered 2022-09-06: 30 mL via PERINEURAL

## 2022-09-06 MED ORDER — GABAPENTIN 300 MG PO CAPS
300.0000 mg | ORAL_CAPSULE | Freq: Two times a day (BID) | ORAL | Status: DC
  Administered 2022-09-06 – 2022-09-07 (×2): 300 mg via ORAL
  Filled 2022-09-06 (×2): qty 1

## 2022-09-06 MED ORDER — PHENYLEPHRINE HCL (PRESSORS) 10 MG/ML IV SOLN
INTRAVENOUS | Status: AC
  Filled 2022-09-06: qty 1

## 2022-09-06 MED ORDER — METOCLOPRAMIDE HCL 5 MG/ML IJ SOLN: 5.0000 mg | Freq: Three times a day (TID) | INTRAMUSCULAR | Status: DC | PRN

## 2022-09-06 MED ORDER — LISINOPRIL 20 MG PO TABS
40.0000 mg | ORAL_TABLET | Freq: Every day | ORAL | Status: DC
  Administered 2022-09-06 – 2022-09-07 (×2): 40 mg via ORAL
  Filled 2022-09-06 (×3): qty 2

## 2022-09-06 MED ORDER — DEXAMETHASONE SODIUM PHOSPHATE 10 MG/ML IJ SOLN
INTRAMUSCULAR | Status: AC
  Filled 2022-09-06: qty 1

## 2022-09-06 MED ORDER — PRONTOSAN WOUND IRRIGATION OPTIME
TOPICAL | Status: DC | PRN
  Administered 2022-09-06: 350 mL via TOPICAL

## 2022-09-06 MED ORDER — ONDANSETRON HCL 4 MG/2ML IJ SOLN
INTRAMUSCULAR | Status: AC
  Filled 2022-09-06: qty 2

## 2022-09-06 MED ORDER — ALBUTEROL SULFATE (2.5 MG/3ML) 0.083% IN NEBU: 2.5000 mg | INHALATION_SOLUTION | Freq: Four times a day (QID) | RESPIRATORY_TRACT | Status: DC | PRN

## 2022-09-06 MED ORDER — ACETAMINOPHEN 500 MG PO TABS
1000.0000 mg | ORAL_TABLET | Freq: Once | ORAL | Status: AC
  Administered 2022-09-06: 1000 mg via ORAL
  Filled 2022-09-06: qty 2

## 2022-09-06 MED ORDER — LACTATED RINGERS IV SOLN: INTRAVENOUS | Status: DC

## 2022-09-06 MED ORDER — MORPHINE SULFATE (PF) 2 MG/ML IV SOLN
0.5000 mg | INTRAVENOUS | Status: DC | PRN
  Administered 2022-09-06: 1 mg via INTRAVENOUS
  Filled 2022-09-06: qty 1

## 2022-09-06 MED ORDER — SODIUM CHLORIDE 0.9 % IR SOLN
Status: DC | PRN
  Administered 2022-09-06: 3000 mL

## 2022-09-06 MED ORDER — CEFAZOLIN SODIUM-DEXTROSE 1-4 GM/50ML-% IV SOLN
1.0000 g | Freq: Four times a day (QID) | INTRAVENOUS | Status: AC
  Administered 2022-09-06 – 2022-09-07 (×3): 1 g via INTRAVENOUS
  Filled 2022-09-06 (×3): qty 50

## 2022-09-06 MED ORDER — ACETAMINOPHEN 500 MG PO TABS
500.0000 mg | ORAL_TABLET | Freq: Four times a day (QID) | ORAL | Status: DC
  Administered 2022-09-06 – 2022-09-07 (×3): 500 mg via ORAL
  Filled 2022-09-06 (×5): qty 1

## 2022-09-06 MED ORDER — DIPHENHYDRAMINE HCL 12.5 MG/5ML PO ELIX: 12.5000 mg | ORAL_SOLUTION | ORAL | Status: DC | PRN

## 2022-09-06 MED ORDER — ONDANSETRON HCL 4 MG/2ML IJ SOLN: 4.0000 mg | Freq: Once | INTRAMUSCULAR | Status: DC | PRN

## 2022-09-06 MED ORDER — METFORMIN HCL 850 MG PO TABS
850.0000 mg | ORAL_TABLET | Freq: Every day | ORAL | Status: DC
  Administered 2022-09-07: 850 mg via ORAL
  Filled 2022-09-06: qty 1

## 2022-09-06 MED ORDER — LINACLOTIDE 145 MCG PO CAPS: 145.0000 ug | ORAL_CAPSULE | Freq: Every day | ORAL | Status: DC | PRN

## 2022-09-06 MED ORDER — ORAL CARE MOUTH RINSE: 15.0000 mL | Freq: Once | OROMUCOSAL | Status: AC

## 2022-09-06 MED ORDER — ONDANSETRON HCL 4 MG/2ML IJ SOLN: 4.0000 mg | Freq: Four times a day (QID) | INTRAMUSCULAR | Status: DC | PRN

## 2022-09-06 MED ORDER — 0.9 % SODIUM CHLORIDE (POUR BTL) OPTIME
TOPICAL | Status: DC | PRN
  Administered 2022-09-06: 1000 mL

## 2022-09-06 MED ORDER — MIDAZOLAM HCL 2 MG/2ML IJ SOLN
2.0000 mg | INTRAMUSCULAR | Status: DC
  Filled 2022-09-06: qty 2

## 2022-09-06 MED ORDER — MAGNESIUM CITRATE PO SOLN: 1.0000 | Freq: Once | ORAL | Status: DC | PRN

## 2022-09-06 MED ORDER — DOCUSATE SODIUM 100 MG PO CAPS
100.0000 mg | ORAL_CAPSULE | Freq: Two times a day (BID) | ORAL | Status: DC
  Administered 2022-09-06 – 2022-09-07 (×2): 100 mg via ORAL
  Filled 2022-09-06 (×2): qty 1

## 2022-09-06 MED ORDER — DEXAMETHASONE SODIUM PHOSPHATE 10 MG/ML IJ SOLN
INTRAMUSCULAR | Status: DC | PRN
  Administered 2022-09-06: 10 mg via INTRAVENOUS

## 2022-09-06 MED ORDER — HYDROCODONE-ACETAMINOPHEN 7.5-325 MG PO TABS
1.0000 | ORAL_TABLET | ORAL | Status: DC | PRN
  Administered 2022-09-06 – 2022-09-07 (×3): 1 via ORAL
  Filled 2022-09-06 (×3): qty 1
  Filled 2022-09-06: qty 2

## 2022-09-06 MED ORDER — CEFAZOLIN SODIUM-DEXTROSE 2-4 GM/100ML-% IV SOLN
2.0000 g | INTRAVENOUS | Status: AC
  Administered 2022-09-06: 2 g via INTRAVENOUS
  Filled 2022-09-06: qty 100

## 2022-09-06 MED ORDER — LIDOCAINE 2% (20 MG/ML) 5 ML SYRINGE
INTRAMUSCULAR | Status: DC | PRN
  Administered 2022-09-06: 40 mg via INTRAVENOUS

## 2022-09-06 MED ORDER — ONDANSETRON HCL 4 MG/2ML IJ SOLN
INTRAMUSCULAR | Status: DC | PRN
  Administered 2022-09-06: 4 mg via INTRAVENOUS

## 2022-09-06 MED ORDER — POLYETHYLENE GLYCOL 3350 17 G PO PACK: 17.0000 g | PACK | Freq: Every day | ORAL | Status: DC | PRN

## 2022-09-06 MED ORDER — ROCURONIUM BROMIDE 10 MG/ML (PF) SYRINGE
PREFILLED_SYRINGE | INTRAVENOUS | Status: DC | PRN
  Administered 2022-09-06: 60 mg via INTRAVENOUS

## 2022-09-06 MED ORDER — SIMVASTATIN 20 MG PO TABS
20.0000 mg | ORAL_TABLET | Freq: Every day | ORAL | Status: DC
  Administered 2022-09-06: 20 mg via ORAL
  Filled 2022-09-06: qty 1

## 2022-09-06 MED ORDER — ONDANSETRON HCL 4 MG PO TABS: 4.0000 mg | ORAL_TABLET | Freq: Four times a day (QID) | ORAL | Status: DC | PRN

## 2022-09-06 MED ORDER — FENTANYL CITRATE PF 50 MCG/ML IJ SOSY
PREFILLED_SYRINGE | INTRAMUSCULAR | Status: AC
  Administered 2022-09-06: 50 ug via INTRAVENOUS
  Filled 2022-09-06: qty 2

## 2022-09-06 MED ORDER — TEMAZEPAM 15 MG PO CAPS
15.0000 mg | ORAL_CAPSULE | Freq: Every day | ORAL | Status: DC
  Administered 2022-09-06: 15 mg via ORAL
  Filled 2022-09-06: qty 1

## 2022-09-06 MED ORDER — CHLORHEXIDINE GLUCONATE 0.12 % MT SOLN
15.0000 mL | Freq: Once | OROMUCOSAL | Status: AC
  Administered 2022-09-06: 15 mL via OROMUCOSAL

## 2022-09-06 MED ORDER — CHLORHEXIDINE GLUCONATE CLOTH 2 % EX PADS
6.0000 | MEDICATED_PAD | Freq: Every day | CUTANEOUS | Status: DC
  Administered 2022-09-07: 6 via TOPICAL

## 2022-09-06 SURGICAL SUPPLY — 101 items
AID PSTN UNV HD RSTRNT DISP (MISCELLANEOUS) ×2
APL PRP STRL LF DISP 70% ISPRP (MISCELLANEOUS) ×4
APL SKNCLS STERI-STRIP NONHPOA (GAUZE/BANDAGES/DRESSINGS)
BAG COUNTER SPONGE SURGICOUNT (BAG) ×2 IMPLANT
BAG SPNG CNTER NS LX DISP (BAG) ×2
BENZOIN TINCTURE PRP APPL 2/3 (GAUZE/BANDAGES/DRESSINGS) ×2 IMPLANT
BLADE SAW SAG 29X58X.64 (BLADE) IMPLANT
BLADE SAW SGTL 73X25 THK (BLADE) ×2 IMPLANT
BLADE SAW SGTL 81X20 HD (BLADE) ×2 IMPLANT
BRUSH FEMORAL CANAL (MISCELLANEOUS) IMPLANT
CHLORAPREP W/TINT 26 (MISCELLANEOUS) IMPLANT
CLSR STERI-STRIP ANTIMIC 1/2X4 (GAUZE/BANDAGES/DRESSINGS) ×2 IMPLANT
CNTNR URN SCR LID CUP LEK RST (MISCELLANEOUS) ×6 IMPLANT
COMP HUM SHLD RSA EQ +5 (Shoulder) ×2 IMPLANT
COMPONENT HUM SHLD RSA EQ +5 (Shoulder) IMPLANT
CONT SPEC 4OZ STRL OR WHT (MISCELLANEOUS) ×6
COOLER ICEMAN CLASSIC (MISCELLANEOUS) IMPLANT
COVER BACK TABLE 60X90IN (DRAPES) IMPLANT
COVER SURGICAL LIGHT HANDLE (MISCELLANEOUS) ×2 IMPLANT
DRAPE C-ARM 42X120 X-RAY (DRAPES) ×2 IMPLANT
DRAPE INCISE IOBAN 66X45 STRL (DRAPES) ×2 IMPLANT
DRAPE ORTHO SPLIT 77X108 STRL (DRAPES) ×4
DRAPE POUCH INSTRU U-SHP 10X18 (DRAPES) IMPLANT
DRAPE SHEET LG 3/4 BI-LAMINATE (DRAPES) ×2 IMPLANT
DRAPE SURG ORHT 6 SPLT 77X108 (DRAPES) ×4 IMPLANT
DRAPE TOP 10253 STERILE (DRAPES) ×2 IMPLANT
DRAPE U-SHAPE 47X51 STRL (DRAPES) ×2 IMPLANT
DRSG AQUACEL AG ADV 3.5X 4 (GAUZE/BANDAGES/DRESSINGS) ×4 IMPLANT
DRSG AQUACEL AG ADV 3.5X 6 (GAUZE/BANDAGES/DRESSINGS) ×2 IMPLANT
DRSG AQUACEL AG ADV 3.5X10 (GAUZE/BANDAGES/DRESSINGS) IMPLANT
DRSG MEPILEX POST OP 4X8 (GAUZE/BANDAGES/DRESSINGS) ×2 IMPLANT
DURAPREP 26ML APPLICATOR (WOUND CARE) ×4 IMPLANT
ELECT BLADE TIP CTD 4 INCH (ELECTRODE) ×2 IMPLANT
ELECT NDL TIP 2.8 STRL (NEEDLE) ×2 IMPLANT
ELECT NEEDLE TIP 2.8 STRL (NEEDLE) ×2 IMPLANT
ELECT REM PT RETURN 15FT ADLT (MISCELLANEOUS) ×2 IMPLANT
EVACUATOR 1/8 PVC DRAIN (DRAIN) IMPLANT
FACESHIELD WRAPAROUND (MASK) ×4 IMPLANT
FACESHIELD WRAPAROUND OR TEAM (MASK) ×4 IMPLANT
GAUZE PAD ABD 8X10 STRL (GAUZE/BANDAGES/DRESSINGS) ×2 IMPLANT
GAUZE SPONGE 4X4 12PLY STRL (GAUZE/BANDAGES/DRESSINGS) ×2 IMPLANT
GAUZE XEROFORM 1X8 LF (GAUZE/BANDAGES/DRESSINGS) IMPLANT
GLENOSPHERE RSA EQUINOXE W/CAP (Joint) IMPLANT
GLOVE BIO SURGEON STRL SZ 6.5 (GLOVE) ×4 IMPLANT
GLOVE BIO SURGEON STRL SZ8 (GLOVE) ×4 IMPLANT
GLOVE BIOGEL PI IND STRL 6.5 (GLOVE) ×2 IMPLANT
GLOVE BIOGEL PI IND STRL 8 (GLOVE) ×2 IMPLANT
GLOVE ECLIPSE 8.0 STRL XLNG CF (GLOVE) ×4 IMPLANT
GOWN STRL REUS W/ TWL LRG LVL3 (GOWN DISPOSABLE) ×2 IMPLANT
GOWN STRL REUS W/ TWL XL LVL3 (GOWN DISPOSABLE) ×2 IMPLANT
GOWN STRL REUS W/TWL LRG LVL3 (GOWN DISPOSABLE) ×2
GOWN STRL REUS W/TWL XL LVL3 (GOWN DISPOSABLE) ×2
HANDPIECE INTERPULSE COAX TIP (DISPOSABLE)
KIT BASIN OR (CUSTOM PROCEDURE TRAY) ×2 IMPLANT
KIT STABILIZATION SHOULDER (MISCELLANEOUS) ×2 IMPLANT
KIT TURNOVER KIT A (KITS) IMPLANT
LINER HUM SHLD CONST EQ 42 +0 (Liner) IMPLANT
MANIFOLD NEPTUNE II (INSTRUMENTS) ×2 IMPLANT
NDL 1/2 CIR CATGUT .05X1.09 (NEEDLE) ×2 IMPLANT
NEEDLE 1/2 CIR CATGUT .05X1.09 (NEEDLE) IMPLANT
NS IRRIG 1000ML POUR BTL (IV SOLUTION) ×2 IMPLANT
PACK SHOULDER (CUSTOM PROCEDURE TRAY) ×2 IMPLANT
PAD ARMBOARD 7.5X6 YLW CONV (MISCELLANEOUS) ×4 IMPLANT
PAD COLD SHLDR WRAP-ON (PAD) IMPLANT
RESTRAINT HEAD UNIVERSAL NS (MISCELLANEOUS) ×2 IMPLANT
RETRIEVER SUT HEWSON (MISCELLANEOUS) IMPLANT
SCREW LOCK GLENOID 15-05 (Screw) IMPLANT
SCREW SHLD RS EQUIN (Screw) IMPLANT
SET HNDPC FAN SPRY TIP SCT (DISPOSABLE) IMPLANT
SLEEVE SCD COMPRESS KNEE MED (STOCKING) IMPLANT
SLING ARM FOAM STRAP LRG (SOFTGOODS) ×2 IMPLANT
SLING ARM FOAM STRAP MED (SOFTGOODS) IMPLANT
SLING ARM FOAM STRAP XLG (SOFTGOODS) IMPLANT
SLING ARM IMMOBILIZER LRG (SOFTGOODS) IMPLANT
SLING ARM IMMOBILIZER MED (SOFTGOODS) IMPLANT
SMARTMIX MINI TOWER (MISCELLANEOUS)
SOLUTION PRONTOSAN WOUND 350ML (IRRIGATION / IRRIGATOR) ×2 IMPLANT
SPIKE FLUID TRANSFER (MISCELLANEOUS) IMPLANT
SPONGE T-LAP 4X18 ~~LOC~~+RFID (SPONGE) IMPLANT
STAPLER VISISTAT 35W (STAPLE) IMPLANT
STRIP CLOSURE SKIN 1/2X4 (GAUZE/BANDAGES/DRESSINGS) ×2 IMPLANT
SUPPORT WRAP ARM LG (MISCELLANEOUS) ×2 IMPLANT
SUT ETHIBOND 2 OS 4 DA (SUTURE) ×2 IMPLANT
SUT ETHIBOND 2 V 37 (SUTURE) ×2 IMPLANT
SUT ETHILON 3 0 PS 1 (SUTURE) IMPLANT
SUT FIBERWIRE #2 38 T-5 BLUE (SUTURE)
SUT MNCRL AB 4-0 PS2 18 (SUTURE) ×2 IMPLANT
SUT VIC AB 0 CT1 27 (SUTURE) ×2
SUT VIC AB 0 CT1 27XBRD ANBCTR (SUTURE) ×2 IMPLANT
SUT VIC AB 2-0 CT1 27 (SUTURE) ×2
SUT VIC AB 2-0 CT1 TAPERPNT 27 (SUTURE) ×2 IMPLANT
SUT VIC AB 2-0 SH 27 (SUTURE)
SUT VIC AB 2-0 SH 27XBRD (SUTURE) IMPLANT
SUT VIC AB 3-0 SH 27 (SUTURE) ×2
SUT VIC AB 3-0 SH 27X BRD (SUTURE) ×2 IMPLANT
SUTURE FIBERWR #2 38 T-5 BLUE (SUTURE) ×4 IMPLANT
TOWEL OR 17X26 10 PK STRL BLUE (TOWEL DISPOSABLE) ×2 IMPLANT
TOWEL OR NON WOVEN STRL DISP B (DISPOSABLE) ×2 IMPLANT
TOWER SMARTMIX MINI (MISCELLANEOUS) IMPLANT
TUBE SUCTION HIGH CAP CLEAR NV (SUCTIONS) ×2 IMPLANT
WATER STERILE IRR 1000ML POUR (IV SOLUTION) ×2 IMPLANT

## 2022-09-06 NOTE — Transfer of Care (Signed)
Immediate Anesthesia Transfer of Care Note  Patient: Kenneth Henson  Procedure(s) Performed: TOTAL SHOULDER REVISION (Right: Shoulder) MINOR HARDWARE REMOVAL (Right) OPEN REDUCTION INTERNAL FIXATION (ORIF) PROXIMAL HUMERUS FRACTURE (Right)  Patient Location: PACU  Anesthesia Type:GA combined with regional for post-op pain  Level of Consciousness: awake, alert , and oriented  Airway & Oxygen Therapy: Patient Spontanous Breathing and Patient connected to face mask oxygen  Post-op Assessment: Report given to RN and Post -op Vital signs reviewed and stable  Post vital signs: Reviewed and stable  Last Vitals:  Vitals Value Taken Time  BP 139/84   Temp    Pulse 74 09/06/22 1449  Resp 23 09/06/22 1449  SpO2 96 % 09/06/22 1449  Vitals shown include unvalidated device data.  Last Pain:  Vitals:   09/06/22 1225  TempSrc:   PainSc: 0-No pain      Patients Stated Pain Goal: 4 (09/06/22 1000)  Complications: No notable events documented.

## 2022-09-06 NOTE — Anesthesia Procedure Notes (Signed)
Procedure Name: Intubation Date/Time: 09/06/2022 1:04 PM  Performed by: Pearson Grippe, CRNAPre-anesthesia Checklist: Patient identified, Emergency Drugs available, Suction available and Patient being monitored Patient Re-evaluated:Patient Re-evaluated prior to induction Oxygen Delivery Method: Circle system utilized Preoxygenation: Pre-oxygenation with 100% oxygen Induction Type: IV induction Ventilation: Mask ventilation without difficulty and Oral airway inserted - appropriate to patient size Laryngoscope Size: Hyacinth Meeker and 2 Grade View: Grade I Tube type: Oral Tube size: 7.5 mm Number of attempts: 1 Airway Equipment and Method: Stylet and Oral airway Placement Confirmation: ETT inserted through vocal cords under direct vision, positive ETCO2 and breath sounds checked- equal and bilateral Secured at: 23 cm Tube secured with: Tape Dental Injury: Teeth and Oropharynx as per pre-operative assessment

## 2022-09-06 NOTE — Anesthesia Procedure Notes (Signed)
Anesthesia Regional Block: Interscalene brachial plexus block   Pre-Anesthetic Checklist: , timeout performed,  Correct Patient, Correct Site, Correct Laterality,  Correct Procedure, Correct Position, site marked,  Risks and benefits discussed,  Surgical consent,  Pre-op evaluation,  At surgeon's request and post-op pain management  Laterality: Right  Prep: chloraprep       Needles:  Injection technique: Single-shot  Needle Type: Echogenic Stimulator Needle     Needle Length: 9cm  Needle Gauge: 21     Additional Needles:   Procedures:,,,, ultrasound used (permanent image in chart),,    Narrative:  Start time: 09/06/2022 12:00 PM End time: 09/06/2022 12:10 PM Injection made incrementally with aspirations every 5 mL.  Performed by: Personally  Anesthesiologist: Leonides Grills, MD  Additional Notes: Functioning IV was confirmed and monitors were applied.  A timeout was performed. Sterile prep, hand hygiene and sterile gloves were used. A 90mm 21ga Arrow echogenic stimulator needle was used. Negative aspiration and negative test dose prior to incremental administration of local anesthetic. The patient tolerated the procedure well.  Ultrasound guidance: relevent anatomy identified, needle position confirmed, local anesthetic spread visualized around nerve(s), vascular puncture avoided.  Image printed for medical record.

## 2022-09-06 NOTE — Anesthesia Preprocedure Evaluation (Addendum)
Anesthesia Evaluation  Patient identified by MRN, date of birth, ID band Patient awake    Reviewed: Allergy & Precautions, NPO status , Patient's Chart, lab work & pertinent test results  Airway Mallampati: II  TM Distance: >3 FB Neck ROM: Full    Dental  (+) Edentulous Upper, Edentulous Lower   Pulmonary former smoker   Pulmonary exam normal        Cardiovascular hypertension, Pt. on medications Normal cardiovascular exam     Neuro/Psych  Headaches  negative psych ROS   GI/Hepatic negative GI ROS, Neg liver ROS,,,  Endo/Other  diabetes, Oral Hypoglycemic Agents    Renal/GU negative Renal ROS     Musculoskeletal  (+) Arthritis ,  Ambulates with a cane   Abdominal   Peds  Hematology negative hematology ROS (+)   Anesthesia Other Findings right shoulder dislocation and hardware failure  Reproductive/Obstetrics                             Anesthesia Physical Anesthesia Plan  ASA: 3  Anesthesia Plan: General and Regional   Post-op Pain Management: Regional block*   Induction: Intravenous  PONV Risk Score and Plan: 2 and Ondansetron, Dexamethasone and Treatment may vary due to age or medical condition  Airway Management Planned: Oral ETT  Additional Equipment:   Intra-op Plan:   Post-operative Plan: Extubation in OR  Informed Consent: I have reviewed the patients History and Physical, chart, labs and discussed the procedure including the risks, benefits and alternatives for the proposed anesthesia with the patient or authorized representative who has indicated his/her understanding and acceptance.     Dental advisory given  Plan Discussed with: CRNA  Anesthesia Plan Comments:        Anesthesia Quick Evaluation

## 2022-09-06 NOTE — Op Note (Signed)
Orthopaedic Surgery Operative Note (CSN: [id number])  Kenneth Henson  1940/11/16 Date of Surgery: 09/06/2022   Diagnoses:  Right implant failure and dislocation  Procedure: Right revision reverse Total Shoulder Arthroplasty Open Synovectomy Open treatment of shoulder dislocation   Operative Finding Successful completion of planned procedure.  Patient a fall that resulted in a dissociation of his polyethylene and his baseplate tray.  The tray itself appeared to be normal.  That is followed dislodge the polyethylene liner from the tray.  The poly had significant wear and there was some early metallosis from the tray rubbing on the glenosphere.    He was dislocated for 3 weeks.  It was a delay getting him to the operating room secondary to operating availability despite our best efforts.  His stem and baseplate appear to be well-fixed.  The there was some atypical appearing material below his glenosphere however there is no other sign of purulent material.  We were able to retain his baseplate as well as his stem.  The initial position of his baseplate was relatively low and we did not feel that in the setting of instability that moving it proximal would be a reasonable idea.  His deltoid appeared to be normal appearing however I was not able to perform axillary nerve tug test secondary to subcoracoid adhesions.  This rotator cuff was completely torn.  We are able to upsize the tray and humeral components in size and retain the same size glenosphere.  The final construct was quite stable.  Post-operative plan: The patient will be NWB in sling.  The patient will be will be admitted to observation due to medical complexity, monitoring and pain management.  DVT prophylaxis Lovenox 40 mg/day until mobilizing and then consider transition in clinic to alternative medicines.  Pain control with PRN pain medication preferring oral medicines.  Follow up plan will be scheduled in approximately 7 days for  incision check and XR.  Physical therapy to start after 4 weeks.  Implants: [employer] Equinox humeral tray adapter +5 mm, Equinox constrained humeral liner 42+0, Equinox glenosphere and locking 42 mm, retained Ex[employer] humeral stem and baseplate  Post-Op Diagnosis: Same Surgeons:Primary: Kenneth Pippin, MD Assistants:Kenneth McBane PA-C Location: [facility] ROOM 06 Anesthesia: General with Exparel Interscalene Antibiotics: Ancef 2g preop, Vancomycin  locally Tourniquet time: None Estimated Blood Loss: 100 Complications: None Specimens: 3 for culture hold for 2 weeks to rule out C acnes Implants: Implant Name Type Inv. Item Serial No. Manufacturer Lot No. LRB No. Used Action  SCREW LOCK GLENOID 15-05 - [id number] Screw SCREW LOCK GLENOID 15-05 [id number] [employer]  Right 1 Implanted  GLENOSPHERE RSA EQUINOXE W/CAP - [id number] Joint GLENOSPHERE RSA EQUINOXE W/CAP [id number] [employer]  Right 1 Implanted  HUMERAL APTER TRAY Orthopedic Implant  [id number] [facility]  Right 1 Implanted  SCREW Leonie Douglas - [id number] Screw SCREW Leonie Douglas [id number] [employer]  Right 1 Implanted  CONSTRAINED HUMERAL LINER Orthopedic Implant  [id number] [employer]  Right 1 Implanted    Indications for Surgery:   [name] is a 82 y.o. male with fall resulting in a dislocation of his right reverse shoulder arthroplasty presenting to my clinic 3 weeks after injury.  Benefits and risks of operative and nonoperative management were discussed prior to surgery with patient/guardian(s) and informed consent form was completed.  Infection and need for further surgery were discussed as was prosthetic stability and cuff issues.  We additionally specifically discussed risks of axillary nerve injury, infection, periprosthetic fracture, continued pain  and longevity of implants prior to beginning procedure.      Procedure:   The patient was identified in the preoperative holding area where the surgical site was marked. Block placed by  anesthesia with exparel.  The patient was taken to the OR where a procedural timeout was called and the above noted anesthesia was induced.  The patient was positioned beachchair on allen table with spider arm positioner.  Preoperative antibiotics were dosed.  The patient's right shoulder was prepped and draped in the usual sterile fashion.  A second preoperative timeout was called.       We did the patient's previous deltopectoral incision with her skin sharply treatment hemostasis was progressed.  We identified the Spectrocin roll and placement retractors and were able to identify and track through the previous dissection.  We placed blunt retractors at this point and noted there was essentially no subscapularis.  The anterior capsule remnant was peeled from the humerus staying directly on bone and progressively externally rotating to expose the implants and the humeral proximal segment.  We noted that the polyethylene had broken completely loose of the tray.  It was still somewhat articulating with the glenosphere.  We were able to withdraw it manually as it was completely loose.  This was then open treatment and dislocation as the dislocated components were then delivered so that we could remove the tray.    We checked the humeral component to ensure there is well-fixed and appeared to be.  We performed a synovectomy of the entire joint.  We took multiple specimens and biopsies for culture.  We remove the 42 standard glenosphere and noted some unhealthy/atypical appearing material below the glenosphere however the baseplate appear to be well-fixed.  We checked the tightness of all of the locking caps and then irrigated with 3 L of normal saline.  At this point placed Prontosan solution by technique as instructed.  We irrigated again and placed our 42 glenosphere.  We went to the tray and selected multiple trial options of the left settling on a +5 tray with a 0 retentive polyethylene.  We placed our final  implants and used a #5 FiberWire to repair the anterior capsular tissue to the anterior portion of the humerus.  We irrigated again placed local vancomycin powder and closed incision in multiple fashion finishing with nylon suture.   Alfonse Alpers, PA-C, present and scrubbed throughout the case, critical for completion in a timely fashion, and for retraction, instrumentation, closure.

## 2022-09-06 NOTE — Interval H&P Note (Signed)
I discussed the case with the patient as well as his daughter Kenneth Henson.  Went through the extensive risks including but not limited to periprosthetic fracture continued stability neurovascular damage amongst others.  This is a chronic dislocation the patient has multiple risk factors medically that make his case quite difficult.  We will keep him overnight and we will plan to discharge back home.  [facility]  The daughter and the patient expressed understanding.  Will proceed as above.

## 2022-09-07 LAB — CBC
HCT: 40.8 % (ref 39.0–52.0)
Hemoglobin: 13.6 g/dL (ref 13.0–17.0)
MCH: 29.7 pg (ref 26.0–34.0)
MCHC: 33.3 g/dL (ref 30.0–36.0)
MCV: 89.1 fL (ref 80.0–100.0)
Platelets: 309 10*3/uL (ref 150–400)
RBC: 4.58 MIL/uL (ref 4.22–5.81)
RDW: 13.2 % (ref 11.5–15.5)
WBC: 15.7 10*3/uL — ABNORMAL HIGH (ref 4.0–10.5)
nRBC: 0 % (ref 0.0–0.2)

## 2022-09-07 LAB — AEROBIC/ANAEROBIC CULTURE W GRAM STAIN (SURGICAL/DEEP WOUND): Culture: NO GROWTH

## 2022-09-07 MED ORDER — AMOXICILLIN 500 MG PO TABS: 500.0000 mg | ORAL_TABLET | Freq: Two times a day (BID) | ORAL | 0 refills | Status: AC

## 2022-09-07 MED ORDER — OMEPRAZOLE 20 MG PO CPDR: 20.0000 mg | DELAYED_RELEASE_CAPSULE | Freq: Every day | ORAL | 0 refills | Status: AC

## 2022-09-07 MED ORDER — CELECOXIB 100 MG PO CAPS: 100.0000 mg | ORAL_CAPSULE | Freq: Two times a day (BID) | ORAL | 0 refills | Status: AC

## 2022-09-07 MED ORDER — ACETAMINOPHEN 500 MG PO TABS: 500.0000 mg | ORAL_TABLET | Freq: Three times a day (TID) | ORAL | 0 refills | Status: AC

## 2022-09-07 MED ORDER — ONDANSETRON HCL 4 MG PO TABS: 4.0000 mg | ORAL_TABLET | Freq: Three times a day (TID) | ORAL | 0 refills | Status: AC | PRN

## 2022-09-07 MED ORDER — HYDROCODONE-ACETAMINOPHEN 5-325 MG PO TABS: 1.0000 | ORAL_TABLET | Freq: Four times a day (QID) | ORAL | 0 refills | Status: DC | PRN

## 2022-09-07 MED ORDER — ASPIRIN 81 MG PO CHEW: 81.0000 mg | CHEWABLE_TABLET | Freq: Two times a day (BID) | ORAL | 0 refills | Status: AC

## 2022-09-07 NOTE — Evaluation (Signed)
Occupational Therapy Evaluation Patient Details Name: Kenneth Henson MRN: [id number] DOB: [birth year] Today's Date: 09/07/2022   History of Present Illness Pt is an 82 y.o. male s/p R total shoulder revision, minor hardware removal, and ORIF proximal humerus fracture. PMH significant for HLD, HTN, DM2.   Clinical Impression   PTA, pt recently at [facility] rehab where he reports he was performing transfers with light assist and receiving assist with ADL. Upon eval, pt unsure of weight bearing status on LLE, so focus session on shoulder education. All education provided for ROM/weight bearing restrictions for RUE, use of compensatory techniques for ADL as well as application of brightly colored theraband to sling to optimize pt independence with donning himself. Pt reports he has support at home, so likely can go home with [facility] with stipulation that caregiver can provide needed assist.      Recommendations for follow up therapy are one component of a multi-disciplinary discharge planning process, led by the attending physician.  Recommendations may be updated based on patient status, additional functional criteria and insurance authorization.   Assistance Recommended at Discharge Frequent or constant Supervision/Assistance  Patient can return home with the following A little help with walking and/or transfers;A lot of help with bathing/dressing/bathroom;Assistance with cooking/housework;Assist for transportation;Help with stairs or ramp for entrance    Functional Status Assessment  Patient has had a recent decline in their functional status and demonstrates the ability to make significant improvements in function in a reasonable and predictable amount of time.  Equipment Recommendations  BSC/3in1    Recommendations for Other Services       Precautions / Restrictions Precautions Precautions: Fall;Shoulder Type of Shoulder Precautions: No ROM at shoulder; ok for AROM elbow/wrist/hand. NWB, sling at  all times except ADL/exercise Precaution Comments: CAM boot on L Required Braces or Orthoses: Knee Immobilizer - Left;Other Brace Knee Immobilizer - Left: On at all times Other Brace: CAM boot Restrictions Weight Bearing Restrictions: Yes LUE Weight Bearing: Non weight bearing LLE Weight Bearing: Weight bearing as tolerated      Mobility Bed Mobility Overal bed mobility: Needs Assistance Bed Mobility: Supine to Sit     Supine to sit: Mod assist     General bed mobility comments: Assist with LLE, cues for sequencing and truncal elevation as well as weight shifting to scoot out toward EOB.    Transfers Overall transfer level: Needs assistance Equipment used: Quad cane Transfers: Sit to/from Stand, Bed to chair/wheelchair/BSC Sit to Stand: Min assist, From elevated surface     Step pivot transfers: Min guard, Min assist     General transfer comment: STS x2 and requiring cues and external asisst on both attempts.      Balance Overall balance assessment: Needs assistance, History of Falls Sitting-balance support: Feet supported, No upper extremity supported, Single extremity supported Sitting balance-Leahy Scale: Fair     Standing balance support: During functional activity, Reliant on assistive device for balance, Single extremity supported Standing balance-Leahy Scale: Poor                             ADL either performed or assessed with clinical judgement   ADL Overall ADL's : Needs assistance/impaired Eating/Feeding: Set up;Sitting   Grooming: Set up;Sitting   Upper Body Bathing: Sitting;Supervision/ safety Upper Body Bathing Details (indicate cue type and reason): for sling application and donning shirt. Applying bright yellow theraband to hooks that receive shoulder strap on sling due to pt's low  vision and difficulty donning due to inability to see separate parts of sling without incresed contrast Lower Body Bathing: Moderate assistance;Sit  to/from stand   Upper Body Dressing : Moderate assistance Upper Body Dressing Details (indicate cue type and reason): for sling application and donning shirt. Applying bright yellow theraband to hooks that receive shoulder strap on sling due to pt's low vision and difficulty donning due to inability to see separate parts of sling without incresed contrast Lower Body Dressing: Moderate assistance;Sit to/from stand;Maximal assistance Lower Body Dressing Details (indicate cue type and reason): mod A for LB ADL; max A for donning KI and CAM boot Toilet Transfer: Minimal assistance;Stand-pivot;Ambulation Toilet Transfer Details (indicate cue type and reason): cane. Increased time and cues required       Tub/Shower Transfer Details (indicate cue type and reason): Recommended pt wait for tub transfer until home healtyh services are able to come into his home. 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 Pt requires external cueing not to perform ROM at shoulder with transfers and ADL   Lower Extremity Assessment Lower Extremity Assessment: LLE deficits/detail LLE Deficits / Details: in KI and Cam boot, able to lift leg from bed. COmpensatory  techniques for gait. Requires cueing for transfers to manage LLE   Cervical / Trunk Assessment Cervical / Trunk Assessment: Normal   Communication Communication Communication: No difficulties   Cognition Arousal/Alertness: Awake/alert Behavior During Therapy: WFL for tasks assessed/performed Overall Cognitive Status: Within Functional Limits for tasks assessed                                 General Comments: Some decreased problem solving in relation to going home. Plans to go home, but unsure if people intended to stay with him can provide amount of support needed     General Comments  VSS    Exercises     Shoulder Instructions      Home Living Family/patient expects to be discharged to:: Private residence Living Arrangements: Alone Available Help at Discharge: Family;Available 24 hours/day;Available PRN/intermittently (girlfriend to stay until sunday and then friend to stay 24/7 for ~1 month or more based on pt need) Type of Home: Apartment Home Access: Level entry     Home Layout: One level     Bathroom Shower/Tub: Producer, television/film/video: Standard     Home Equipment: Rollator (4 wheels);Cane - single point;Shower seat;Grab bars - tub/shower (Hemi walker, reclining lift chair)          Prior Functioning/Environment Prior Level of Function : Needs assist             Mobility Comments: Recently at rehabilitation where he reports he was receiving assist with transfers ADLs Comments:  REcently at rehab where he reports he was having assist with ADL. ALso legally blind so use of compensatory techniques at baseline for ADL and IADL        OT Problem List: Decreased strength;Decreased range of motion;Decreased activity tolerance;Impaired balance (sitting and/or standing);Decreased safety awareness;Decreased knowledge of use of DME or AE;Decreased knowledge of precautions;Impaired UE functional use      OT Treatment/Interventions:  Self-care/ADL training;Therapeutic exercise;DME and/or AE instruction;Balance training;Patient/family education;Therapeutic activities    OT Goals(Current goals can be found in the care plan section) Acute Rehab OT Goals Patient Stated Goal: go home OT Goal Formulation: With patient Time For Goal Achievement: 09/21/22 Potential to Achieve Goals: Good  OT Frequency: Min 3X/week    Co-evaluation              AM-PAC OT "6 Clicks" Daily Activity     Outcome Measure Help from another person eating meals?: A Little Help from another person taking care of personal grooming?: A Little Help from another person toileting, which includes using toliet, bedpan, or urinal?: A Little Help from another person bathing (including washing, rinsing, drying)?: A Lot Help from another person to put on and taking off regular upper body clothing?: A Lot Help from another person to put on and taking off regular lower body clothing?: A Lot 6 Click Score: 15   End of Session Equipment Utilized During Treatment:  (R shoulder sling) Nurse Communication: Mobility status (to follow up once weightbearing status confirmed)  Activity Tolerance: Patient tolerated treatment well Patient left: in bed;with call bell/phone within reach  OT Visit Diagnosis: Unsteadiness on feet (R26.81);Muscle weakness (generalized) (M62.81);Other abnormalities of gait and mobility (R26.89);History of falling (Z91.81)                Time: 1610-9604 OT Time Calculation (min): 37 min Charges:  OT General Charges $OT Visit: 1 Visit OT Evaluation $OT Eval Low Complexity: 1 Low OT Treatments $Self Care/Home Management : 8-22 mins  Tyler Deis, OTR/L [facility] Acute Rehabilitation Office: [phone]   Myrla Halsted 09/07/2022, 12:50 PM

## 2022-09-07 NOTE — Discharge Instructions (Signed)
Ramond Marrow MD, MPH Alfonse Alpers, PA-C [facility] [address], [address] [phone] (tel)   [phone] (fax)   POST-OPERATIVE INSTRUCTIONS - TOTAL SHOULDER REPLACEMENT    WOUND CARE You may leave the operative dressing in place until your follow-up appointment. KEEP THE INCISIONS CLEAN AND DRY. There may be a small amount of fluid/bleeding leaking at the surgical site. This is normal after surgery.  If it fills with liquid or blood please call us immediately to change it for you. Use the provided ice machine or Ice packs as often as possible for the first 3-4 days, then as needed for pain relief.   Keep a layer of cloth or a shirt between your skin and the cooling unit to prevent frost bite as it can get very cold.  SHOWERING: - You may shower on Post-Op Day #2.  - The dressing is water resistant but do not scrub it as it may start to peel up.   - You may remove the sling for showering - Gently pat the area dry.  - Do not soak the shoulder in water.  - Do not go swimming in the pool or ocean until your incision has completely healed (about 4-6 weeks after surgery) - KEEP THE INCISIONS CLEAN AND DRY.  EXERCISES Wear the sling at all times  You may remove the sling for showering, but keep the arm across the chest or in a secondary sling.    Accidental/Purposeful External Rotation and shoulder flexion (reaching behind you) is to be avoided at all costs for the first month. It is ok to come out of your sling if your are sitting and have assistance for eating.   Do not lift anything heavier than 1 pound until we discuss it further in clinic.  It is normal for your fingers/hand to become more swollen after surgery and discolored from bruising.   This will resolve over the first few weeks usually after surgery. Please continue to ambulate and do not stay sitting or lying for too long.  Perform foot and wrist pumps to assist in circulation.  PHYSICAL  THERAPY - No physical therapy for 4 weeks after surgery  REGIONAL ANESTHESIA (NERVE BLOCKS) The anesthesia team may have performed a nerve block for you this is a great tool used to minimize pain.   The block may start wearing off overnight (between 8-24 hours postop) When the block wears off, your pain may go from nearly zero to the pain you would have had postop without the block. This is an abrupt transition but nothing dangerous is happening.   This can be a challenging period but utilize your as needed pain medications to try and manage this period. We suggest you use the pain medication the first night prior to going to bed, to ease this transition.  You may take an extra dose of narcotic when this happens if needed   POST-OP MEDICATIONS- Multimodal approach to pain control In general your pain will be controlled with a combination of substances.  Prescriptions unless otherwise discussed are electronically sent to your pharmacy.  This is a carefully made plan we use to minimize narcotic use.     Celebrex - Anti-inflammatory medication taken on a scheduled basis Acetaminophen - Non-narcotic pain medicine taken on a scheduled basis  Hydrocodone-acetaminophen (Norco) - This is a strong narcotic, to be used only on an "as needed" basis for SEVERE pain. Do not take more than 3,500 mg of acetaminophen(tylenol) daily Aspirin  -  This medicine is used to minimize the risk of blood clots after surgery. Omeprazole - daily medicine to protect your stomach while taking anti-inflammatories.  This may only be used in higher risk patients. Zofran -  take as needed for nausea Amoxicillin - this is an antibiotic, take as instructed  FOLLOW-UP If you develop a Fever (>101.5), Redness or Drainage from the surgical incision site, please call our office to arrange for an evaluation. Our office will call you to reschedule your first post-op appointment  May 7th at 10:30 am  IF YOU HAVE ANY  QUESTIONS, PLEASE FEEL FREE TO CALL OUR OFFICE.  HELPFUL INFORMATION  Your arm will be in a sling following surgery. You will be in this sling for the next 4 weeks.   You may be more comfortable sleeping in a semi-seated position the first few nights following surgery.  Keep a pillow propped under the elbow and forearm for comfort.  If you have a recliner type of chair it might be beneficial.  If not that is fine too, but it would be helpful to sleep propped up with pillows behind your operated shoulder as well under your elbow and forearm.  This will reduce pulling on the suture lines.  When dressing, put your operative arm in the sleeve first.  When getting undressed, take your operative arm out last.  Loose fitting, button-down shirts are recommended.  In most states it is against the law to drive while your arm is in a sling. And certainly against the law to drive while taking narcotics.  You may return to work/school in the next couple of days when you feel up to it. Desk work and typing in the sling is fine.  We suggest you use the pain medication the first night prior to going to bed, in order to ease any pain when the anesthesia wears off. You should avoid taking pain medications on an empty stomach as it will make you nauseous.  You should wean off your narcotic medicines as soon as you are able.     Most patients will be off narcotics before their first postop appointment.   Do not drink alcoholic beverages or take illicit drugs when taking pain medications.  Pain medication may make you constipated.  Below are a few solutions to try in this order: Decrease the amount of pain medication if you aren't having pain. Drink lots of decaffeinated fluids. Drink prune juice and/or each dried prunes  If the first 3 don't work start with additional solutions Take Colace - an over-the-counter stool softener Take Senokot - an over-the-counter laxative Take Miralax - a stronger  over-the-counter laxative   Dental Antibiotics:  In most cases prophylactic antibiotics for Dental procdeures after total joint surgery are not necessary.  Exceptions are as follows:  1. History of prior total joint infection  2. Severely immunocompromised (Organ Transplant, cancer chemotherapy, Rheumatoid biologic meds such as Humera)  3. Poorly controlled diabetes (A1C &gt; 8.0, blood glucose over 200)  If you have one of these conditions, contact your surgeon for an antibiotic prescription, prior to your dental procedure.   For more information including helpful videos and documents visit our website:   [email or web address]

## 2022-09-07 NOTE — Progress Notes (Addendum)
Occupational Therapy Treatment Patient Details Name: Kenneth Henson MRN: [id number] DOB: April 13, 1941 Today's Date: 09/07/2022   History of present illness Pt is an 82 y.o. male s/p R total shoulder revision, minor hardware removal, and ORIF proximal humerus fracture. PMH significant for HLD, HTN, DM2.   OT comments  Pt seen second time today after confirmation of WBAT for LLE. Performing UB ADL and LB ADL with up to mod A. Pt able to perform self feeding and grooming tasks with set-up A. Performing STS with min A and cueing for hand/LLE placement. Pt reporting girlfriend can stay until Sunday and then friend coming to stay as long as needed. Pt seems fairly confident with ability of friend to physically assist but seems unsure about girlfriend ability to provide any physical assist. Will attempt to contact daughter to navigate arrangement of additional assist. If pt can arrange someone who can confirm ability to provide light assist for transfers as needed at home, may go home with therapy in natural setting. Otherwise may benefit from some form of inpatient rehabilitation.    Recommendations for follow up therapy are one component of a multi-disciplinary discharge planning process, led by the attending physician.  Recommendations may be updated based on patient status, additional functional criteria and insurance authorization.    Assistance Recommended at Discharge Frequent or constant Supervision/Assistance  Patient can return home with the following  A little help with walking and/or transfers;A lot of help with bathing/dressing/bathroom;Assistance with cooking/housework;Assist for transportation;Help with stairs or ramp for entrance   Equipment Recommendations  BSC/3in1    Recommendations for Other Services      Precautions / Restrictions Precautions Precautions: Fall;Shoulder Type of Shoulder Precautions: No ROM at shoulder; ok for AROM elbow/wrist/hand. NWB, sling at all times except  ADL/exercise Precaution Comments: CAM boot on L Required Braces or Orthoses: Knee Immobilizer - Left;Other Brace Knee Immobilizer - Left: On at all times Other Brace: CAM boot Restrictions Weight Bearing Restrictions: Yes LUE Weight Bearing: Non weight bearing LLE Weight Bearing: Weight bearing as tolerated       Mobility Bed Mobility Overal bed mobility: Needs Assistance Bed Mobility: Supine to Sit     Supine to sit: Mod assist     General bed mobility comments: Assist with LLE, cues for sequencing and truncal elevation as well as weight shifting to scoot out toward EOB.    Transfers Overall transfer level: Needs assistance Equipment used: Quad cane Transfers: Sit to/from Stand, Bed to chair/wheelchair/BSC Sit to Stand: Min assist, From elevated surface     Step pivot transfers: Min guard, Min assist     General transfer comment: STS x2 and requiring cues and external asisst on both attempts.     Balance Overall balance assessment: Needs assistance, History of Falls Sitting-balance support: Feet supported, No upper extremity supported, Single extremity supported Sitting balance-Leahy Scale: Fair     Standing balance support: During functional activity, Reliant on assistive device for balance, Single extremity supported Standing balance-Leahy Scale: Poor                             ADL either performed or assessed with clinical judgement   ADL Overall ADL's : Needs assistance/impaired Eating/Feeding: Set up;Sitting   Grooming: Set up;Sitting   Upper Body Bathing: Sitting;Supervision/ safety Upper Body Bathing Details (indicate cue type and reason): for sling application and donning shirt. Applying bright yellow theraband to hooks that receive shoulder strap on sling due  to pt's low vision and difficulty donning due to inability to see separate parts of sling without incresed contrast Lower Body Bathing: Moderate assistance;Sit to/from stand   Upper  Body Dressing : Moderate assistance Upper Body Dressing Details (indicate cue type and reason): for sling application and donning shirt. Applying bright yellow theraband to hooks that receive shoulder strap on sling due to pt's low vision and difficulty donning due to inability to see separate parts of sling without incresed contrast Lower Body Dressing: Moderate assistance;Sit to/from stand;Maximal assistance Lower Body Dressing Details (indicate cue type and reason): mod A for LB ADL; max A for donning KI and CAM boot Toilet Transfer: Minimal assistance;Stand-pivot;Ambulation Toilet Transfer Details (indicate cue type and reason): cane. Increased time and cues required       Tub/Shower Transfer Details (indicate cue type and reason): Recommended pt wait for tub transfer until home healtyh services are able to come into his home. He was unsure if he had shower seat or tub benchy Functional mobility during ADLs: Min guard;Minimal assistance      Extremity/Trunk Assessment Upper Extremity Assessment Upper Extremity Assessment: RUE deficits/detail RUE Deficits / Details: AROM WFL at elbow/wrist/hand. Pt requires external cueing not to perform ROM at shoulder with transfers and ADL   Lower Extremity Assessment Lower Extremity Assessment: LLE deficits/detail LLE Deficits / Details: in KI and Cam boot, able to lift leg from bed. COmpensatory techniques for gait. 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 Plans to go home, but unsure if people intended to stay with him can provide amount of support needed        Exercises      Shoulder Instructions       General Comments VSS    Pertinent Vitals/ Pain       Pain Assessment Pain Assessment: Faces Faces Pain Scale: Hurts a little bit Pain Location: L knee and R shoulder Pain Descriptors / Indicators: Grimacing Pain Intervention(s): Limited activity within patient's tolerance, Monitored during session  Home Living Family/patient expects to be discharged to:: Private residence Living Arrangements: Alone Available Help at Discharge: Family;Available 24 hours/day;Available PRN/intermittently (girlfriend to stay until sunday and then friend to stay 24/7 for ~1 month or more based on pt need) Type of Home: Apartment Home Access: Level entry     Home Layout: One level     Bathroom Shower/Tub: Producer, television/film/video: Standard     Home Equipment: Rollator (4 wheels);Cane - single point;Shower seat;Grab bars - tub/shower (Hemi walker, reclining lift chair)          Prior Functioning/Environment              Frequency  Min 3X/week        Progress Toward Goals  OT Goals(current goals can now be found in the care  plan section)     Acute Rehab OT Goals Patient Stated Goal: go home OT Goal Formulation: With patient Time For Goal Achievement: 09/21/22 Potential to Achieve Goals: Good  Plan      Co-evaluation                 AM-PAC OT "6 Clicks" Daily Activity     Outcome Measure   Help from another person eating meals?: A Little Help from another person taking care of personal grooming?: A Little Help from another person toileting, which includes using toliet, bedpan, or urinal?: A Little Help from another person bathing (including washing, rinsing, drying)?: A Lot Help from another person to put  on and taking off regular upper body clothing?: A Lot Help from another person to put on and taking off regular lower body clothing?: A Lot 6 Click Score: 15    End of Session Equipment Utilized During Treatment: Gait belt;Left knee immobilizer;Other (comment) (L CAM boot, R shoulder sling)  OT Visit Diagnosis: Unsteadiness on feet (R26.81);Muscle weakness (generalized) (M62.81);Other abnormalities of gait and mobility (R26.89);History of falling (Z91.81)   Activity Tolerance Patient tolerated treatment well   Patient Left in chair;with call bell/phone within reach;with nursing/sitter in room   Nurse Communication Mobility status        Time: 1040-1130 OT Time Calculation (min): 50 min  Charges: OT General Charges $OT Visit: 1 Visit OT Treatments $Self Care/Home Management : 38-52 mins  Tyler Deis, OTR/L [facility] Acute Rehabilitation Office: [phone]   Myrla Halsted 09/07/2022, 12:43 PM

## 2022-09-07 NOTE — Anesthesia Postprocedure Evaluation (Signed)
Anesthesia Post Note  Patient: Kenneth Henson  Procedure(s) Performed: TOTAL SHOULDER REVISION (Right: Shoulder) MINOR HARDWARE REMOVAL (Right) OPEN REDUCTION INTERNAL FIXATION (ORIF) PROXIMAL HUMERUS FRACTURE (Right)     Patient location during evaluation: PACU Anesthesia Type: Regional and General Level of consciousness: awake Pain management: pain level controlled Vital Signs Assessment: post-procedure vital signs reviewed and stable Respiratory status: spontaneous breathing, nonlabored ventilation and respiratory function stable Cardiovascular status: blood pressure returned to baseline and stable Postop Assessment: no apparent nausea or vomiting Anesthetic complications: no   No notable events documented.  Last Vitals:  Vitals:   09/07/22 1026 09/07/22 1332  BP:  102/61  Pulse:  73  Resp:  18  Temp: 36.6 C 36.6 C  SpO2:  91%    Last Pain:  Vitals:   09/07/22 1332  TempSrc: Oral  PainSc:    Pain Goal: Patients Stated Pain Goal: 3 (09/07/22 1212)                 [name]

## 2022-09-07 NOTE — Plan of Care (Signed)

## 2022-09-07 NOTE — Progress Notes (Signed)
   ORTHOPAEDIC PROGRESS NOTE  s/p Procedure(s): TOTAL SHOULDER REVISION MINOR HARDWARE REMOVAL OPEN REDUCTION INTERNAL FIXATION (ORIF) PROXIMAL HUMERUS FRACTURE  SUBJECTIVE: Reports mild pain about operative site. Per his daughter, they have home health set up for the patient. Patient is not sure if it has been officially set up or not.   No chest pain. No SOB. No nausea/vomiting. No other complaints.  OBJECTIVE: PE: General: sitting up in hospital bed, NAD Cardiac: regular rate Pulmonary: no increased work of breathing RUE: Dressing CDI and sling well fitting,  full and painless ROM throughout hand with DPC of 0.  Axillary nerve sensation/motor altered in setting of block and unable to be fully tested.  Distal motor and sensory altered in setting of block.   Vitals:   09/07/22 0301 09/07/22 0615  BP: (!) 152/80 125/81  Pulse: 83 70  Resp: 15 15  Temp: (!) 97.5 F (36.4 C) (!) 97.5 F (36.4 C)  SpO2: 94% 95%   Stable post-op images.   ASSESSMENT: Kenneth Henson is a 82 y.o. male doing well postoperatively. POD#1  PLAN: Weightbearing: NWB RUE Insicional and dressing care: Reinforce dressings as needed Orthopedic device(s):  Sling Showering: post-op day #2 with assistance VTE prophylaxis: Aspirin BID Pain control: PRN pain medications, minimize narcotics as able Follow - up plan: 2 weeks in office Dispo: OT eval today. Plan for home health. Have placed TOC order to help ensure this has been arranged for the patient. Likely discharge home today.  Contact information:  Weekdays 8-5 Dr. Ramond Marrow, Alfonse Alpers PA-C, After hours and holidays please check [email or web address] for group call information for [facility]  Alfonse Alpers, PA-C 09/07/2022

## 2022-09-07 NOTE — Discharge Summary (Signed)
Patient ID: Kenneth Henson MRN: [id number] DOB/AGE: [birth year] 82 y.o.  Admit date: 09/06/2022 Discharge date: 09/07/2022  Admission Diagnoses: Right implant failure and dislocation   Discharge Diagnoses:  Principal Problem:   Status post reverse total arthroplasty of right shoulder   Past Medical History:  Diagnosis Date   Abnormal involuntary movements(781.0)    Arthritis    Chicken pox    Claudication (HCC)    Colon polyps    Diabetes mellitus type II    Erectile dysfunction    Hyperlipidemia    Hypertension    Neoplasm of skin    uncertain behavior     Procedures Performed:  Right revision reverse Total Shoulder Arthroplasty Open Synovectomy Open treatment of shoulder dislocation  On 09/06/2022  Discharged Condition: stable  Hospital Course: Patient brought in for scheduled procedure.  Tolerated procedure well.  Was kept for monitoring overnight for pain control and medical monitoring postop. He was found to be stable for DC home the morning after surgery.  Patient was instructed on specific activity restrictions and all questions were answered.  Consults: PT/OT  Significant Diagnostic Studies: No additional pertinent studies  Treatments: Surgery  Discharge Exam: General: sitting up in hospital bed, NAD Cardiac: regular rate Pulmonary: no increased work of breathing RUE: Dressing CDI and sling well fitting,  full and painless ROM throughout hand with DPC of 0.  Axillary nerve sensation/motor altered in setting of block and unable to be fully tested.  Distal motor and sensory altered in setting of block.  Disposition: Discharge disposition: 06-Home-Health Care Svc       Discharge Instructions     Call MD for:  redness, tenderness, or signs of infection (pain, swelling, redness, odor or green/yellow discharge around incision site)   Complete by: As directed    Call MD for:  severe uncontrolled pain   Complete by: As directed    Call MD for:   temperature >100.4   Complete by: As directed    Diet - low sodium heart healthy   Complete by: As directed       Allergies as of 09/07/2022       Reactions   Doxycycline Nausea And Vomiting   Oxycodone-acetaminophen Itching        Medication List     STOP taking these medications    ibuprofen 200 MG tablet Commonly known as: ADVIL       TAKE these medications    acetaminophen 500 MG tablet Commonly known as: TYLENOL Take 1 tablet (500 mg total) by mouth every 8 (eight) hours for 14 days. What changed:  how much to take when to take this reasons to take this   albuterol 108 (90 Base) MCG/ACT inhaler Commonly known as: VENTOLIN HFA Inhale 2 puffs into the lungs every 6 (six) hours as needed for wheezing or shortness of breath.   allopurinol 300 MG tablet Commonly known as: ZYLOPRIM Take 300 mg by mouth every 12 (twelve) hours as needed (gout).   amLODipine 10 MG tablet Commonly known as: NORVASC Take 1 tablet (10 mg total) by mouth daily.   amoxicillin 500 MG tablet Commonly known as: AMOXIL Take 1 tablet (500 mg total) by mouth 2 (two) times daily for 14 days.   aspirin 81 MG chewable tablet Commonly known as: Aspirin Childrens Chew 1 tablet (81 mg total) by mouth 2 (two) times daily. For 6 weeks for DVT prophylaxis after surgery   celecoxib 100 MG capsule Commonly known as: CeleBREX Take 1  capsule (100 mg total) by mouth 2 (two) times daily. For 2 weeks. Then take as needed   diclofenac Sodium 1 % Gel Commonly known as: Voltaren Apply 2 g topically 4 (four) times daily.   gabapentin 300 MG capsule Commonly known as: NEURONTIN Take 1 tab before bed x 2 weeks.  May increase to 1 tab at dinner for 2 weeks and then may increase to am, dinner and before bed thereafter for pain. What changed:  how much to take how to take this when to take this additional instructions   HYDROcodone-acetaminophen 5-325 MG tablet Commonly known as: Norco Take 1-2  tablets by mouth every 6 (six) hours as needed for severe pain. What changed:  how much to take when to take this reasons to take this Another medication with the same name was removed. Continue taking this medication, and follow the directions you see here.   Linzess 145 MCG Caps capsule Generic drug: linaclotide Take 145 mcg by mouth as needed (constipation).   lisinopril 40 MG tablet Commonly known as: ZESTRIL Take 1 tablet (40 mg total) by mouth daily.   metFORMIN 850 MG tablet Commonly known as: GLUCOPHAGE Take 1 tablet (850 mg total) by mouth daily.   mirtazapine 15 MG tablet Commonly known as: REMERON Take 15 mg by mouth at bedtime.   omeprazole 20 MG capsule Commonly known as: PRILOSEC Take 1 capsule (20 mg total) by mouth daily. To gastric protection while taking NSAIDs   ondansetron 4 MG tablet Commonly known as: Zofran Take 1 tablet (4 mg total) by mouth every 8 (eight) hours as needed for up to 7 days for nausea or vomiting. What changed:  when to take this reasons to take this   PRESERVISION AREDS PO Take 1 tablet by mouth 2 (two) times daily.   sildenafil 100 MG tablet Commonly known as: Viagra Take 1 tablet (100 mg total) by mouth as needed for erectile dysfunction.   simvastatin 20 MG tablet Commonly known as: ZOCOR Take 1 tablet (20 mg total) by mouth daily.   temazepam 15 MG capsule Commonly known as: RESTORIL Take 15 mg by mouth at bedtime.               Durable Medical Equipment  (From admission, onward)           Start     Ordered   09/07/22 1313  For home use only DME Bedside commode  Once       Question:  Patient needs a bedside commode to treat with the following condition  Answer:  Status post reverse total arthroplasty of right shoulder   09/07/22 1312            Follow-up Information     [facility] Follow up.   Specialty: Home Health Services Why: to provide home physical and occupational therapy  visits Contact information: [address] [place] [address] [phone]                 Alfonse Alpers, PA-C 09/07/2022

## 2022-09-07 NOTE — Evaluation (Signed)
Physical Therapy Evaluation Patient Details Name: Kenneth Henson MRN: [id number] DOB: Jul 16, 1940 Today's Date: 09/07/2022  History of Present Illness  Pt is an 82 y.o. male s/p R total shoulder revision, minor hardware removal, and ORIF proximal humerus fracture. PMH significant for HLD, HTN, DM2.  Clinical Impression  Pt admitted as above and presenting with functional mobility limitations 2* non-use of R UE, limited use of L LE with KI and Cam boot in place, balance deficits, and legal blindness.  This date, pt up to ambulate 33' into hall with min assist and hemi-walker.  Pt plans dc home with 24/7 assist and follow up HHPT and OT.     Recommendations for follow up therapy are one component of a multi-disciplinary discharge planning process, led by the attending physician.  Recommendations may be updated based on patient status, additional functional criteria and insurance authorization.  Follow Up Recommendations Can patient physically be transported by private vehicle: No     Assistance Recommended at Discharge Intermittent Supervision/Assistance  Patient can return home with the following       Equipment Recommendations None recommended by PT  Recommendations for Other Services       Functional Status Assessment Patient has had a recent decline in their functional status and demonstrates the ability to make significant improvements in function in a reasonable and predictable amount of time.     Precautions / Restrictions Precautions Precautions: Fall;Shoulder Type of Shoulder Precautions: No ROM at shoulder; ok for AROM elbow/wrist/hand. NWB, sling at all times except ADL/exercise Precaution Comments: CAM boot and KI on L Required Braces or Orthoses: Knee Immobilizer - Left;Other Brace Knee Immobilizer - Left: On at all times Other Brace: CAM boot Restrictions Weight Bearing Restrictions: Yes LUE Weight Bearing: Non weight bearing LLE Weight Bearing: Weight bearing as  tolerated Other Position/Activity Restrictions: WBAT with KI and Cam boot in place      Mobility  Bed Mobility               General bed mobility comments: Pt up in chair and returns to same.  Pt states has lift chair at home and plans to sleep in same    Transfers Overall transfer level: Needs assistance Equipment used: Hemi-walker Transfers: Sit to/from Stand Sit to Stand: Min assist           General transfer comment: STS x2 and requiring cues and external asisst on both attempts.    Ambulation/Gait Ambulation/Gait assistance: Min assist Gait Distance (Feet): 28 Feet Assistive device: Hemi-walker Gait Pattern/deviations: Step-to pattern, Decreased step length - right, Decreased step length - left, Shuffle, Trunk flexed Gait velocity: decr     General Gait Details: cues for sequence, posture and position from RW;  Min assist for stability  Stairs            Wheelchair Mobility    Modified Rankin (Stroke Patients Only)       Balance Overall balance assessment: Needs assistance, History of Falls Sitting-balance support: Feet supported, No upper extremity supported, Single extremity supported Sitting balance-Leahy Scale: Fair     Standing balance support: Single extremity supported Standing balance-Leahy Scale: Poor                               Pertinent Vitals/Pain Pain Assessment Pain Assessment: 0-10 Pain Score: 4  Pain Location: L knee and R shoulder Pain Descriptors / Indicators: Aching, Sore, Grimacing Pain Intervention(s): Limited activity within  patient's tolerance, Monitored during session, Premedicated before session, Ice applied    Home Living Family/patient expects to be discharged to:: Private residence Living Arrangements: Alone Available Help at Discharge: Family;Available 24 hours/day;Available PRN/intermittently Type of Home: Apartment Home Access: Level entry       Home Layout: One level Home Equipment:  Rollator (4 wheels);Cane - single point;Shower seat;Grab bars - tub/shower;Other (comment) (hemi-walker)      Prior Function Prior Level of Function : Needs assist             Mobility Comments: Recently at rehabilitation where he reports he was receiving assist with transfers and gait ADLs Comments: REcently at rehab where he reports he was having assist with ADL. ALso legally blind so use of compensatory techniques at baseline for ADL and IADL     Hand Dominance   Dominant Hand: Right    Extremity/Trunk Assessment   Upper Extremity Assessment Upper Extremity Assessment: RUE deficits/detail;Defer to OT evaluation    Lower Extremity Assessment Lower Extremity Assessment: LLE deficits/detail LLE Deficits / Details: in KI and Cam boot, able to lift leg from bed. COmpensatory techniques for gait. Requires cueing for transfers to manage LLE    Cervical / Trunk Assessment Cervical / Trunk Assessment: Normal  Communication   Communication: No difficulties  Cognition Arousal/Alertness: Awake/alert Behavior During Therapy: WFL for tasks assessed/performed Overall Cognitive Status: Within Functional Limits for tasks assessed                                          General Comments      Exercises     Assessment/Plan    PT Assessment Patient needs continued PT services  PT Problem List Decreased activity tolerance;Decreased safety awareness;Pain;Decreased range of motion;Decreased balance;Decreased knowledge of use of DME;Decreased knowledge of precautions       PT Treatment Interventions DME instruction;Gait training;Functional mobility training;Therapeutic activities;Therapeutic exercise;Patient/family education;Balance training    PT Goals (Current goals can be found in the Care Plan section)  Acute Rehab PT Goals Patient Stated Goal: to go home PT Goal Formulation: With patient/family Time For Goal Achievement: 09/08/22 Potential to Achieve Goals:  Good    Frequency Min 1X/week     Co-evaluation               AM-PAC PT "6 Clicks" Mobility  Outcome Measure Help needed turning from your back to your side while in a flat bed without using bedrails?: A Lot Help needed moving from lying on your back to sitting on the side of a flat bed without using bedrails?: A Lot Help needed moving to and from a bed to a chair (including a wheelchair)?: A Little Help needed standing up from a chair using your arms (e.g., wheelchair or bedside chair)?: A Little Help needed to walk in hospital room?: A Little Help needed climbing 3-5 steps with a railing? : A Lot 6 Click Score: 15    End of Session Equipment Utilized During Treatment: Gait belt;Left knee immobilizer;Other (comment) Activity Tolerance: Patient tolerated treatment well;Patient limited by fatigue Patient left: in chair;with call bell/phone within reach;with chair alarm set Nurse Communication: Mobility status PT Visit Diagnosis: Unsteadiness on feet (R26.81);Other abnormalities of gait and mobility (R26.89);History of falling (Z91.81)    Time: 1610-9604 PT Time Calculation (min) (ACUTE ONLY): 28 min   Charges:   PT Evaluation $PT Eval Low Complexity: 1 Low PT  Treatments $Gait Training: 8-22 mins        Mauro Kaufmann PT Acute Rehabilitation Services Pager [phone] Office [phone]   [facility] 09/07/2022, 5:08 PM

## 2022-09-07 NOTE — TOC Transition Note (Signed)
Transition of Care [facility]) - CM/SW Discharge Note   Patient Details  Name: Kenneth Henson MRN: [id number] Date of Birth: 13-May-1941  Transition [facility] Care [facility]) CM/SW Contact:  Amada Jupiter, LCSW Phone Number: 09/07/2022, 2:19 PM   Clinical Narrative:     Met with pt this morning and spoke with daughter via phone.  Both report that daughter has secured ~ 13 hrs/ week of aide services via the [place] as well as others to assist.  They are in agreement with plan for HHPT/OT and no agency preference - referral accepted with [facility].  Pt does have hemi walker at home but needs bedside commode - no DME agency preference - ordered via [employer] for delivery to room.  At this time, no further TOC needs.  Final next level of care: Home w Home Health Services Barriers to Discharge: Barriers Resolved   Patient Goals and CMS Choice      Discharge Placement                         Discharge Plan and Services Additional resources added to the After Visit Summary for                  DME Arranged: Bedside commode DME Agency: [employer] Date DME Agency Contacted: 09/07/22 Time DME Agency Contacted: 1400 Representative spoke with at DME Agency: Vaughan Basta HH Arranged: PT, OT HH Agency: [facility] Date [facility] Agency Contacted: 09/07/22 Time HH Agency Contacted: 1000 Representative spoke with at [facility] Agency: Hassel Neth  Social Determinants of Health (SDOH) Interventions SDOH Screenings   Food Insecurity: No Food Insecurity (09/06/2022)  Housing: Low Risk  (09/06/2022)  Transportation Needs: No Transportation Needs (09/06/2022)  Utilities: Not At Risk (09/06/2022)  Alcohol Screen: Low Risk  (06/20/2022)  Depression (PHQ2-9): Low Risk  (06/20/2022)  Financial Resource Strain: Low Risk  (06/20/2022)  Physical Activity: Inactive (06/20/2022)  Social Connections: Moderately Integrated (06/20/2022)  Stress: No Stress Concern Present (06/20/2022)  Tobacco Use: Medium Risk (09/06/2022)      Readmission Risk Interventions    09/07/2022    2:16 PM  Readmission Risk Prevention Plan  Post Dischage Appt Complete  Medication Screening Complete  Transportation Screening Complete

## 2022-09-08 ENCOUNTER — Telehealth: Payer: Self-pay

## 2022-09-08 LAB — AEROBIC/ANAEROBIC CULTURE W GRAM STAIN (SURGICAL/DEEP WOUND)

## 2022-09-08 NOTE — Transitions of Care (Post Inpatient/ED Visit) (Signed)
   09/08/2022  Name: Kenneth Henson MRN: [id number] DOB: 10/08/1940  Today's TOC FU Call Status: Today's TOC FU Call Status:: Successful TOC FU Call Competed TOC FU Call Complete Date: 09/08/22  Transition Care Management Follow-up Telephone Call Date of Discharge: 09/07/22 Discharge Facility: [facility]) Type of Discharge: Inpatient Admission Primary Inpatient Discharge Diagnosis:: "s/p reverse total arthroplasty of right shoulder" How have you been since you were released from the hospital?: Better (Pt states he slept well in his lounge chair last night. Pain controlled-alternating between pain meds to "keep something in his system") Any questions or concerns?: No  Items Reviewed: Did you receive and understand the discharge instructions provided?: Yes Medications obtained and verified?: Yes (Medications Reviewed) Any new allergies since your discharge?: No Dietary orders reviewed?: Yes Type of Diet Ordered:: low salt/heart healthy/carb modified Do you have support at home?: Yes People in Home: child(ren), adult Name of Support/Comfort Primary Source: two daughters helping him out, also has aide through [place] 13hrs/wk  Home Care and Equipment/Supplies: Were Home Health Services Ordered?: Yes Name of Home Health Agency:: [facility] Has Agency set up a time to come to your home?: No (Pt states his daughter spoke with agency today and they will be calling back with visit date and time) Any new equipment or medical supplies ordered?: Yes Name of Medical supply agency?: [employer] Were you able to get the equipment/medical supplies?: Yes Do you have any questions related to the use of the equipment/supplies?: No  Functional Questionnaire: Do you need assistance with bathing/showering or dressing?: Yes Do you need assistance with meal preparation?: Yes Do you need assistance with eating?: No Do you have difficulty maintaining continence: No Do you need assistance with getting  out of bed/getting out of a chair/moving?: No Do you have difficulty managing or taking your medications?: No  Follow up appointments reviewed: PCP Follow-up appointment confirmed?: NA Specialist Hospital Follow-up appointment confirmed?: Yes Date of Specialist follow-up appointment?: 09/19/22 Follow-Up Specialty Provider:: Dr. Everardo Pacific Do you need transportation to your follow-up appointment?: No (pt states he uses transportation agency to get to appts-unable to recall name but vocies he has contact info written down and will call to arrange transport for appts if family not able to take him to appt)  SDOH Interventions Today    Flowsheet Row Most Recent Value  SDOH Interventions   Food Insecurity Interventions Intervention Not Indicated  Transportation Interventions Intervention Not Indicated       TOC Interventions Today    Flowsheet Row Most Recent Value  TOC Interventions   TOC Interventions Discussed/Reviewed TOC Interventions Discussed, S/S of infection, Post op wound/incision care, Post discharge activity limitations per provider      Interventions Today    Flowsheet Row Most Recent Value  General Interventions   General Interventions Discussed/Reviewed General Interventions Discussed, Doctor Visits  Doctor Visits Discussed/Reviewed Doctor Visits Discussed, Specialist  PCP/Specialist Visits Compliance with follow-up visit  Education Interventions   Education Provided Provided Education  Provided Verbal Education On When to see the doctor, Medication, Nutrition  Nutrition Interventions   Nutrition Discussed/Reviewed Nutrition Discussed, Adding fruits and vegetables  Pharmacy Interventions   Pharmacy Dicussed/Reviewed Pharmacy Topics Discussed, Medications and their functions  Safety Interventions   Safety Discussed/Reviewed Safety Discussed        Alessandra Grout [facility] Care Management Care Management Community Coordinator Direct Phone:  [phone] Toll Free: [phone] Fax: [phone]

## 2022-09-09 LAB — AEROBIC/ANAEROBIC CULTURE W GRAM STAIN (SURGICAL/DEEP WOUND)

## 2022-09-10 LAB — AEROBIC/ANAEROBIC CULTURE W GRAM STAIN (SURGICAL/DEEP WOUND)

## 2022-09-11 ENCOUNTER — Encounter (HOSPITAL_COMMUNITY): Payer: Self-pay | Admitting: Orthopaedic Surgery

## 2022-09-11 LAB — FUNGUS CULTURE WITH STAIN

## 2022-09-11 LAB — FUNGUS CULTURE RESULT

## 2022-09-11 LAB — AEROBIC/ANAEROBIC CULTURE W GRAM STAIN (SURGICAL/DEEP WOUND): Gram Stain: NONE SEEN

## 2022-09-16 LAB — AEROBIC/ANAEROBIC CULTURE W GRAM STAIN (SURGICAL/DEEP WOUND)

## 2022-09-17 LAB — AEROBIC/ANAEROBIC CULTURE W GRAM STAIN (SURGICAL/DEEP WOUND): Culture: NO GROWTH

## 2022-09-18 LAB — AEROBIC/ANAEROBIC CULTURE W GRAM STAIN (SURGICAL/DEEP WOUND)

## 2022-09-20 LAB — AEROBIC/ANAEROBIC CULTURE W GRAM STAIN (SURGICAL/DEEP WOUND)

## 2022-09-21 LAB — AEROBIC/ANAEROBIC CULTURE W GRAM STAIN (SURGICAL/DEEP WOUND): Gram Stain: NONE SEEN

## 2022-09-22 LAB — AEROBIC/ANAEROBIC CULTURE W GRAM STAIN (SURGICAL/DEEP WOUND)

## 2022-09-23 LAB — AEROBIC/ANAEROBIC CULTURE W GRAM STAIN (SURGICAL/DEEP WOUND): Culture: NO GROWTH

## 2022-09-26 LAB — AEROBIC/ANAEROBIC CULTURE W GRAM STAIN (SURGICAL/DEEP WOUND): Gram Stain: NONE SEEN

## 2022-10-03 ENCOUNTER — Other Ambulatory Visit (HOSPITAL_COMMUNITY): Payer: Self-pay | Admitting: Orthopaedic Surgery

## 2022-10-03 DIAGNOSIS — M79605 Pain in left leg: Secondary | ICD-10-CM

## 2022-10-04 ENCOUNTER — Ambulatory Visit (INDEPENDENT_AMBULATORY_CARE_PROVIDER_SITE_OTHER): Payer: Medicare Other | Admitting: Infectious Diseases

## 2022-10-04 ENCOUNTER — Telehealth: Payer: Self-pay

## 2022-10-04 ENCOUNTER — Ambulatory Visit (HOSPITAL_COMMUNITY)
Admission: RE | Admit: 2022-10-04 | Discharge: 2022-10-04 | Disposition: A | Discharge status: Home / Self Care | Payer: Medicare Other | Source: Ambulatory Visit | Attending: Cardiovascular Disease | Admitting: Cardiovascular Disease

## 2022-10-04 ENCOUNTER — Other Ambulatory Visit: Payer: Self-pay

## 2022-10-04 ENCOUNTER — Encounter: Payer: Self-pay | Admitting: Infectious Diseases

## 2022-10-04 VITALS — BP 154/84 | HR 101 | Temp 97.6°F | Wt 244.4 lb

## 2022-10-04 DIAGNOSIS — T8450XA Infection and inflammatory reaction due to unspecified internal joint prosthesis, initial encounter: Secondary | ICD-10-CM

## 2022-10-04 DIAGNOSIS — Z79899 Other long term (current) drug therapy: Secondary | ICD-10-CM

## 2022-10-04 DIAGNOSIS — Z452 Encounter for adjustment and management of vascular access device: Secondary | ICD-10-CM | POA: Insufficient documentation

## 2022-10-04 DIAGNOSIS — M79605 Pain in left leg: Secondary | ICD-10-CM | POA: Diagnosis present

## 2022-10-04 DIAGNOSIS — Z96611 Presence of right artificial shoulder joint: Secondary | ICD-10-CM | POA: Diagnosis not present

## 2022-10-04 LAB — CBC
Hemoglobin: 13.5 g/dL (ref 13.2–17.1)
MCV: 86.7 fL (ref 80.0–100.0)
MPV: 10.2 fL (ref 7.5–12.5)
RDW: 13.1 % (ref 11.0–15.0)

## 2022-10-04 MED ORDER — LINEZOLID 600 MG PO TABS
600.0000 mg | ORAL_TABLET | Freq: Two times a day (BID) | ORAL | 0 refills | Status: DC
Start: 2022-10-04 — End: 2022-10-04

## 2022-10-04 MED ORDER — LINEZOLID 600 MG PO TABS: 600.0000 mg | ORAL_TABLET | Freq: Two times a day (BID) | ORAL | 0 refills | Status: DC

## 2022-10-04 NOTE — Progress Notes (Addendum)
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 10/27/2020   Exudative age-related macular degeneration of left eye with active choroidal neovascularization (HCC) 10/27/2020   Diabetes (HCC) 09/01/2020   Retinal tear 09/01/2020   Headache(784.0) 01/16/2012   Atypical nevus 11/07/2011   Trigger finger 05/22/2011   Bronchitis 05/22/2011   Preop cardiovascular exam 04/11/2011   Knee pain 02/24/2011   Bursitis of elbow 10/31/2010   OBESITY 04/28/2010   INCONTINENCE, URGE 04/28/2010   LEG PAIN, BILATERAL 12/08/2009   CHEST PAIN 12/08/2009   ELECTROCARDIOGRAM, ABNORMAL 12/08/2009   NEOPLASM, SKIN, UNCERTAIN BEHAVIOR 12/06/2009   PREMATURE ATRIAL CONTRACTIONS 12/06/2009   COLONIC POLYPS, ADENOMATOUS, HX OF 11/17/2009   ERECTILE DYSFUNCTION, 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   Hyperlipidemia 09/29/2009   TOBACCO USE 09/29/2009   Essential hypertension 09/29/2009   SHOULDER, PAIN 09/29/2009   HIP PAIN, LEFT 09/29/2009   ABNORMAL INVOLUNTARY MOVEMENTS 09/29/2009    Patient's Medications  New Prescriptions   No medications on file  Previous Medications   ALBUTEROL (VENTOLIN HFA) 108 (90 BASE) MCG/ACT INHALER    Inhale 2 puffs into the lungs every 6 (six) hours as needed for wheezing or shortness of breath.   ALLOPURINOL (ZYLOPRIM) 300 MG TABLET    Take 300 mg by mouth every 12 (twelve) hours as needed (gout).    AMLODIPINE (NORVASC) 10 MG TABLET    Take 1 tablet (10 mg total) by mouth daily.   ASPIRIN (ASPIRIN CHILDRENS) 81 MG CHEWABLE TABLET    Chew 1 tablet (81 mg total) by mouth 2 (two) times daily. For 6 weeks for DVT prophylaxis after surgery   CELECOXIB (CELEBREX) 100 MG CAPSULE    Take 1 capsule (100 mg total) by mouth 2 (two) times daily. For 2 weeks. Then take as needed   DICLOFENAC SODIUM (VOLTAREN) 1 % GEL    Apply 2 g topically 4 (four) times daily.   GABAPENTIN (NEURONTIN) 300 MG CAPSULE    Take 1 tab before bed x 2 weeks.  May increase to 1 tab at dinner for 2 weeks and then may increase to am, dinner and before bed thereafter for pain.   HYDROCODONE-ACETAMINOPHEN (NORCO) 5-325 MG TABLET    Take 1-2 tablets by mouth every 6 (six) hours as needed for severe pain.   LINZESS 145 MCG CAPS CAPSULE    Take 145 mcg by mouth as needed (constipation).   LISINOPRIL (PRINIVIL,ZESTRIL) 40 MG TABLET    Take 1 tablet (40 mg total) by mouth daily.   METFORMIN (GLUCOPHAGE) 850 MG TABLET    Take 1 tablet (850 mg total) by mouth daily.   MIRTAZAPINE (REMERON) 15 MG TABLET    Take 15 mg by mouth at bedtime.   MULTIPLE VITAMINS-MINERALS (PRESERVISION AREDS PO)    Take 1 tablet by mouth 2 (two) times daily.   OMEPRAZOLE (PRILOSEC) 20 MG CAPSULE    Take 1 capsule (20 mg total) by mouth daily. To gastric protection while  taking NSAIDs   SILDENAFIL (VIAGRA) 100 MG TABLET    Take 1 tablet (100 mg total) by mouth as needed for erectile dysfunction.   SIMVASTATIN (ZOCOR) 20 MG TABLET    Take 1 tablet (20 mg total) by mouth daily.   TEMAZEPAM (RESTORIL) 15 MG CAPSULE    Take 15 mg by mouth at bedtime.  Modified Medications   No medications on file  Discontinued Medications   No medications on file    Subjective: 82 Y O male with PMH as below including legal blindness, hypertension, hyperlipidemia, diabetes mellitus, gout, peripheral neuropathy,  gait instability with recurrent falls, Bilateral shoulder  arthroplasties (  right shoulder reverse shoulder arthroplasty done with [employer] implants about four years ago in [place], left reverse total shoulder arthroplasty done by Dr. Beverely Low that has done well for about the last 10 years) who is referred from Ortho for concerns of rt shoulder PJI. S/p (09/06/22)Revision of reverse rt shoulder arthroplasty, open synovectomy and open reduction of rt shoulder dislocation. OR cx 2/3 samples rare proprionibacterium acnes. Fungal stain negative, cx pending. Patient was admitted 3/24-3/29 for fall. Orthopedics was consulted for left toe, left lateral tibial plateaue # and left lateral malleolus  # and was advised conservative management.   3/24 CT rt shoulder: IMPRESSION: 1. 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 2. Moderate-large volume of fluid within the subacromial-subdeltoid bursal space suggesting bursitis.  He did not have any fevers, chills or concerns of infection at the rt shoulder prior to the fall. Denies taking abtx prior to visit. He has no pain, tenderness, swelling at rt shoulder except limitation of ROM. Seen by Ortho yesterday. He had Vascular US of LLE today which was negative for DVT.   I spoke with Daughter [name] per patient's request to discuss plan of care. She said she will be back to [facility] by Sunday and will help  her father with receiving IV abtx at home. Patient lives in an apartment and also has a caretaker with him. Reports being legally blind but able to count fingers. Denies smoking, alcohol and IVDU. He has no complaints otherwise.   Review of Systems: all systems reviewed with pertinent positives and negatives as listed above   Past Medical History:  Diagnosis Date   Abnormal involuntary movements(781.0)    Arthritis    Chicken pox    Claudication (HCC)    Colon polyps    Diabetes mellitus type II    Erectile dysfunction     Hyperlipidemia    Hypertension    Neoplasm of skin    uncertain behavior   Past Surgical History:  Procedure Laterality Date   CARPAL TUNNEL RELEASE     right   HAND SURGERY     KNEE SURGERY     MINOR HARDWARE REMOVAL Right 09/06/2022   Procedure: MINOR HARDWARE REMOVAL;  Surgeon: Bjorn Pippin, MD;  Location: [facility];  Service: Orthopedics;  Laterality: Right;   NECK SURGERY     ORIF HUMERUS FRACTURE Right 09/06/2022   Procedure: OPEN REDUCTION INTERNAL FIXATION (ORIF) PROXIMAL HUMERUS FRACTURE;  Surgeon: Bjorn Pippin, MD;  Location: [facility];  Service: Orthopedics;  Laterality: Right;   SHOULDER SURGERY Right    2020   SHOULDER SURGERY Left    2011   TOTAL SHOULDER REVISION Right 09/06/2022   Procedure: TOTAL SHOULDER REVISION;  Surgeon: Bjorn Pippin, MD;  Location: [facility];  Service: Orthopedics;  Laterality: Right;  Social History   Tobacco Use   Smoking status: Former    Types: Cigarettes    Quit date: 05/15/2009    Years since quitting: 13.3   Smokeless tobacco: Never  Substance Use Topics   Alcohol use: Yes    Alcohol/week: 3.0 standard drinks of alcohol    Types: 1 Glasses of wine, 1 Cans of beer, 1 Shots of liquor per week    Comment: social   Drug use: No    Family History  Problem Relation Age of Onset   Hypertension Mother    Heart disease Mother    Stroke Father    Colon cancer Neg Hx    Stomach cancer Neg Hx    Breast cancer Sister    Colon polyps Sister     Allergies  Allergen Reactions   Doxycycline Nausea And Vomiting   Oxycodone-Acetaminophen Itching    Health Maintenance  Topic Date Due   OPHTHALMOLOGY EXAM  07/07/2021   FOOT EXAM  09/01/2021   COVID-19 Vaccine (6 - 2023-24 season) 01/13/2022   Diabetic kidney evaluation - Urine ACR  03/01/2022   INFLUENZA VACCINE  12/14/2022   HEMOGLOBIN A1C  02/07/2023   Medicare Annual Wellness (AWV)  06/21/2023   Diabetic kidney evaluation - eGFR measurement  08/08/2023   DTaP/Tdap/Td (5 - Td or  Tdap) 03/14/2028   Pneumonia Vaccine 59+ Years old  Completed   Zoster Vaccines- Shingrix  Completed   HPV VACCINES  Aged Out   COLONOSCOPY (Pts 45-42yrs Insurance coverage will need to be confirmed)  Discontinued    Objective: BP (!) 154/84   Pulse (!) 101   Temp 97.6 F (36.4 C) (Oral)   Wt 244 lb 6.4 oz (110.9 kg)   SpO2 95%   BMI 36.09 kg/m    Physical Exam Constitutional:      Appearance: Normal appearance. Morbidly obese, ambulatory with a cane.  HENT:     Head: Normocephalic and atraumatic.      Mouth: Mucous membranes are moist.  Eyes:    Conjunctiva/sclera: Conjunctivae normal.     Pupils: Pupils are equal, round, and bilaterally symmetrical   Cardiovascular:     Rate and Rhythm: Normal rate and regular rhythm.     Heart sounds: s1s2  Pulmonary:     Effort: Pulmonary effort is normal.     Breath sounds: Normal breath sounds.   Abdominal:     General: Non distended     Palpations: soft.   Musculoskeletal:        General:  RT shoulder reverse shoulder arthroplasty surgical site has healed. He cannot fully extend his rt shoulder beyond 90 degrees. Left shoulder reverse shoulder arthroplasty without any concerns. Left knee bandaged, ambulatory with a cane.   Skin:    General: Skin is warm and dry.     Comments:  Neurological:     General: grossly non focal     Mental Status: awake, alert and oriented to person, place, and time.   Psychiatric:        Mood and Affect: Mood normal.   Lab Results Lab Results  Component Value Date   WBC 15.7 (H) 09/07/2022   HGB 13.6 09/07/2022   HCT 40.8 09/07/2022   MCV 89.1 09/07/2022   PLT 309 09/07/2022    Lab Results  Component Value Date   CREATININE 0.74 08/08/2022   BUN 9 08/08/2022   NA 136 08/08/2022   K 4.2 08/08/2022   CL 104 08/08/2022   CO2 24  08/08/2022    Lab Results  Component Value Date   ALT 12 08/06/2022   AST 17 08/06/2022   ALKPHOS 222 (H) 08/06/2022   BILITOT 0.8 08/06/2022    Lab  Results  Component Value Date   CHOL 182 02/24/2011   HDL 74.60 02/24/2011   LDLCALC 92 02/24/2011   TRIG 78.0 02/24/2011   CHOLHDL 2 02/24/2011   No results found for: "LABRPR", "RPRTITER" No results found for: "HIV1RNAQUANT", "HIV1RNAVL", "CD4TABS"  Microbiology Results for orders placed or performed during the hospital encounter of 09/06/22  Surgical pcr screen     Status: Abnormal   Collection Time: 09/06/22 10:14 AM   Specimen: Nasal Mucosa; Nasal Swab  Result Value Ref Range Status   MRSA, PCR NEGATIVE NEGATIVE Final   Staphylococcus aureus POSITIVE (A) NEGATIVE Final    Comment: (NOTE) The Xpert SA Assay (FDA approved for NASAL specimens in patients 43 years of age and older), is one component of a comprehensive surveillance program. It is not intended to diagnose infection nor to guide or monitor treatment. Performed at [facility], [address], [place], [place] [address]   Fungus Culture With Stain     Status: None (Preliminary result)   Collection Time: 09/06/22  1:40 PM   Specimen: Synovial, Right Shoulder; Body Fluid  Result Value Ref Range Status   Fungus Stain Final report  Final    Comment: (NOTE) Performed At: [facility] [address] [place], [place] [id number] Jolene Schimke MD ZD:[id number]    Fungus (Mycology) Culture PENDING  Incomplete   Fungal Source TISSUE  Final    Comment: Performed at [facility], [address], [place], [place] [address]  Fungus Culture With Stain     Status: None (Preliminary result)   Collection Time: 09/06/22  1:40 PM   Specimen: Synovial, Right Shoulder; Body Fluid  Result Value Ref Range Status   Fungus Stain Final report  Final    Comment: (NOTE) Performed At: [facility] [address] [place], [place] [id number] Jolene Schimke MD [id number]    Fungus (Mycology) Culture PENDING  Incomplete   Fungal Source TISSUE  Final    Comment: Performed at  [facility], [address], [place], [place] [address]  Fungus Culture With Stain     Status: None (Preliminary result)   Collection Time: 09/06/22  1:40 PM   Specimen: Synovial, Right Shoulder; Body Fluid  Result Value Ref Range Status   Fungus Stain Final report  Final    Comment: (NOTE) Performed At: [facility] [address] [place], [place] [id number] Jolene Schimke MD [id number]    Fungus (Mycology) Culture PENDING  Incomplete   Fungal Source TISSUE  Final    Comment: Performed at [facility], [address], [place], [place] [address]  Aerobic/Anaerobic Culture w Gram Stain (surgical/deep wound)     Status: None   Collection Time: 09/06/22  1:40 PM   Specimen: Synovial, Right Shoulder; Body Fluid  Result Value Ref Range Status   Specimen Description   Final    TISSUE Performed at [facility], [address], [place], [place] [address]    Special Requests R SHOULDER HOLD 21 DAYS  Final   Gram Stain NO WBC SEEN NO ORGANISMS SEEN   Final   Culture   Final    NO GROWTH 21 DAYS CONTINUING TO HOLD Performed at [facility], [address] [place], [place] [address]  Report Status 09/27/2022 FINAL  Final  Aerobic/Anaerobic Culture w Gram Stain (surgical/deep wound)     Status: None   Collection Time: 09/06/22  1:40 PM   Specimen: Synovial, Right Shoulder; Body Fluid  Result Value Ref Range Status   Specimen Description   Final    TISSUE Performed at [facility], [address] [place], [place] [address]    Special Requests   Final    R SHOULDER Performed at [facility], [address] [place], [place] [address]    Gram Stain NO WBC SEEN NO ORGANISMS SEEN   Final   Culture   Final    RARE PROPIONIBACTERIUM ACNES Standardized susceptibility testing for this organism is not available. Performed at [facility], [address],  [place], [place] [address]    Report Status 09/11/2022 FINAL  Final  Aerobic/Anaerobic Culture w Gram Stain (surgical/deep wound)     Status: None   Collection Time: 09/06/22  1:40 PM   Specimen: Synovial, Right Shoulder; Body Fluid  Result Value Ref Range Status   Specimen Description   Final    TISSUE Performed at [facility], [address] [place], [place] [address]    Special Requests   Final    R SHOULDER Performed at [facility], [address], [place], [place] [address]    Gram Stain NO WBC SEEN NO ORGANISMS SEEN   Final   Culture   Final    RARE PROPIONIBACTERIUM ACNES NO ANAEROBES ISOLATED CRITICAL RESULT CALLED TO, READ BACK BY AND VERIFIED WITH: RN [email or web address].P AT 0913 ON 09/25/2022 BY T.SAAD Performed at [facility], [address], [place], [place] [address]    Report Status 09/25/2022 FINAL  Final  Fungus Culture Result     Status: None   Collection Time: 09/06/22  1:40 PM  Result Value Ref Range Status   Result 1 Comment  Final    Comment: (NOTE) KOH/Calcofluor preparation:  no fungus observed. Performed At: [facility] [address] [place], [place] [id number] Jolene Schimke MD [id number]   Fungus Culture Result     Status: None   Collection Time: 09/06/22  1:40 PM  Result Value Ref Range Status   Result 1 Comment  Final    Comment: (NOTE) KOH/Calcofluor preparation:  no fungus observed. Performed At: [facility] [address] [place], [place] [id number] Jolene Schimke MD [id number]   Fungus Culture Result     Status: None   Collection Time: 09/06/22  1:40 PM  Result Value Ref Range Status   Result 1 Comment  Final    Comment: (NOTE) KOH/Calcofluor preparation:  no fungus observed. Performed At: [facility] [address] [place], [place] [id number] Jolene Schimke MD NA:[id number]    Imaging VAS [place] LOWER EXTREMITY VENOUS (DVT)  Result Date: 10/04/2022  Lower Venous DVT  Study Patient Name:  Kenneth Henson  Date of Exam:   10/04/2022 Medical Rec #: [id number]         Accession #:    [id number] Date of Birth: 02-Oct-1940         Patient Gender: M Patient Age:   79 years Exam Location:  [facility] Procedure:      VAS [place] LOWER EXTREMITY VENOUS (DVT) Referring Phys: Ramond Marrow --------------------------------------------------------------------------------  Other Indications: Patient presents today with left knee pain that has been  going on for a few months. He had a fall at the end of March                    in which he injured his shoulder and fractured the left                    tibial plateau. Evaluate for DVT. Performing Technologist: Olegario Shearer RVT  Examination Guidelines: A complete evaluation includes B-mode imaging, spectral Doppler, color Doppler, and power Doppler as needed of all accessible portions of each vessel. Bilateral testing is considered an integral part of a complete examination. Limited examinations for reoccurring indications may be performed as noted. The reflux portion of the exam is performed with the patient in reverse Trendelenburg.  +-----+---------------+---------+-----------+----------+--------------+ RIGHTCompressibilityPhasicitySpontaneityPropertiesThrombus Aging +-----+---------------+---------+-----------+----------+--------------+ CFV  Full           Yes      Yes                                 +-----+---------------+---------+-----------+----------+--------------+   +---------+---------------+---------+-----------+----------+--------------+ LEFT     CompressibilityPhasicitySpontaneityPropertiesThrombus Aging +---------+---------------+---------+-----------+----------+--------------+ CFV      Full           Yes      Yes                                 +---------+---------------+---------+-----------+----------+--------------+ SFJ      Full           Yes      Yes                                  +---------+---------------+---------+-----------+----------+--------------+ FV Prox  Full           Yes      Yes                                 +---------+---------------+---------+-----------+----------+--------------+ FV Mid   Full           Yes      Yes                                 +---------+---------------+---------+-----------+----------+--------------+ FV DistalFull           Yes      Yes                                 +---------+---------------+---------+-----------+----------+--------------+ PFV      Full                                                        +---------+---------------+---------+-----------+----------+--------------+ POP      Full           Yes      Yes                                 +---------+---------------+---------+-----------+----------+--------------+ PTV  Full           Yes      Yes                                 +---------+---------------+---------+-----------+----------+--------------+ PERO     Full           Yes      Yes                                 +---------+---------------+---------+-----------+----------+--------------+ Gastroc  Full                                                        +---------+---------------+---------+-----------+----------+--------------+ GSV      Full           Yes      Yes                                 +---------+---------------+---------+-----------+----------+--------------+   Findings reported to [facility] at 11:20 am.  Summary: RIGHT: - No evidence of common femoral vein obstruction.  LEFT: - No evidence of deep vein thrombosis in the lower extremity. No indirect evidence of obstruction proximal to the inguinal ligament. - No cystic structure found in the popliteal fossa.  *See table(s) above for measurements and observations.    Preliminary    DG Shoulder Right Port  Result Date: 09/06/2022 CLINICAL DATA:  Status post reverse total right  shoulder arthroplasty. EXAM: RIGHT SHOULDER - 1 VIEW COMPARISON:  August 06, 2022. FINDINGS: Glenoid and humeral components are well situated. Expected postoperative changes are seen in the surrounding soft tissues. IMPRESSION: Status post reverse right total shoulder arthroplasty. Electronically Signed   By: Lupita Raider M.D.   On: 09/06/2022 15:47    Assessment/Plan 70 Y O male with PMH as below including legal blindness, hypertension, hyperlipidemia, diabetes mellitus, gout, peripheral neuropathy,  gait instability with recurrent falls, Bilateral shoulder arthroplasties (  right shoulder reverse shoulder arthroplasty done with [employer] implants about four years ago in [place], left reverse total shoulder arthroplasty done by Dr. Beverely Low that has done well for about the last 10 years) with   # Rt shoulder PJI  # Medication management  - s/p rt revision reverse shoulder arthroplasty, open synovectomy and open treatment of shoulder dislocation. 2/3 Cx Proprionibacterium acnes, Fungal stain negative, fungal cx pending  - Plan for 6 weeks IV pen G as in OPAT  - Bridge with Linezolid until PICC/OPAT done. He will hold mirtazapine which he takes for sleep while on linezolid.  - PICC to be placed 5/27 or after once daughter back in town to assist. He also has a caretaker -Labs today  -Fu in 2 weeks, fu pending fungal cultures  - Fu with Orthopedics as instructed   OPAT  Diagnosis: rt shoulder PJI   Culture Result: Proprionibacterium acnes   Allergies  Allergen Reactions   Doxycycline Nausea And Vomiting   Oxycodone-Acetaminophen Itching    OPAT Orders Discharge antibiotics to be given via PICC line Discharge antibiotics: Penicillin G 20 million units continuous daily infusion  Per pharmacy protocol  Duration: 6 weeks  End Date: 6 weeks from start date   [facility] Care Per Protocol:  Home health RN for IV administration and teaching; PICC line care and labs.    Labs weekly  while on IV antibiotics: X__ CBC with differential __ BMP X__ CMP X__ CRP X__ ESR __ Vancomycin trough __ CK  X__ Please pull PIC at completion of IV antibiotics __ Please leave PIC in place until doctor has seen patient or been notified  Fax weekly labs to [phone]  Clinic Follow Up Appt: 2 weeks   I have personally spent 74  minutes involved in face-to-face and non-face-to-face activities for this patient on the day of the visit. Professional time spent includes the following activities: Preparing to see the patient (review of tests), Obtaining and/or reviewing separately obtained history (admission/discharge record), Performing a medically appropriate examination and/or evaluation , Ordering medications/tests/procedures, referring and communicating with other health care professionals, Documenting clinical information in the EMR, Independently interpreting results (not separately reported), Communicating results to the patient/family/caregiver, Counseling and educating the patient/family/caregiver and Care coordination (not separately reported).   Victoriano Lain, MD [facility] 10/04/2022, 1:01 PM

## 2022-10-04 NOTE — Telephone Encounter (Signed)
Per Dr. Elinor Parkinson reached out to IR to schedule picc line appointment. Was able to get patient scheduled for 5/30 at 2 pm. Understands he will need to check in 15 mins before appointment at [facility] admissions.  Will update patient regarding first dose after [employer] team gets back to me.  Juanita Laster, RMA

## 2022-10-05 LAB — FUNGUS CULTURE WITH STAIN

## 2022-10-05 LAB — COMPREHENSIVE METABOLIC PANEL
AG Ratio: 1.4 (calc) (ref 1.0–2.5)
ALT: 15 U/L (ref 9–46)
AST: 14 U/L (ref 10–35)
Albumin: 4.2 g/dL (ref 3.6–5.1)
Alkaline phosphatase (APISO): 283 U/L — ABNORMAL HIGH (ref 35–144)
BUN: 13 mg/dL (ref 7–25)
CO2: 21 mmol/L (ref 20–32)
Calcium: 9.6 mg/dL (ref 8.6–10.3)
Chloride: 99 mmol/L (ref 98–110)
Creat: 0.76 mg/dL (ref 0.70–1.22)
Globulin: 3.1 g/dL (calc) (ref 1.9–3.7)
Glucose, Bld: 260 mg/dL — ABNORMAL HIGH (ref 65–99)
Potassium: 4.3 mmol/L (ref 3.5–5.3)
Sodium: 135 mmol/L (ref 135–146)
Total Bilirubin: 0.3 mg/dL (ref 0.2–1.2)
Total Protein: 7.3 g/dL (ref 6.1–8.1)

## 2022-10-05 LAB — CBC
HCT: 40.5 % (ref 38.5–50.0)
MCH: 28.9 pg (ref 27.0–33.0)
MCHC: 33.3 g/dL (ref 32.0–36.0)
Platelets: 327 10*3/uL (ref 140–400)
RBC: 4.67 10*6/uL (ref 4.20–5.80)
WBC: 14.5 10*3/uL — ABNORMAL HIGH (ref 3.8–10.8)

## 2022-10-05 LAB — SEDIMENTATION RATE: Sed Rate: 22 mm/h — ABNORMAL HIGH (ref 0–20)

## 2022-10-05 LAB — FUNGUS CULTURE RESULT

## 2022-10-05 LAB — FUNGAL ORGANISM REFLEX

## 2022-10-05 LAB — C-REACTIVE PROTEIN: CRP: 8.6 mg/L — ABNORMAL HIGH (ref <8.0)

## 2022-10-05 NOTE — Telephone Encounter (Signed)
Per Pam with [employer] patient will get first dose Friday 5/31. Patient understands someone from [facility] will call him to setup time for first dose and teaching.  Patient did not have any questions when called with update. Reminded him of upcoming appt on 5/30 for picc line at [facility] IR. Juanita Laster, RMA

## 2022-10-06 ENCOUNTER — Telehealth (HOSPITAL_COMMUNITY): Payer: Self-pay | Admitting: Radiology

## 2022-10-06 NOTE — Telephone Encounter (Signed)
Patient called with questions about his PICC appt that is scheduled and the medications. I have sent a secure chat to Leitha Schuller with ID to call pt back to discuss. JM

## 2022-10-12 ENCOUNTER — Ambulatory Visit (HOSPITAL_COMMUNITY)
Admission: RE | Admit: 2022-10-12 | Discharge: 2022-10-12 | Disposition: A | Discharge status: Home / Self Care | Payer: Medicare Other | Source: Ambulatory Visit | Attending: Infectious Diseases | Admitting: Infectious Diseases

## 2022-10-12 DIAGNOSIS — Y839 Surgical procedure, unspecified as the cause of abnormal reaction of the patient, or of later complication, without mention of misadventure at the time of the procedure: Secondary | ICD-10-CM | POA: Diagnosis not present

## 2022-10-12 DIAGNOSIS — T8459XA Infection and inflammatory reaction due to other internal joint prosthesis, initial encounter: Secondary | ICD-10-CM | POA: Insufficient documentation

## 2022-10-12 DIAGNOSIS — Z79899 Other long term (current) drug therapy: Secondary | ICD-10-CM | POA: Insufficient documentation

## 2022-10-12 MED ORDER — LIDOCAINE HCL 1 % IJ SOLN
INTRAMUSCULAR | Status: AC
  Filled 2022-10-12: qty 20

## 2022-10-12 MED ORDER — HEPARIN SOD (PORK) LOCK FLUSH 100 UNIT/ML IV SOLN
INTRAVENOUS | Status: AC
  Filled 2022-10-12: qty 5

## 2022-10-12 MED ORDER — LIDOCAINE HCL 1 % IJ SOLN
20.0000 mL | Freq: Once | INTRAMUSCULAR | Status: AC
  Administered 2022-10-12: 10 mL via INTRADERMAL

## 2022-10-12 NOTE — Procedures (Signed)
PROCEDURE SUMMARY:  Successful placement of image-guided single lumen PICC line to the right basilic vein. Length 39 cm. Tip at lower SVC/RA. No complications. EBL = < 3 ml. Ready for use.  Please see imaging section of Epic for full dictation.   Mickie Kay, NP 10/12/2022 2:47 PM

## 2022-10-18 ENCOUNTER — Telehealth: Payer: Self-pay

## 2022-10-18 NOTE — Telephone Encounter (Signed)
Received call from [place], OT, wanting to know if there are any restrictions in regard to patient's physical therapy. He had PICC line placed in right arm, which is the affected shoulder post-surgery. She wanted to know if there are any restrictions to stretching and PT for the shoulder with regard to the PICC line being in the same extremity.   P: [phone]  Sandie Ano, RN

## 2022-10-18 NOTE — Telephone Encounter (Signed)
Please have them contact Dax Varky's office ( surgeon who operated).

## 2022-10-18 NOTE — Telephone Encounter (Signed)
Left message on [facility] secure voicemail per Dr. Elinor Parkinson contact surgeon office Dr. Rosalita Levan and to call our office back if she had any other questions.

## 2022-10-19 ENCOUNTER — Telehealth: Payer: Self-pay

## 2022-10-19 ENCOUNTER — Other Ambulatory Visit: Payer: Self-pay

## 2022-10-19 ENCOUNTER — Encounter: Payer: Self-pay | Admitting: Infectious Diseases

## 2022-10-19 ENCOUNTER — Ambulatory Visit: Payer: Medicare Other | Admitting: Infectious Diseases

## 2022-10-19 VITALS — BP 161/80 | HR 79 | Resp 16 | Ht 69.0 in | Wt 242.0 lb

## 2022-10-19 DIAGNOSIS — Z79899 Other long term (current) drug therapy: Secondary | ICD-10-CM

## 2022-10-19 DIAGNOSIS — Z452 Encounter for adjustment and management of vascular access device: Secondary | ICD-10-CM | POA: Diagnosis not present

## 2022-10-19 DIAGNOSIS — Z96611 Presence of right artificial shoulder joint: Secondary | ICD-10-CM | POA: Diagnosis not present

## 2022-10-19 DIAGNOSIS — T8450XD Infection and inflammatory reaction due to unspecified internal joint prosthesis, subsequent encounter: Secondary | ICD-10-CM

## 2022-10-19 DIAGNOSIS — T8450XA Infection and inflammatory reaction due to unspecified internal joint prosthesis, initial encounter: Secondary | ICD-10-CM | POA: Diagnosis present

## 2022-10-19 LAB — CBC
MCHC: 34.4 g/dL (ref 32.0–36.0)
MCV: 85.6 fL (ref 80.0–100.0)
MPV: 9.8 fL (ref 7.5–12.5)
Platelets: 221 10*3/uL (ref 140–400)
RBC: 4.59 10*6/uL (ref 4.20–5.80)
WBC: 12.7 10*3/uL — ABNORMAL HIGH (ref 3.8–10.8)

## 2022-10-19 NOTE — Telephone Encounter (Signed)
Received call from [facility], patient's home health nurse. She is unable to detach his antibiotic from the PICC line to draw labs. He is coming in later this morning for an appointment. Will attempt to detach infusion tubing from from PICC line and draw labs at his appointment.   P: [phone]  Sandie Ano, RN

## 2022-10-19 NOTE — Progress Notes (Signed)
PICC line hooked to infusion, no neutral pressure cap present. Disconnected patient from infusion, line flushed. Labs drawn via PICC per Dr. Elinor Parkinson. Line flushed with 10 mL normal saline.   Neutral pressure cap placed, patient connected to antibiotic infusion.   Kenneth Ano, RN

## 2022-10-19 NOTE — Progress Notes (Signed)
Patient Active Problem List   Diagnosis Date Noted   Medication management 10/04/2022   Prosthetic joint infection (HCC) 10/04/2022   Encounter for assessment of peripherally inserted central venous catheter (PICC) 10/04/2022   Status post reverse total arthroplasty of right shoulder 09/06/2022   Acute pain of right shoulder 08/11/2022   Synovial cyst of left popliteal space 08/11/2022   Fracture of left tibial plateau 08/06/2022   Need for influenza vaccination 03/01/2021   Pustular inflammation of skin 12/01/2020   Decreased visual acuity 12/01/2020   Moderately increased albuminuria 12/01/2020   Diabetes mellitus without complication (HCC) 10/27/2020   Vitreomacular adhesion of both eyes 10/27/2020   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 10/27/2020   Exudative age-related macular degeneration of left eye with active choroidal neovascularization (HCC) 10/27/2020   Diabetes (HCC) 09/01/2020   Retinal tear 09/01/2020   Headache(784.0) 01/16/2012   Atypical nevus 11/07/2011   Trigger finger 05/22/2011   Bronchitis 05/22/2011   Preop cardiovascular exam 04/11/2011   Knee pain 02/24/2011   Bursitis of elbow 10/31/2010   OBESITY 04/28/2010   INCONTINENCE, URGE 04/28/2010   LEG PAIN, BILATERAL 12/08/2009   CHEST PAIN 12/08/2009   ELECTROCARDIOGRAM, ABNORMAL 12/08/2009   NEOPLASM, SKIN, UNCERTAIN BEHAVIOR 12/06/2009   PREMATURE ATRIAL CONTRACTIONS 12/06/2009   COLONIC POLYPS, ADENOMATOUS, HX OF 11/17/2009   ERECTILE DYSFUNCTION, 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   ALLOPURINOL (ZYLOPRIM) 300 MG TABLET    Take 300 mg by mouth every 12 (twelve) hours as needed (gout).   AMLODIPINE (NORVASC) 10 MG TABLET    Take 1 tablet (10 mg total) by mouth daily.   ASPIRIN (ASPIRIN CHILDRENS) 81 MG CHEWABLE TABLET    Chew 1 tablet (81 mg total) by mouth 2 (two) times daily. For 6 weeks for DVT prophylaxis after surgery   DICLOFENAC SODIUM (VOLTAREN) 1 % GEL    Apply 2 g topically 4 (four) times daily.   GABAPENTIN (NEURONTIN) 300 MG CAPSULE    Take 1 tab before bed x 2 weeks.  May increase to 1 tab at dinner for 2 weeks and then may increase to am, dinner and before bed thereafter for pain.   HYDROCODONE-ACETAMINOPHEN (NORCO) 5-325 MG TABLET    Take 1-2 tablets by mouth every 6 (six) hours as needed for severe pain.   LINEZOLID (ZYVOX) 600 MG TABLET    Take 1 tablet (600 mg total) by mouth 2 (two) times daily.   LINZESS 145 MCG CAPS CAPSULE    Take 145 mcg by mouth as needed (constipation).   LISINOPRIL (PRINIVIL,ZESTRIL) 40 MG TABLET    Take 1 tablet (40 mg total) by mouth daily.   METFORMIN (GLUCOPHAGE) 850 MG TABLET    Take 1 tablet (850 mg total) by mouth daily.   MIRTAZAPINE (REMERON) 15 MG TABLET    Take 15 mg by mouth at bedtime.   MULTIPLE VITAMINS-MINERALS (PRESERVISION AREDS PO)    Take 1 tablet by mouth 2 (two) times daily.   SILDENAFIL (VIAGRA) 100  MG TABLET    Take 1 tablet (100 mg total) by mouth as needed for erectile dysfunction.   SIMVASTATIN (ZOCOR) 20 MG TABLET    Take 1 tablet (20 mg total) by mouth daily.   TEMAZEPAM (RESTORIL) 15 MG CAPSULE    Take 15 mg by mouth at bedtime.  Modified Medications   No medications on file  Discontinued Medications   No medications on file    Subjective: 82 Y O male with PMH as below including legal blindness, hypertension, hyperlipidemia, diabetes mellitus, gout, peripheral neuropathy,  gait instability with recurrent falls, Bilateral  shoulder arthroplasties (  right shoulder reverse shoulder arthroplasty done with [employer] implants about four years ago in [place], left reverse total shoulder arthroplasty done by Dr. Beverely Low that has done well for about the last 10 years) who is referred from Ortho for concerns of rt shoulder PJI. S/p (09/06/22)Revision of reverse rt shoulder arthroplasty, open synovectomy and open reduction of rt shoulder dislocation. OR cx 2/3 samples rare proprionibacterium acnes. Fungal stain negative, cx pending. Patient was admitted 3/24-3/29 for fall. Orthopedics was consulted for left toe, left lateral tibial plateaue # and left lateral malleolus  # and was advised conservative management.   3/24 CT rt shoulder: IMPRESSION: 1. 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 2. Moderate-large volume of fluid within the subacromial-subdeltoid bursal space suggesting bursitis.  He did not have any fevers, chills or concerns of infection at the rt shoulder prior to the fall. Denies taking abtx prior to visit. He has no pain, tenderness, swelling at rt shoulder except limitation of ROM. Seen by Ortho yesterday. He had Vascular US of LLE today which was negative for DVT.   I spoke with Daughter [name] per patient's request to discuss plan of care. She said she will be back to [facility] by Sunday and will help  her father with receiving IV abtx at home. Patient lives in an apartment and also has a caretaker with him. Reports being legally blind but able to count fingers. Denies smoking, alcohol and IVDU. He has no complaints otherwise.   10/19/22 Daughter stephanie with patient, started IV penicillin from 5/31. Taking linezolid prior to that without missing doses. Denies any concerns related to PICC but [facility] nurse was unable to change pump and Pam with [employer] will be changing in the clinic. Denies fevers, chills. Denies nausea, vomiting,  diarrhea. Daughters have been rotating to help with PICC and IV abtx at home. He is also doing PT. Doing well with no concerns otherwise.   Review of Systems: all systems reviewed with pertinent positives and negatives as listed above   Past Medical History:  Diagnosis Date   Abnormal involuntary movements(781.0)    Arthritis    Chicken pox    Claudication (HCC)    Colon polyps    Diabetes mellitus type II    Erectile dysfunction    Hyperlipidemia    Hypertension    Neoplasm of skin    uncertain behavior   Past Surgical History:  Procedure Laterality Date   CARPAL TUNNEL RELEASE     right   HAND SURGERY     KNEE SURGERY     MINOR HARDWARE REMOVAL Right 09/06/2022   Procedure: MINOR HARDWARE REMOVAL;  Surgeon: Bjorn Pippin, MD;  Location: [facility];  Service: Orthopedics;  Laterality: Right;   NECK SURGERY     ORIF HUMERUS FRACTURE Right 09/06/2022   Procedure: OPEN REDUCTION INTERNAL FIXATION (ORIF)  PROXIMAL HUMERUS FRACTURE;  Surgeon: Bjorn Pippin, MD;  Location: [facility];  Service: Orthopedics;  Laterality: Right;   SHOULDER SURGERY Right    2020   SHOULDER SURGERY Left    2011   TOTAL SHOULDER REVISION Right 09/06/2022   Procedure: TOTAL SHOULDER REVISION;  Surgeon: Bjorn Pippin, MD;  Location: [facility];  Service: Orthopedics;  Laterality: Right;    Social History   Tobacco Use   Smoking status: Former    Types: Cigarettes    Quit date: 05/15/2009    Years since quitting: 13.4   Smokeless tobacco: Never  Substance Use Topics   Alcohol use: Yes    Alcohol/week: 3.0 standard drinks of alcohol    Types: 1 Glasses of wine, 1 Cans of beer, 1 Shots of liquor per week    Comment: social   Drug use: No    Family History  Problem Relation Age of Onset   Hypertension Mother    Heart disease Mother    Stroke Father    Colon cancer Neg Hx    Stomach cancer Neg Hx    Breast cancer Sister    Colon polyps Sister     Allergies  Allergen Reactions   Doxycycline Nausea And  Vomiting   Oxycodone-Acetaminophen Itching    Health Maintenance  Topic Date Due   OPHTHALMOLOGY EXAM  07/07/2021   FOOT EXAM  09/01/2021   COVID-19 Vaccine (6 - 2023-24 season) 01/13/2022   Diabetic kidney evaluation - Urine ACR  03/01/2022   INFLUENZA VACCINE  12/14/2022   HEMOGLOBIN A1C  02/07/2023   Medicare Annual Wellness (AWV)  06/21/2023   Diabetic kidney evaluation - eGFR measurement  10/04/2023   DTaP/Tdap/Td (5 - Td or Tdap) 03/14/2028   Pneumonia Vaccine 29+ Years old  Completed   Zoster Vaccines- Shingrix  Completed   HPV VACCINES  Aged Out   Colonoscopy  Discontinued    Objective: BP (!) 161/80   Pulse 79   Resp 16   Ht 5\' 9"  (1.753 m)   Wt 242 lb (109.8 kg)   SpO2 96%   BMI 35.74 kg/m   Physical Exam Constitutional:      Appearance: Normal appearance. Morbidly obese, ambulatory with a cane.  HENT:     Head: Normocephalic and atraumatic.      Mouth: Mucous membranes are moist.  Eyes:    Conjunctiva/sclera: Conjunctivae normal.     Pupils: Pupils are equal, round, and bilaterally symmetrical   Cardiovascular:     Rate and Rhythm: Normal rate and regular rhythm.     Heart sounds: s1s2  Pulmonary:     Effort: Pulmonary effort is normal.     Breath sounds: Normal breath sounds.   Abdominal:     General: Non distended     Palpations: soft.   Musculoskeletal:        General:  RT shoulder reverse shoulder arthroplasty surgical site has healed. He cannot fully extend his rt shoulder beyond 90 degrees but has reasonable ROM. ambulatory with a cane. PICC in rt arm with no concerns   Skin:    General: Skin is warm and dry.     Comments:  Neurological:     General: grossly non focal     Mental Status: awake, alert and oriented to person, place, and time.   Psychiatric:        Mood and Affect: Mood normal.   Lab Results Lab Results  Component Value Date   WBC 14.5 (H)  10/04/2022   HGB 13.5 10/04/2022   HCT 40.5 10/04/2022   MCV 86.7 10/04/2022    PLT 327 10/04/2022    Lab Results  Component Value Date   CREATININE 0.76 10/04/2022   BUN 13 10/04/2022   NA 135 10/04/2022   K 4.3 10/04/2022   CL 99 10/04/2022   CO2 21 10/04/2022    Lab Results  Component Value Date   ALT 15 10/04/2022   AST 14 10/04/2022   ALKPHOS 222 (H) 08/06/2022   BILITOT 0.3 10/04/2022    Lab Results  Component Value Date   CHOL 182 02/24/2011   HDL 74.60 02/24/2011   LDLCALC 92 02/24/2011   TRIG 78.0 02/24/2011   CHOLHDL 2 02/24/2011   No results found for: "LABRPR", "RPRTITER" No results found for: "HIV1RNAQUANT", "HIV1RNAVL", "CD4TABS"  Microbiology Results for orders placed or performed during the hospital encounter of 09/06/22  Surgical pcr screen     Status: Abnormal   Collection Time: 09/06/22 10:14 AM   Specimen: Nasal Mucosa; Nasal Swab  Result Value Ref Range Status   MRSA, PCR NEGATIVE NEGATIVE Final   Staphylococcus aureus POSITIVE (A) NEGATIVE Final    Comment: (NOTE) The Xpert SA Assay (FDA approved for NASAL specimens in patients 56 years of age and older), is one component of a comprehensive surveillance program. It is not intended to diagnose infection nor to guide or monitor treatment. Performed at [facility], [address], [place], [place] [address]   Fungus Culture With Stain     Status: None   Collection Time: 09/06/22  1:40 PM   Specimen: Synovial, Right Shoulder; Body Fluid  Result Value Ref Range Status   Fungus Stain Final report  Final   Fungus (Mycology) Culture Final report  Final    Comment: (NOTE) Performed At: [facility] [address] [place], [place] [id number] Jolene Schimke MD QI:[id number]    Fungal Source TISSUE  Final    Comment: Performed at [facility], [address] [place], [place] [address]  Fungus Culture With Stain     Status: None   Collection Time: 09/06/22  1:40 PM   Specimen: Synovial, Right Shoulder; Body Fluid  Result  Value Ref Range Status   Fungus Stain Final report  Final   Fungus (Mycology) Culture Final report  Final    Comment: (NOTE) Performed At: [facility] [address] [place], [place] [id number] Jolene Schimke MD YQ:[id number]    Fungal Source TISSUE  Final    Comment: Performed at [facility], [address] [place], [place] [address]  Fungus Culture With Stain     Status: None   Collection Time: 09/06/22  1:40 PM   Specimen: Synovial, Right Shoulder; Body Fluid  Result Value Ref Range Status   Fungus Stain Final report  Final   Fungus (Mycology) Culture Final report  Final    Comment: (NOTE) Performed At: [facility] [address] [place], [place] [id number] Jolene Schimke MD CZ:[id number]    Fungal Source TISSUE  Final    Comment: Performed at [facility], [address], [place], [place] [address]  Aerobic/Anaerobic Culture w Gram Stain (surgical/deep wound)     Status: None   Collection Time: 09/06/22  1:40 PM   Specimen: Synovial, Right Shoulder; Body Fluid  Result Value Ref Range Status   Specimen Description   Final    TISSUE Performed at [facility], [address] [place],  [place] [address]    Special Requests R SHOULDER HOLD 21 DAYS  Final   Gram Stain NO WBC SEEN NO ORGANISMS SEEN   Final   Culture   Final    NO GROWTH 21 DAYS CONTINUING TO HOLD Performed at [facility], [address], [place], [place] [address]    Report Status 09/27/2022 FINAL  Final  Aerobic/Anaerobic Culture w Gram Stain (surgical/deep wound)     Status: None   Collection Time: 09/06/22  1:40 PM   Specimen: Synovial, Right Shoulder; Body Fluid  Result Value Ref Range Status   Specimen Description   Final    TISSUE Performed at [facility], [address], [place], [place] [address]    Special Requests   Final    R SHOULDER Performed at [facility], [address], [place], [place] [address]    Gram Stain NO WBC SEEN NO ORGANISMS SEEN   Final   Culture   Final    RARE PROPIONIBACTERIUM ACNES Standardized susceptibility testing for this organism is not available. Performed at [facility], [address] [place], [place] [address]    Report Status 09/11/2022 FINAL  Final  Aerobic/Anaerobic Culture w Gram Stain (surgical/deep wound)     Status: None   Collection Time: 09/06/22  1:40 PM   Specimen: Synovial, Right Shoulder; Body Fluid  Result Value Ref Range Status   Specimen Description   Final    TISSUE Performed at [facility], [address] [place], [place] [address]    Special Requests   Final    R SHOULDER Performed at [facility], [address] [place], [place] [address]    Gram Stain NO WBC SEEN NO ORGANISMS SEEN   Final   Culture   Final    RARE PROPIONIBACTERIUM ACNES NO ANAEROBES ISOLATED CRITICAL RESULT CALLED TO, READ BACK BY AND VERIFIED WITH: RN [email or web address].P AT 0913 ON 09/25/2022 BY T.SAAD Performed at [facility], [address] [place], [place] [address]    Report Status 09/25/2022 FINAL  Final  Fungus Culture Result     Status: None   Collection Time: 09/06/22  1:40 PM  Result Value Ref Range Status   Result 1 Comment  Final    Comment: (NOTE) KOH/Calcofluor preparation:  no fungus observed. Performed At: [facility] [address] [place], [place] [id number] Jolene Schimke MD NU:[id number]   Fungus Culture Result     Status: None   Collection Time: 09/06/22  1:40 PM  Result Value Ref Range Status   Result 1 Comment  Final    Comment: (NOTE) KOH/Calcofluor preparation:  no fungus observed. Performed At: [facility] [address] [place], [place] [id number] Jolene Schimke MD LO:[id number]   Fungus Culture Result     Status: None   Collection Time: 09/06/22  1:40 PM  Result Value Ref Range Status   Result 1 Comment  Final     Comment: (NOTE) KOH/Calcofluor preparation:  no fungus observed. Performed At: [facility] [address] [place], [place] [id number] Jolene Schimke MD KZ:[id number]   Fungal organism reflex     Status: None   Collection Time: 09/06/22  1:40 PM  Result Value Ref Range Status   Fungal result 1 Comment  Final    Comment: (NOTE) No yeast or mold isolated after 4 weeks. Performed At: [facility] [address] [place], [place] [id number] Jolene Schimke MD HC:[id number]  Fungal organism reflex     Status: None   Collection Time: 09/06/22  1:40 PM  Result Value Ref Range Status   Fungal result 1 Comment  Final    Comment: (NOTE) No yeast or mold isolated after 4 weeks. Performed At: [facility] [address] [place], [place] [id number] Jolene Schimke MD WU:[id number]   Fungal organism reflex     Status: None   Collection Time: 09/06/22  1:40 PM  Result Value Ref Range Status   Fungal result 1 Comment  Final    Comment: (NOTE) No yeast or mold isolated after 4 weeks. Performed At: [facility] [address] [place], [place] [id number] Jolene Schimke MD VH:[id number]    Imaging IR PICC PLACEMENT RIGHT >5 YRS INC IMG GUIDE  Result Date: 10/12/2022 INDICATION: Patient with right shoulder infection requiring long term IV antibiotics. Interventional Radiology asked to place a PICC. EXAM: ULTRASOUND AND FLUOROSCOPIC GUIDED PICC LINE INSERTION MEDICATIONS: 1% lidocaine, 2 ml CONTRAST:  None FLUOROSCOPY TIME:  Radiation exposure index as provided by the fluoroscopic device: 7 mGy air kerma COMPLICATIONS: None immediate. TECHNIQUE: The procedure, risks, benefits, and alternatives were explained to the patient and informed written consent was obtained. The right upper extremity was prepped with chlorhexidine in a sterile fashion, and a sterile drape was applied covering the operative field. Maximum barrier sterile technique with sterile gowns and gloves  were used for the procedure. A timeout was performed prior to the initiation of the procedure. Local anesthesia was provided with 1% lidocaine. After the overlying soft tissues were anesthetized with 1% lidocaine, a micropuncture kit was utilized to access the right basilic vein. Real-time ultrasound guidance was utilized for vascular access including the acquisition of a permanent ultrasound image documenting patency of the accessed vessel. A guidewire was advanced to the level of the superior caval-atrial junction for measurement purposes and the PICC line was cut to length. A peel-away sheath was placed and a 39 cm, 5 [place], single lumen was inserted to level of the superior caval-atrial junction. A post procedure spot fluoroscopic was obtained. The catheter easily aspirated and flushed and was secured in place. A dressing was placed. The patient tolerated the procedure well without immediate post procedural complication. FINDINGS: After catheter placement, the tip lies within the superior cavoatrial junction. The catheter aspirates and flushes normally and is ready for immediate use. IMPRESSION: Successful ultrasound and fluoroscopic guided placement of a right basilic vein approach, 39 cm, 5 French, single lumen PICC with tip at the superior caval-atrial junction. The PICC line is ready for immediate use. Procedure performed by: Alwyn Ren, NP Electronically Signed   By: Richarda Overlie M.D.   On: 10/12/2022 17:07   VAS [place] LOWER EXTREMITY VENOUS (DVT)  Result Date: 10/04/2022  Lower Venous DVT Study Patient Name:  Kenneth Henson  Date of Exam:   10/04/2022 Medical Rec #: [id number]         Accession #:    [id number] Date of Birth: May 04, 1941         Patient Gender: M Patient Age:   48 years Exam Location:  [facility] Procedure:      VAS [place] LOWER EXTREMITY VENOUS (DVT) Referring Phys: Ramond Marrow --------------------------------------------------------------------------------  Other Indications: Patient  presents today with left knee pain that has been                    going on for a few months. He had a fall at the end of  March                    in which he injured his shoulder and fractured the left                    tibial plateau. Evaluate for DVT. Performing Technologist: Olegario Shearer RVT  Examination Guidelines: A complete evaluation includes B-mode imaging, spectral Doppler, color Doppler, and power Doppler as needed of all accessible portions of each vessel. Bilateral testing is considered an integral part of a complete examination. Limited examinations for reoccurring indications may be performed as noted. The reflux portion of the exam is performed with the patient in reverse Trendelenburg.  +-----+---------------+---------+-----------+----------+--------------+ RIGHTCompressibilityPhasicitySpontaneityPropertiesThrombus Aging +-----+---------------+---------+-----------+----------+--------------+ CFV  Full           Yes      Yes                                 +-----+---------------+---------+-----------+----------+--------------+   +---------+---------------+---------+-----------+----------+--------------+ LEFT     CompressibilityPhasicitySpontaneityPropertiesThrombus Aging +---------+---------------+---------+-----------+----------+--------------+ CFV      Full           Yes      Yes                                 +---------+---------------+---------+-----------+----------+--------------+ SFJ      Full           Yes      Yes                                 +---------+---------------+---------+-----------+----------+--------------+ FV Prox  Full           Yes      Yes                                 +---------+---------------+---------+-----------+----------+--------------+ FV Mid   Full           Yes      Yes                                 +---------+---------------+---------+-----------+----------+--------------+ FV DistalFull           Yes       Yes                                 +---------+---------------+---------+-----------+----------+--------------+ PFV      Full                                                        +---------+---------------+---------+-----------+----------+--------------+ POP      Full           Yes      Yes                                 +---------+---------------+---------+-----------+----------+--------------+ PTV      Full  Yes      Yes                                 +---------+---------------+---------+-----------+----------+--------------+ PERO     Full           Yes      Yes                                 +---------+---------------+---------+-----------+----------+--------------+ Gastroc  Full                                                        +---------+---------------+---------+-----------+----------+--------------+ GSV      Full           Yes      Yes                                 +---------+---------------+---------+-----------+----------+--------------+   Findings reported to [facility] at [facility] at 11:20 am.  Summary: RIGHT: - No evidence of common femoral vein obstruction.  LEFT: - No evidence of deep vein thrombosis in the lower extremity. No indirect evidence of obstruction proximal to the inguinal ligament. - No cystic structure found in the popliteal fossa.  *See table(s) above for measurements and observations. Electronically signed by Lemar Livings MD on 10/04/2022 at 5:05:59 PM.    Final     Assessment/Plan 61 Y O male with PMH as below including legal blindness, hypertension, hyperlipidemia, diabetes mellitus, gout, peripheral neuropathy,  gait instability with recurrent falls, Bilateral shoulder arthroplasties (  right shoulder reverse shoulder arthroplasty done with [employer] implants about four years ago in [place], left reverse total shoulder arthroplasty done by Dr. Beverely Low that has done well for about the last 10  years) with   # Rt shoulder PJI  # Medication management  - s/p rt revision reverse shoulder arthroplasty, open synovectomy and open treatment of shoulder dislocation. 2/3 Cx Proprionibacterium acnes, Fungal stain negative, fungal cx pending  - PICC placed 5/30 - Start date for IV pen G 5/31. On linezolid prior to that.  - Plan for 6 weeks of Pen G as in OPAT. EOT 11/24/22 - Fu in 4-5 weeks prior to EOT for possible switch to PO abtx  # PICC  -  no concerns   # Medication monitoring  - labs today   OPAT  Diagnosis: rt shoulder PJI   Culture Result: Proprionibacterium acnes   Allergies  Allergen Reactions   Doxycycline Nausea And Vomiting   Oxycodone-Acetaminophen Itching    OPAT Orders Discharge antibiotics to be given via PICC line Discharge antibiotics: Penicillin G 20 million units continuous daily infusion  Per pharmacy protocol  Duration: 6 weeks  End Date: EOT 11/24/22  PIC Care Per Protocol:  Home health RN for IV administration and teaching; PICC line care and labs.    Labs weekly while on IV antibiotics: X__ CBC with differential __ BMP X__ CMP X__ CRP X__ ESR __ Vancomycin trough __ CK  X__ Please pull PIC at completion of IV antibiotics __ Please leave PIC in place until doctor has seen patient or been notified  Fax weekly  labs to [phone]  Clinic Follow Up Appt: 4-5 weeks   I have personally spent 40  minutes involved in face-to-face and non-face-to-face activities for this patient on the day of the visit. Professional time spent includes the following activities: Preparing to see the patient (review of tests), Obtaining and/or reviewing separately obtained history (admission/discharge record), Performing a medically appropriate examination and/or evaluation , Ordering medications/tests/procedures, referring and communicating with other health care professionals, Documenting clinical information in the EMR, Independently interpreting results (not  separately reported), Communicating results to the patient/family/caregiver, Counseling and educating the patient/family/caregiver and Care coordination (not separately reported).   Victoriano Lain, MD [facility] 10/19/2022, 11:27 AM

## 2022-10-20 ENCOUNTER — Telehealth: Payer: Self-pay

## 2022-10-20 LAB — BASIC METABOLIC PANEL
BUN/Creatinine Ratio: 6 (calc) (ref 6–22)
BUN: 5 mg/dL — ABNORMAL LOW (ref 7–25)
CO2: 23 mmol/L (ref 20–32)
Calcium: 9.2 mg/dL (ref 8.6–10.3)
Chloride: 95 mmol/L — ABNORMAL LOW (ref 98–110)
Creat: 0.79 mg/dL (ref 0.70–1.22)
Glucose, Bld: 96 mg/dL (ref 65–99)
Potassium: 4.6 mmol/L (ref 3.5–5.3)
Sodium: 131 mmol/L — ABNORMAL LOW (ref 135–146)

## 2022-10-20 LAB — CBC
HCT: 39.3 % (ref 38.5–50.0)
Hemoglobin: 13.5 g/dL (ref 13.2–17.1)
MCH: 29.4 pg (ref 27.0–33.0)
RDW: 14 % (ref 11.0–15.0)

## 2022-10-20 LAB — C-REACTIVE PROTEIN: CRP: 20.7 mg/L — ABNORMAL HIGH (ref <8.0)

## 2022-10-20 LAB — SEDIMENTATION RATE: Sed Rate: 22 mm/h — ABNORMAL HIGH (ref 0–20)

## 2022-10-20 NOTE — Telephone Encounter (Signed)
Spoke with Axil, relayed that labs were unremarkable except for elevated inflammatory markers. He asked what could cause that, discussed that it's common to see elevated inflammatory markers in patients being treated for infection. Patient verbalized understanding and has no further questions.   Sandie Ano, RN

## 2022-10-20 NOTE — Telephone Encounter (Signed)
-----   Message from Odette Fraction, MD sent at 10/20/2022  8:44 AM EDT ----- Please let patient know labs are overall unremarkable except some elevation on inflammatory markers.

## 2022-10-27 DIAGNOSIS — Z452 Encounter for adjustment and management of vascular access device: Secondary | ICD-10-CM | POA: Insufficient documentation

## 2022-11-14 ENCOUNTER — Telehealth (HOSPITAL_BASED_OUTPATIENT_CLINIC_OR_DEPARTMENT_OTHER): Payer: Self-pay | Admitting: Family Medicine

## 2022-11-14 NOTE — Telephone Encounter (Signed)
RN Development worker, international aid with [facility], calling to get skilled nursing order approval please return her call

## 2022-11-15 ENCOUNTER — Telehealth: Payer: Self-pay

## 2022-11-15 NOTE — Telephone Encounter (Signed)
Received voice message in triage from Diane Development worker, international aid for [facility]. Recert due for RN home health visits. Needing orders to continue manage picc line and draw lab through duration of OPAT. Message left with call service member Melody and she will pass the message along to return office call for orders. Awaiting a call back for nurse to give orders.   Valarie Cones, LPN

## 2022-11-15 NOTE — Telephone Encounter (Signed)
Per Dr. Elinor Parkinson ok to continue with nursing care for picc care and lab draws. Spoke with Diane and relayed orders. Order read back and understood.  Valarie Cones, LPN

## 2022-11-20 ENCOUNTER — Telehealth: Payer: Self-pay | Admitting: Infectious Diseases

## 2022-11-20 NOTE — Telephone Encounter (Signed)
Mcadoo's daughter, [name], called hoping to confirm if the upcoming appointment is for the removal of his PICC line. Both can be reached at [phone].

## 2022-11-20 NOTE — Telephone Encounter (Signed)
Informed patient daughter [name] that Dr.Manandhar will decide at appointment 7/11 if PICC can be removed. Home Health nurse will remove PICC line if so.    Anjani Feuerborn Lesli Albee, CMA

## 2022-11-21 ENCOUNTER — Telehealth (HOSPITAL_BASED_OUTPATIENT_CLINIC_OR_DEPARTMENT_OTHER): Payer: Self-pay | Admitting: Family Medicine

## 2022-11-21 NOTE — Telephone Encounter (Signed)
[phone]   Kenneth Henson with [facility] calling to see if patient can get a freestyle libra

## 2022-11-21 NOTE — Telephone Encounter (Signed)
nikisha with [facility]  was called back in regards to patient. She was inform that I will forward message over in regards to pt getting a new prescription sent in.

## 2022-11-23 ENCOUNTER — Telehealth: Payer: Self-pay

## 2022-11-23 ENCOUNTER — Other Ambulatory Visit: Payer: Self-pay

## 2022-11-23 ENCOUNTER — Ambulatory Visit: Payer: Medicare Other | Admitting: Infectious Diseases

## 2022-11-23 ENCOUNTER — Encounter: Payer: Self-pay | Admitting: Infectious Diseases

## 2022-11-23 VITALS — BP 161/76 | HR 82 | Temp 98.5°F | Ht 69.0 in | Wt 249.0 lb

## 2022-11-23 DIAGNOSIS — Z79899 Other long term (current) drug therapy: Secondary | ICD-10-CM | POA: Diagnosis not present

## 2022-11-23 DIAGNOSIS — Z452 Encounter for adjustment and management of vascular access device: Secondary | ICD-10-CM

## 2022-11-23 DIAGNOSIS — T8450XD Infection and inflammatory reaction due to unspecified internal joint prosthesis, subsequent encounter: Secondary | ICD-10-CM

## 2022-11-23 MED ORDER — AMOXICILLIN 500 MG PO TABS
500.0000 mg | ORAL_TABLET | Freq: Three times a day (TID) | ORAL | 5 refills | Status: DC
Start: 2022-11-23 — End: 2022-11-23

## 2022-11-23 MED ORDER — AMOXICILLIN 500 MG PO TABS
500.0000 mg | ORAL_TABLET | Freq: Three times a day (TID) | ORAL | 5 refills | Status: DC
Start: 2022-11-23 — End: 2023-06-29

## 2022-11-23 NOTE — Telephone Encounter (Signed)
Called pt to schedule appt within 2-4 weeks to followup for medical conditions specific to diabetes. Memory full and unable to reach patient

## 2022-11-23 NOTE — Progress Notes (Signed)
Patient Active Problem List   Diagnosis Date Noted   Needs peripherally inserted central catheter (PICC) 10/27/2022   Prosthetic joint infection, subsequent encounter 10/19/2022   PICC (peripherally inserted central catheter) in place 10/19/2022   Medication management 10/04/2022   Encounter for assessment of peripherally inserted central venous catheter (PICC) 10/04/2022   Status post reverse total arthroplasty of right shoulder 09/06/2022   Acute pain of right shoulder 08/11/2022   Synovial cyst of left popliteal space 08/11/2022   Fracture of left tibial plateau 08/06/2022   Need for influenza vaccination 03/01/2021   Pustular inflammation of skin 12/01/2020   Decreased visual acuity 12/01/2020   Moderately increased albuminuria 12/01/2020   Diabetes mellitus without complication (HCC) 10/27/2020   Vitreomacular adhesion of both eyes 10/27/2020   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 10/27/2020   Exudative age-related macular degeneration of left eye with active choroidal neovascularization (HCC) 10/27/2020   Diabetes (HCC) 09/01/2020   Retinal tear 09/01/2020   Headache(784.0) 01/16/2012   Atypical nevus 11/07/2011   Trigger finger 05/22/2011   Bronchitis 05/22/2011   Preop cardiovascular exam 04/11/2011   Knee pain 02/24/2011   Bursitis of elbow 10/31/2010   OBESITY 04/28/2010   INCONTINENCE, URGE 04/28/2010   LEG PAIN, BILATERAL 12/08/2009   CHEST PAIN 12/08/2009   ELECTROCARDIOGRAM, ABNORMAL 12/08/2009   NEOPLASM, SKIN, UNCERTAIN BEHAVIOR 12/06/2009   PREMATURE ATRIAL CONTRACTIONS 12/06/2009   COLONIC POLYPS, ADENOMATOUS, HX OF 11/17/2009   ERECTILE DYSFUNCTION, 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   ALLOPURINOL (ZYLOPRIM) 300 MG TABLET    Take 300 mg by mouth every 12 (twelve) hours as needed (gout).   AMLODIPINE (NORVASC) 10 MG TABLET    Take 1 tablet (10 mg total) by mouth daily.   DICLOFENAC SODIUM (VOLTAREN) 1 % GEL    Apply 2 g topically 4 (four) times daily.   GABAPENTIN (NEURONTIN) 300 MG CAPSULE    Take 1 tab before bed x 2 weeks.  May increase to 1 tab at dinner for 2 weeks and then may increase to am, dinner and before bed thereafter for pain.   HYDROCODONE-ACETAMINOPHEN (NORCO) 5-325 MG TABLET    Take 1-2 tablets by mouth every 6 (six) hours as needed for severe pain.   LINEZOLID (ZYVOX) 600 MG TABLET    Take 1 tablet (600 mg total) by mouth 2 (two) times daily.   LINZESS 145 MCG CAPS CAPSULE    Take 145 mcg by mouth as needed (constipation).   LISINOPRIL (PRINIVIL,ZESTRIL) 40 MG TABLET    Take 1 tablet (40 mg total) by mouth daily.   METFORMIN (GLUCOPHAGE) 850 MG TABLET    Take 1 tablet (850 mg total) by mouth daily.   MIRTAZAPINE (REMERON) 15 MG TABLET    Take 15 mg by mouth at bedtime.   MULTIPLE VITAMINS-MINERALS (PRESERVISION AREDS PO)    Take 1 tablet by mouth 2 (two) times daily.   SILDENAFIL (VIAGRA) 100 MG TABLET    Take 1 tablet (100 mg total) by  mouth as needed for erectile dysfunction.   SIMVASTATIN (ZOCOR) 20 MG TABLET    Take 1 tablet (20 mg total) by mouth daily.   TEMAZEPAM (RESTORIL) 15 MG CAPSULE    Take 15 mg by mouth at bedtime.  Modified Medications   No medications on file  Discontinued Medications   No medications on file    Subjective: 82 Y O male with PMH as below including legal blindness, hypertension, hyperlipidemia, diabetes mellitus, gout, peripheral neuropathy,  gait instability with recurrent falls, Bilateral shoulder  arthroplasties (  right shoulder reverse shoulder arthroplasty done with [employer] implants about four years ago in [place], left reverse total shoulder arthroplasty done by Dr. Beverely Low that has done well for about the last 10 years) who is referred from Ortho for concerns of rt shoulder PJI. S/p (09/06/22)Revision of reverse rt shoulder arthroplasty, open synovectomy and open reduction of rt shoulder dislocation. OR cx 2/3 samples rare proprionibacterium acnes. Fungal stain negative, cx pending. Patient was admitted 3/24-3/29 for fall. Orthopedics was consulted for left toe, left lateral tibial plateaue # and left lateral malleolus  # and was advised conservative management.   3/24 CT rt shoulder: IMPRESSION: 1. 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 2. Moderate-large volume of fluid within the subacromial-subdeltoid bursal space suggesting bursitis.  He did not have any fevers, chills or concerns of infection at the rt shoulder prior to the fall. Denies taking abtx prior to visit. He has no pain, tenderness, swelling at rt shoulder except limitation of ROM. Seen by Ortho yesterday. He had Vascular US of LLE today which was negative for DVT.   I spoke with Daughter [name] per patient's request to discuss plan of care. She said she will be back to [facility] by Sunday and will help  her father with receiving IV abtx at home. Patient lives in an apartment and also has a caretaker with him. Reports being legally blind but able to count fingers. Denies smoking, alcohol and IVDU. He has no complaints otherwise.   Started IV penicillin from 5/31. Taking linezolid prior to that without missing doses.   11/23/22 Accompanied by daughter stephanie. Getting IV penicillin from rt arm picc. Denies any concerns related to rt shoulder/PICC. He has not seen Orthopedics yet.   Review of Systems: all systems reviewed with pertinent  positives and negatives as listed above   Past Medical History:  Diagnosis Date   Abnormal involuntary movements(781.0)    Arthritis    Chicken pox    Claudication (HCC)    Colon polyps    Diabetes mellitus type II    Erectile dysfunction    Hyperlipidemia    Hypertension    Neoplasm of skin    uncertain behavior   Past Surgical History:  Procedure Laterality Date   CARPAL TUNNEL RELEASE     right   HAND SURGERY     KNEE SURGERY     MINOR HARDWARE REMOVAL Right 09/06/2022   Procedure: MINOR HARDWARE REMOVAL;  Surgeon: Bjorn Pippin, MD;  Location: [facility];  Service: Orthopedics;  Laterality: Right;   NECK SURGERY     ORIF HUMERUS FRACTURE Right 09/06/2022   Procedure: OPEN REDUCTION INTERNAL FIXATION (ORIF) PROXIMAL HUMERUS FRACTURE;  Surgeon: Bjorn Pippin, MD;  Location: [facility];  Service: Orthopedics;  Laterality: Right;   SHOULDER SURGERY Right    2020   SHOULDER SURGERY Left    2011   TOTAL SHOULDER REVISION Right 09/06/2022  Procedure: TOTAL SHOULDER REVISION;  Surgeon: Bjorn Pippin, MD;  Location: [facility];  Service: Orthopedics;  Laterality: Right;    Social History   Tobacco Use   Smoking status: Former    Current packs/day: 0.00    Types: Cigarettes    Quit date: 05/15/2009    Years since quitting: 13.5   Smokeless tobacco: Never  Substance Use Topics   Alcohol use: Yes    Alcohol/week: 3.0 standard drinks of alcohol    Types: 1 Glasses of wine, 1 Cans of beer, 1 Shots of liquor per week    Comment: social   Drug use: No    Family History  Problem Relation Age of Onset   Hypertension Mother    Heart disease Mother    Stroke Father    Colon cancer Neg Hx    Stomach cancer Neg Hx    Breast cancer Sister    Colon polyps Sister     Allergies  Allergen Reactions   Doxycycline Nausea And Vomiting   Oxycodone-Acetaminophen Itching    Health Maintenance  Topic Date Due   OPHTHALMOLOGY EXAM  07/07/2021   FOOT EXAM  09/01/2021   COVID-19 Vaccine (6 -  2023-24 season) 01/13/2022   Diabetic kidney evaluation - Urine ACR  03/01/2022   INFLUENZA VACCINE  12/14/2022   HEMOGLOBIN A1C  02/07/2023   Medicare Annual Wellness (AWV)  06/21/2023   Diabetic kidney evaluation - eGFR measurement  10/19/2023   DTaP/Tdap/Td (5 - Td or Tdap) 03/14/2028   Pneumonia Vaccine 36+ Years old  Completed   Zoster Vaccines- Shingrix  Completed   HPV VACCINES  Aged Out   Colonoscopy  Discontinued    Objective: BP (!) 161/76   Pulse 82   Temp 98.5 F (36.9 C) (Oral)   Ht 5\' 9"  (1.753 m)   Wt 249 lb (112.9 kg)   SpO2 94%   BMI 36.77 kg/m    Physical Exam Constitutional:      Appearance: Normal appearance. Morbidly obese, ambulatory with a cane.  HENT:     Head: Normocephalic and atraumatic.      Mouth: Mucous membranes are moist.  Eyes:    Conjunctiva/sclera: Conjunctivae normal.     Pupils: Pupils are equal, round, and bilaterally symmetrical   Cardiovascular:     Rate and Rhythm: Normal rate and regular rhythm.     Heart sounds: s1s2  Pulmonary:     Effort: Pulmonary effort is normal.     Breath sounds: Normal breath sounds.   Abdominal:     General: Non distended     Palpations: soft.   Musculoskeletal:        General:  RT shoulder reverse shoulder arthroplasty surgical site has healed. Cannot fully extend his rt shoulder but has reasonable ROM. ambulatory with a cane. PICC in rt arm with no concerns   Skin:    General: Skin is warm and dry.     Comments:  Neurological:     General: grossly non focal     Mental Status: awake, alert and oriented to person, place, and time.   Psychiatric:        Mood and Affect: Mood normal.   Lab Results Lab Results  Component Value Date   WBC 12.7 (H) 10/19/2022   HGB 13.5 10/19/2022   HCT 39.3 10/19/2022   MCV 85.6 10/19/2022   PLT 221 10/19/2022    Lab Results  Component Value Date   CREATININE 0.79 10/19/2022   BUN 5 (  L) 10/19/2022   NA 131 (L) 10/19/2022   K 4.6 10/19/2022   CL  95 (L) 10/19/2022   CO2 23 10/19/2022    Lab Results  Component Value Date   ALT 15 10/04/2022   AST 14 10/04/2022   ALKPHOS 222 (H) 08/06/2022   BILITOT 0.3 10/04/2022    Lab Results  Component Value Date   CHOL 182 02/24/2011   HDL 74.60 02/24/2011   LDLCALC 92 02/24/2011   TRIG 78.0 02/24/2011   CHOLHDL 2 02/24/2011   No results found for: "LABRPR", "RPRTITER" No results found for: "HIV1RNAQUANT", "HIV1RNAVL", "CD4TABS"  Microbiology Results for orders placed or performed during the hospital encounter of 09/06/22  Surgical pcr screen     Status: Abnormal   Collection Time: 09/06/22 10:14 AM   Specimen: Nasal Mucosa; Nasal Swab  Result Value Ref Range Status   MRSA, PCR NEGATIVE NEGATIVE Final   Staphylococcus aureus POSITIVE (A) NEGATIVE Final    Comment: (NOTE) The Xpert SA Assay (FDA approved for NASAL specimens in patients 14 years of age and older), is one component of a comprehensive surveillance program. It is not intended to diagnose infection nor to guide or monitor treatment. Performed at [facility], [address], [place], [place] [address]   Fungus Culture With Stain     Status: None   Collection Time: 09/06/22  1:40 PM   Specimen: Synovial, Right Shoulder; Body Fluid  Result Value Ref Range Status   Fungus Stain Final report  Final   Fungus (Mycology) Culture Final report  Final    Comment: (NOTE) Performed At: [facility] [address] [place], [place] [id number] Jolene Schimke MD AO:[id number]    Fungal Source TISSUE  Final    Comment: Performed at [facility], [address] [place], [place] [address]  Fungus Culture With Stain     Status: None   Collection Time: 09/06/22  1:40 PM   Specimen: Synovial, Right Shoulder; Body Fluid  Result Value Ref Range Status   Fungus Stain Final report  Final   Fungus (Mycology) Culture Final report  Final    Comment: (NOTE) Performed At: [facility] [address] [place], [place] [id number] Jolene Schimke MD NU:[id number]    Fungal Source TISSUE  Final    Comment: Performed at [facility], [address] [place], [place] [address]  Fungus Culture With Stain     Status: None   Collection Time: 09/06/22  1:40 PM   Specimen: Synovial, Right Shoulder; Body Fluid  Result Value Ref Range Status   Fungus Stain Final report  Final   Fungus (Mycology) Culture Final report  Final    Comment: (NOTE) Performed At: [facility] [address] [place], [place] [id number] Jolene Schimke MD EP:[id number]    Fungal Source TISSUE  Final    Comment: Performed at [facility], [address] [place], [place] [address]  Aerobic/Anaerobic Culture w Gram Stain (surgical/deep wound)     Status: None   Collection Time: 09/06/22  1:40 PM   Specimen: Synovial, Right Shoulder; Body Fluid  Result Value Ref Range Status   Specimen Description   Final    TISSUE Performed at [facility], [address], [place], [place] [address]    Special Requests R SHOULDER HOLD 21 DAYS  Final   Gram Stain NO WBC SEEN NO ORGANISMS SEEN   Final   Culture   Final  NO GROWTH 21 DAYS CONTINUING TO HOLD Performed at [facility], [address], [place], [place] [address]    Report Status 09/27/2022 FINAL  Final  Aerobic/Anaerobic Culture w Gram Stain (surgical/deep wound)     Status: None   Collection Time: 09/06/22  1:40 PM   Specimen: Synovial, Right Shoulder; Body Fluid  Result Value Ref Range Status   Specimen Description   Final    TISSUE Performed at [facility], [address] [place], [place] [address]    Special Requests   Final    R SHOULDER Performed at [facility], [address] [place], [place] [address]    Gram Stain NO WBC SEEN NO ORGANISMS SEEN   Final   Culture   Final    RARE PROPIONIBACTERIUM ACNES Standardized  susceptibility testing for this organism is not available. Performed at [facility], [address] [place], [place] [address]    Report Status 09/11/2022 FINAL  Final  Aerobic/Anaerobic Culture w Gram Stain (surgical/deep wound)     Status: None   Collection Time: 09/06/22  1:40 PM   Specimen: Synovial, Right Shoulder; Body Fluid  Result Value Ref Range Status   Specimen Description   Final    TISSUE Performed at [facility], [address] [place], [place] [address]    Special Requests   Final    R SHOULDER Performed at [facility], [address], [place], [place] [address]    Gram Stain NO WBC SEEN NO ORGANISMS SEEN   Final   Culture   Final    RARE PROPIONIBACTERIUM ACNES NO ANAEROBES ISOLATED CRITICAL RESULT CALLED TO, READ BACK BY AND VERIFIED WITH: RN [email or web address].P AT 0913 ON 09/25/2022 BY T.SAAD Performed at [facility], [address], [place], [place] [address]    Report Status 09/25/2022 FINAL  Final  Fungus Culture Result     Status: None   Collection Time: 09/06/22  1:40 PM  Result Value Ref Range Status   Result 1 Comment  Final    Comment: (NOTE) KOH/Calcofluor preparation:  no fungus observed. Performed At: [facility] [address] [place], [place] [id number] Jolene Schimke MD CZ:[id number]   Fungus Culture Result     Status: None   Collection Time: 09/06/22  1:40 PM  Result Value Ref Range Status   Result 1 Comment  Final    Comment: (NOTE) KOH/Calcofluor preparation:  no fungus observed. Performed At: [facility] [address] [place], [place] [id number] Jolene Schimke MD UK:[id number]   Fungus Culture Result     Status: None   Collection Time: 09/06/22  1:40 PM  Result Value Ref Range Status   Result 1 Comment  Final    Comment: (NOTE) KOH/Calcofluor preparation:  no fungus observed. Performed At: [facility] [address] [place], [place] [id number] Jolene Schimke MD OH:[id number]   Fungal organism reflex     Status: None   Collection Time: 09/06/22  1:40 PM  Result Value Ref Range Status   Fungal result 1 Comment  Final    Comment: (NOTE) No yeast or mold isolated after 4 weeks. Performed At: [facility] [address] [place], [place] [id number] Jolene Schimke MD JJ:[id number]   Fungal organism reflex     Status: None   Collection Time: 09/06/22  1:40 PM  Result Value Ref Range Status   Fungal result 1 Comment  Final    Comment: (  NOTE) No yeast or mold isolated after 4 weeks. Performed At: [facility] [address] [place], [place] [id number] Jolene Schimke MD YQ:[id number]   Fungal organism reflex     Status: None   Collection Time: 09/06/22  1:40 PM  Result Value Ref Range Status   Fungal result 1 Comment  Final    Comment: (NOTE) No yeast or mold isolated after 4 weeks. Performed At: [facility] [address] [place], [place] [id number] Jolene Schimke MD RJ:[id number]    Imaging No results found.  Assessment/Plan 50 Y O male with PMH as below including legal blindness, hypertension, hyperlipidemia, diabetes mellitus, gout, peripheral neuropathy,  gait instability with recurrent falls, Bilateral shoulder arthroplasties (  right shoulder reverse shoulder arthroplasty done with [employer] implants about four years ago in [place], left reverse total shoulder arthroplasty done by Dr. Beverely Low that has done well for about the last 10 years) with   # Rt shoulder PJI  # Medication management  - s/p rt revision reverse shoulder arthroplasty, open synovectomy and open treatment of shoulder dislocation. 2/3 Cx Proprionibacterium acnes, Fungal stain negative, fungal cx pending  - PICC placed 5/30 - Start date for IV pen G 5/31. On linezolid prior to that.  - Complete 6 weeks of IV abtx on 11/24/22, start amoxicillin 500mg  po tid from 7/13, plan for 3-6 months   # PICC  -  no concerns,  coordinated with HH for removal  # Medication monitoring  - 6/19 CBC and BMP unremarkable  - 6/12 CBC and BMP unremarkable ESR 37, CRP 11    I have personally spent 41  minutes involved in face-to-face and non-face-to-face activities for this patient on the day of the visit. Professional time spent includes the following activities: Preparing to see the patient (review of tests), Obtaining and/or reviewing separately obtained history (admission/discharge record), Performing a medically appropriate examination and/or evaluation , Ordering medications/tests/procedures, referring and communicating with other health care professionals, Documenting clinical information in the EMR, Independently interpreting results (not separately reported), Communicating results to the patient/family/caregiver, Counseling and educating the patient/family/caregiver and Care coordination (not separately reported).   Victoriano Lain, MD [facility] 11/23/2022, 11:19 AM

## 2022-11-23 NOTE — Telephone Encounter (Signed)
Nykeisha with [facility] LVM requesting orders to remove patient's picc line.  I have sent a message to [employer] requesting that they send OPAT orders to [facility]. Pull picc orders also listed on the OPAT order Delsin Copen T Pricilla Loveless

## 2022-11-23 NOTE — Telephone Encounter (Signed)
Pt has made an appt with Dr. De Peru for 7/25. Closing encounter.

## 2022-11-26 DIAGNOSIS — Z452 Encounter for adjustment and management of vascular access device: Secondary | ICD-10-CM | POA: Insufficient documentation

## 2022-12-07 ENCOUNTER — Encounter (HOSPITAL_BASED_OUTPATIENT_CLINIC_OR_DEPARTMENT_OTHER): Payer: Self-pay | Admitting: Family Medicine

## 2022-12-07 ENCOUNTER — Ambulatory Visit (INDEPENDENT_AMBULATORY_CARE_PROVIDER_SITE_OTHER): Payer: Medicare Other | Admitting: Family Medicine

## 2022-12-07 DIAGNOSIS — M5441 Lumbago with sciatica, right side: Secondary | ICD-10-CM

## 2022-12-07 DIAGNOSIS — G8929 Other chronic pain: Secondary | ICD-10-CM | POA: Diagnosis not present

## 2022-12-07 DIAGNOSIS — E1142 Type 2 diabetes mellitus with diabetic polyneuropathy: Secondary | ICD-10-CM

## 2022-12-07 DIAGNOSIS — I10 Essential (primary) hypertension: Secondary | ICD-10-CM | POA: Diagnosis not present

## 2022-12-07 LAB — POCT GLYCOSYLATED HEMOGLOBIN (HGB A1C): Hemoglobin A1C: 6.1 % — AB (ref 4.0–5.6)

## 2022-12-07 MED ORDER — DEXCOM G7 SENSOR MISC
1.0000 | 3 refills | Status: DC
Start: 2022-12-07 — End: 2023-04-02

## 2022-12-07 MED ORDER — DEXCOM G7 RECEIVER DEVI
1.0000 | Freq: Once | 0 refills | Status: DC
Start: 2022-12-07 — End: 2022-12-07

## 2022-12-07 NOTE — Assessment & Plan Note (Signed)
Patient reports chronic low back pain.  He has some radiation into right lower extremity.  He did have x-rays completed earlier this year as part of evaluation after fall.  X-rays did show degenerative changes through lumbar spine, no acute bony abnormality or fracture observed.  He denies any specific evaluation otherwise on low back.  Does not have any numbness or tingling into lower extremities. At patient request, we will proceed with referral to spine specialist, referral placed today.  Advised that if he does not hear from their office within the next 1 to 2 weeks, to let us know and we can assist with arranging appointment

## 2022-12-07 NOTE — Progress Notes (Signed)
Procedures performed today:    None.  Independent interpretation of notes and tests performed by another provider:   None.  Brief History, Exam, Impression, and Recommendations:    BP (!) 158/85 Comment: Repeat BP  Pulse 81   Ht 5\' 9"  (1.753 m)   Wt 244 lb (110.7 kg)   SpO2 96%   BMI 36.03 kg/m   Type 2 diabetes mellitus with diabetic polyneuropathy, without long-term current use of insulin (HCC) Assessment & Plan: Patient continues with metformin.  Hemoglobin A1c checked today and found to be at goal at 6.1%.  No current issues with polyuria or polydipsia.  He reports he is doing well with metformin, no concerns at this time.  He is requesting prescription for CGM device to utilize for monitoring blood sugars.  He is not regularly checking blood sugars right now.  Has not had any concerning symptoms for high or low blood sugar readings. We did discuss that patient is not necessarily needing to closely monitor blood sugars given that he has had good control of hemoglobin A1c and is not utilizing any medications with notable risk of hypoglycemic episodes.  He is also not utilizing medication such as insulin which would be reliant upon knowing the blood sugar for dosing.  Despite this, he is wanting to have CGM device available.  Did discuss that ultimately it would depend upon insurance approval for coverage of device and corresponding cost We will continue with metformin, no changes to regimen Order placed for Dexcom Patient is due for urine microalbumin/creatinine ratio testing, however he declines proceeding with this today as he reports it was done at the [place] yesterday.  Will reach out to [place] to request records  Orders: -     POCT glycosylated hemoglobin (Hb A1C) -     Dexcom G7 Receiver; 1 each by Does not apply route once for 1 dose.  Dispense: 1 each; Refill: 0 -     Dexcom G7 Sensor; 1 application  by Does not apply route every 14 (fourteen) days.  Dispense: 2 each; Refill:  3  Chronic bilateral low back pain with right-sided sciatica Assessment & Plan: Patient reports chronic low back pain.  He has some radiation into right lower extremity.  He did have x-rays completed earlier this year as part of evaluation after fall.  X-rays did show degenerative changes through lumbar spine, no acute bony abnormality or fracture observed.  He denies any specific evaluation otherwise on low back.  Does not have any numbness or tingling into lower extremities. At patient request, we will proceed with referral to spine specialist, referral placed today.  Advised that if he does not hear from their office within the next 1 to 2 weeks, to let us know and we can assist with arranging appointment  Orders: -     Ambulatory referral to Orthopedic Surgery  Essential hypertension Assessment & Plan: Blood pressure elevated in office today initially, did improve on recheck.  Patient continues with lisinopril and amlodipine.  Since last visit with me, patient had stopped taking hydrochlorothiazide and was prescribed amlodipine.  No current issues related to medications. For now, we will continue with current medication regimen, no changes made today We will need to monitor blood pressure closely to determine if medication adjustment is needed.  Currently taking maximal dose of lisinopril and amlodipine.  If needing to add additional agent, likely would add thiazide like diuretic such as indapamide or chlorthalidone   Return in about 2 months (  around 02/07/2023) for diabetes.  Spent 43 minutes on this patient encounter, including preparation, chart review, face-to-face counseling with patient and coordination of care, and documentation of encounter.  Reviewed prior office visit notes from specialists as well as prior documentation, labs, imaging from emergency department visits.   ___________________________________________ Kenneth Treiber de [place], MD, ABFM, CAQSM Primary Care and Sports  Medicine [facility]

## 2022-12-07 NOTE — Assessment & Plan Note (Signed)
Blood pressure elevated in office today initially, did improve on recheck.  Patient continues with lisinopril and amlodipine.  Since last visit with me, patient had stopped taking hydrochlorothiazide and was prescribed amlodipine.  No current issues related to medications. For now, we will continue with current medication regimen, no changes made today We will need to monitor blood pressure closely to determine if medication adjustment is needed.  Currently taking maximal dose of lisinopril and amlodipine.  If needing to add additional agent, likely would add thiazide like diuretic such as indapamide or chlorthalidone

## 2022-12-07 NOTE — Assessment & Plan Note (Addendum)
Patient continues with metformin.  Hemoglobin A1c checked today and found to be at goal at 6.1%.  No current issues with polyuria or polydipsia.  He reports he is doing well with metformin, no concerns at this time.  He is requesting prescription for CGM device to utilize for monitoring blood sugars.  He is not regularly checking blood sugars right now.  Has not had any concerning symptoms for high or low blood sugar readings. We did discuss that patient is not necessarily needing to closely monitor blood sugars given that he has had good control of hemoglobin A1c and is not utilizing any medications with notable risk of hypoglycemic episodes.  He is also not utilizing medication such as insulin which would be reliant upon knowing the blood sugar for dosing.  Despite this, he is wanting to have CGM device available.  Did discuss that ultimately it would depend upon insurance approval for coverage of device and corresponding cost We will continue with metformin, no changes to regimen Order placed for Dexcom Patient is due for urine microalbumin/creatinine ratio testing, however he declines proceeding with this today as he reports it was done at the [place] yesterday.  Will reach out to [place] to request records

## 2022-12-20 ENCOUNTER — Encounter: Payer: Self-pay | Admitting: Physical Therapy

## 2022-12-20 ENCOUNTER — Ambulatory Visit (HOSPITAL_BASED_OUTPATIENT_CLINIC_OR_DEPARTMENT_OTHER): Payer: Medicare Other | Admitting: Student

## 2022-12-20 ENCOUNTER — Encounter (HOSPITAL_BASED_OUTPATIENT_CLINIC_OR_DEPARTMENT_OTHER): Payer: Self-pay | Admitting: Orthopaedic Surgery

## 2022-12-20 ENCOUNTER — Ambulatory Visit (INDEPENDENT_AMBULATORY_CARE_PROVIDER_SITE_OTHER): Payer: Medicare Other | Admitting: Physical Therapy

## 2022-12-20 ENCOUNTER — Other Ambulatory Visit (HOSPITAL_BASED_OUTPATIENT_CLINIC_OR_DEPARTMENT_OTHER): Payer: Self-pay

## 2022-12-20 DIAGNOSIS — G8929 Other chronic pain: Secondary | ICD-10-CM | POA: Diagnosis not present

## 2022-12-20 DIAGNOSIS — M5441 Lumbago with sciatica, right side: Secondary | ICD-10-CM | POA: Diagnosis not present

## 2022-12-20 DIAGNOSIS — M5459 Other low back pain: Secondary | ICD-10-CM

## 2022-12-20 MED ORDER — MELOXICAM 15 MG PO TABS
15.0000 mg | ORAL_TABLET | Freq: Every day | ORAL | 0 refills | Status: AC
  Filled 2022-12-20: qty 10, 10d supply, fill #0

## 2022-12-20 NOTE — Progress Notes (Signed)
Chief Complaint: Low back pain     History of Present Illness:    Kenneth Henson is a 82 y.o. male presents today for evaluation of low back pain.  This is radiating down the back of the right thigh almost to the knee.  Patient states that this began approximately 1 year ago with no known injury, however he did sustain a fall in March of this year which has exacerbated this issue.  He did have a right total shoulder revision and ORIF as a result of the fall which he has just finished home therapy for.  His pain levels today are 6 out of 10 and pain spans across the low back and into the right thigh.  His main difficulties are with weightbearing as his back pain is minimal while seated.  He has been taking ibuprofen as needed for pain control but denies any other treatment at this point.  Denies any bowel or bladder dysfunction.  Due to vision issues, finding transportation to appointments is difficult.   Surgical History:   None pertinent to lumbar spine  PMH/PSH/Family History/Social History/Meds/Allergies:    Past Medical History:  Diagnosis Date   Abnormal involuntary movements(781.0)    Arthritis    Chicken pox    Claudication (HCC)    Colon polyps    Diabetes mellitus type II    Erectile dysfunction    Hyperlipidemia    Hypertension    Neoplasm of skin    uncertain behavior   Past Surgical History:  Procedure Laterality Date   CARPAL TUNNEL RELEASE     right   HAND SURGERY     KNEE SURGERY     MINOR HARDWARE REMOVAL Right 09/06/2022   Procedure: MINOR HARDWARE REMOVAL;  Surgeon: Bjorn Pippin, MD;  Location: [facility];  Service: Orthopedics;  Laterality: Right;   NECK SURGERY     ORIF HUMERUS FRACTURE Right 09/06/2022   Procedure: OPEN REDUCTION INTERNAL FIXATION (ORIF) PROXIMAL HUMERUS FRACTURE;  Surgeon: Bjorn Pippin, MD;  Location: [facility];  Service: Orthopedics;  Laterality: Right;   SHOULDER SURGERY Right    2020   SHOULDER SURGERY  Left    2011   TOTAL SHOULDER REVISION Right 09/06/2022   Procedure: TOTAL SHOULDER REVISION;  Surgeon: Bjorn Pippin, MD;  Location: [facility];  Service: Orthopedics;  Laterality: Right;   Social History   Socioeconomic History   Marital status: Single    Spouse name: Not on file   Number of children: Not on file   Years of education: Not on file   Highest education level: Not on file  Occupational History   Not on file  Tobacco Use   Smoking status: Former    Current packs/day: 0.00    Types: Cigarettes    Quit date: 05/15/2009    Years since quitting: 13.6   Smokeless tobacco: Never  Substance and Sexual Activity   Alcohol use: Yes    Alcohol/week: 3.0 standard drinks of alcohol    Types: 1 Glasses of wine, 1 Cans of beer, 1 Shots of liquor per week    Comment: social   Drug use: No   Sexual activity: Not on file  Other Topics Concern   Not on file  Social History Narrative   Not on file   Social Determinants  of Health   Financial Resource Strain: Low Risk  (06/20/2022)   Overall Financial Resource Strain (CARDIA)    Difficulty of Paying Living Expenses: Not hard at all  Food Insecurity: No Food Insecurity (09/08/2022)   Hunger Vital Sign    Worried About Running Out of Food in the Last Year: Never true    Ran Out of Food in the Last Year: Never true  Transportation Needs: No Transportation Needs (09/08/2022)   PRAPARE - Administrator, Civil Service (Medical): No    Lack of Transportation (Non-Medical): No  Physical Activity: Inactive (06/20/2022)   Exercise Vital Sign    Days of Exercise per Week: 0 days    Minutes of Exercise per Session: 0 min  Stress: No Stress Concern Present (06/20/2022)   [employer] of Occupational Health - Occupational Stress Questionnaire    Feeling of Stress : Not at all  Social Connections: Unknown (08/23/2022)   Received from [facility], [facility]   Social Network    Social Network: Not on file   Family History   Problem Relation Age of Onset   Hypertension Mother    Heart disease Mother    Stroke Father    Colon cancer Neg Hx    Stomach cancer Neg Hx    Breast cancer Sister    Colon polyps Sister    Allergies  Allergen Reactions   Doxycycline Nausea And Vomiting   Oxycodone-Acetaminophen Itching   Current Outpatient Medications  Medication Sig Dispense Refill   allopurinol (ZYLOPRIM) 300 MG tablet Take 300 mg by mouth every 12 (twelve) hours as needed (gout).     amLODipine (NORVASC) 10 MG tablet Take 1 tablet (10 mg total) by mouth daily.     amoxicillin (AMOXIL) 500 MG tablet Take 1 tablet (500 mg total) by mouth 3 (three) times daily. 90 tablet 5   Continuous Glucose Sensor (DEXCOM G7 SENSOR) MISC 1 application  by Does not apply route every 14 (fourteen) days. 2 each 3   diclofenac Sodium (VOLTAREN) 1 % GEL Apply 2 g topically 4 (four) times daily. 100 g 0   gabapentin (NEURONTIN) 300 MG capsule Take 1 tab before bed x 2 weeks.  May increase to 1 tab at dinner for 2 weeks and then may increase to am, dinner and before bed thereafter for pain. (Patient taking differently: Take 300 mg by mouth 2 (two) times daily. at supper and bedtime) 90 capsule 3   LINZESS 145 MCG CAPS capsule Take 145 mcg by mouth as needed (constipation).     lisinopril (PRINIVIL,ZESTRIL) 40 MG tablet Take 1 tablet (40 mg total) by mouth daily. 90 tablet 3   metFORMIN (GLUCOPHAGE) 850 MG tablet Take 1 tablet (850 mg total) by mouth daily. 90 tablet 3   mirtazapine (REMERON) 15 MG tablet Take 15 mg by mouth at bedtime.     sildenafil (VIAGRA) 100 MG tablet Take 1 tablet (100 mg total) by mouth as needed for erectile dysfunction. (Patient not taking: Reported on 09/04/2022) 20 tablet 1   simvastatin (ZOCOR) 20 MG tablet Take 1 tablet (20 mg total) by mouth daily. 90 tablet 3   temazepam (RESTORIL) 15 MG capsule Take 15 mg by mouth at bedtime.     No current facility-administered medications for this visit.   No results  found.  Review of Systems:   A ROS was performed including pertinent positives and negatives as documented in the HPI.  Physical Exam :   Constitutional: NAD  and appears stated age Neurological: Alert and oriented Psych: Appropriate affect and cooperative There were no vitals taken for this visit.   Comprehensive Musculoskeletal Exam:    Tenderness over the lumbar spine in area of L3-L4.  Patient ambulating with aid of rolling walker.  Active range of motion of the right hip to 100 degrees flexion, 20 degrees ER, and 10 degrees IR compared to 110 degrees flexion, 30 degrees ER, and 15 degrees IR.  Negative Faber and straight leg raise bilaterally.  Neurosensory exam intact to bilateral lower extremities.  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  No evidence of acute bony abnormality.    I personally reviewed and interpreted the radiographs.   Assessment:   82 y.o. male with chronic low back pain presenting with right-sided sciatica.  X-rays reviewed today do show diffuse degenerative changes with normal alignment.  Based on his exam and presentation today, I do believe he will greatly benefit from physical therapy for stretching and strengthening to reduce his pain and sciatic symptoms.  Due to his transportation issues, I will have him evaluated in clinic today with physical therapy and sent home with home exercise program.  Will also plan to send him in a short course of meloxicam for additional pain relief.  Will plan to assess relief after 4 weeks and can consider continuing/updating HEP program or further workup if needed.  Plan :    -Start meloxicam 15 mg for 10 days -Return to clinic in 4 weeks for reevaluation     I personally saw and evaluated the patient, and participated in the management and treatment plan.  Hazle Nordmann, PA-C Orthopedics  This document was  dictated using Conservation officer, historic buildings. A reasonable attempt at proof reading has been made to minimize errors.

## 2022-12-20 NOTE — Therapy (Signed)
OUTPATIENT PHYSICAL THERAPY SCREEN @[address]     Patient Name: Kenneth Henson MRN: [id number] DOB:1940/07/03, 82 y.o., male Today's Date: 12/20/2022   END OF SESSION:         Past Medical History:  Diagnosis Date   Abnormal involuntary movements(781.0)     Arthritis     Chicken pox     Claudication (HCC)     Colon polyps     Diabetes mellitus type II     Erectile dysfunction     Hyperlipidemia     Hypertension     Neoplasm of skin      uncertain behavior             Past Surgical History:  Procedure Laterality Date   CARPAL TUNNEL RELEASE        right   HAND SURGERY       KNEE SURGERY       MINOR HARDWARE REMOVAL Right 09/06/2022    Procedure: MINOR HARDWARE REMOVAL;  Surgeon: Bjorn Pippin, MD;  Location: [facility];  Service: Orthopedics;  Laterality: Right;   NECK SURGERY       ORIF HUMERUS FRACTURE Right 09/06/2022    Procedure: OPEN REDUCTION INTERNAL FIXATION (ORIF) PROXIMAL HUMERUS FRACTURE;  Surgeon: Bjorn Pippin, MD;  Location: [facility];  Service: Orthopedics;  Laterality: Right;   SHOULDER SURGERY Right      2020   SHOULDER SURGERY Left      2011   TOTAL SHOULDER REVISION Right 09/06/2022    Procedure: TOTAL SHOULDER REVISION;  Surgeon: Bjorn Pippin, MD;  Location: [facility];  Service: Orthopedics;  Laterality: Right;            Patient Active Problem List    Diagnosis Date Noted   Chronic bilateral low back pain with right-sided sciatica 12/07/2022   PICC (peripherally inserted central catheter) removal 11/26/2022   Prosthetic joint infection, subsequent encounter 10/19/2022   Medication management 10/04/2022   Status post reverse total arthroplasty of right shoulder 09/06/2022   Acute pain of right shoulder 08/11/2022   Synovial cyst of left popliteal space 08/11/2022   Fracture of left tibial plateau 08/06/2022   Need for influenza vaccination 03/01/2021   Pustular inflammation of skin 12/01/2020   Decreased visual acuity 12/01/2020    Moderately increased albuminuria 12/01/2020   Diabetes mellitus without complication (HCC) 10/27/2020   Vitreomacular adhesion of both eyes 10/27/2020   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 10/27/2020   Exudative age-related macular degeneration of left eye with active choroidal neovascularization (HCC) 10/27/2020   Diabetes (HCC) 09/01/2020   Retinal tear 09/01/2020   Headache(784.0) 01/16/2012   Atypical nevus 11/07/2011   Trigger finger 05/22/2011   Bronchitis 05/22/2011   Preop cardiovascular exam 04/11/2011   Knee pain 02/24/2011   Bursitis of elbow 10/31/2010   OBESITY 04/28/2010   INCONTINENCE, URGE 04/28/2010   LEG PAIN, BILATERAL 12/08/2009   CHEST PAIN 12/08/2009   ELECTROCARDIOGRAM, ABNORMAL 12/08/2009   NEOPLASM, SKIN, UNCERTAIN BEHAVIOR 12/06/2009   PREMATURE ATRIAL CONTRACTIONS 12/06/2009   COLONIC POLYPS, ADENOMATOUS, HX OF 11/17/2009   ERECTILE DYSFUNCTION, 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    Medbridge HEP code:   Access Code: [email or web address] URL: [email or web address] Date: 12/20/2022 Prepared by: Lorayne Bender [email or web address]   Clinical Impression & Plan: Patient is an 82 year old male who presents with right hip and right radicular pain.  He has increased pain when he sits for too long.  He also has increased pain when he stands.  At this time he can only stand for about 3-1/2 minutes before he has significant pain radiating down into his right lower extremity.  He  has a long history of pain but the pain has been exacerbated by a recent fall.  He is just finished a course of physical therapy for his right shoulder.  He has had some difficulty with transportation getting to and from physical therapy he would like to try home exercise program for his lower back.  He has a significant muscle spasm in his right gluteal he has limited hip range of motion into flexion and external rotation.  Uses a walker for primary mobility.  He was given a home exercise program that involved stretching exercises as well as baseline core strengthening exercises.  He tolerated exercise well.  He will follow-up with MD in 4 weeks.  If he is making progress he may benefit from further skilled physical therapy.   Lorayne Bender PT DPT   12/20/2022, 11:54 AM   [address] [place], [place] [address] [phone]     Note: charges not applied for screen.

## 2022-12-20 NOTE — Therapy (Deleted)
OUTPATIENT PHYSICAL THERAPY SCREEN @[address]     Patient Name: Kenneth Henson MRN: [id number] DOB:1940/07/03, 82 y.o., male Today's Date: 12/20/2022   END OF SESSION:         Past Medical History:  Diagnosis Date   Abnormal involuntary movements(781.0)     Arthritis     Chicken pox     Claudication (HCC)     Colon polyps     Diabetes mellitus type II     Erectile dysfunction     Hyperlipidemia     Hypertension     Neoplasm of skin      uncertain behavior             Past Surgical History:  Procedure Laterality Date   CARPAL TUNNEL RELEASE        right   HAND SURGERY       KNEE SURGERY       MINOR HARDWARE REMOVAL Right 09/06/2022    Procedure: MINOR HARDWARE REMOVAL;  Surgeon: Bjorn Pippin, MD;  Location: [facility];  Service: Orthopedics;  Laterality: Right;   NECK SURGERY       ORIF HUMERUS FRACTURE Right 09/06/2022    Procedure: OPEN REDUCTION INTERNAL FIXATION (ORIF) PROXIMAL HUMERUS FRACTURE;  Surgeon: Bjorn Pippin, MD;  Location: [facility];  Service: Orthopedics;  Laterality: Right;   SHOULDER SURGERY Right      2020   SHOULDER SURGERY Left      2011   TOTAL SHOULDER REVISION Right 09/06/2022    Procedure: TOTAL SHOULDER REVISION;  Surgeon: Bjorn Pippin, MD;  Location: [facility];  Service: Orthopedics;  Laterality: Right;            Patient Active Problem List    Diagnosis Date Noted   Chronic bilateral low back pain with right-sided sciatica 12/07/2022   PICC (peripherally inserted central catheter) removal 11/26/2022   Prosthetic joint infection, subsequent encounter 10/19/2022   Medication management 10/04/2022   Status post reverse total arthroplasty of right shoulder 09/06/2022   Acute pain of right shoulder 08/11/2022   Synovial cyst of left popliteal space 08/11/2022   Fracture of left tibial plateau 08/06/2022   Need for influenza vaccination 03/01/2021   Pustular inflammation of skin 12/01/2020   Decreased visual acuity 12/01/2020    Moderately increased albuminuria 12/01/2020   Diabetes mellitus without complication (HCC) 10/27/2020   Vitreomacular adhesion of both eyes 10/27/2020   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 10/27/2020   Exudative age-related macular degeneration of left eye with active choroidal neovascularization (HCC) 10/27/2020   Diabetes (HCC) 09/01/2020   Retinal tear 09/01/2020   Headache(784.0) 01/16/2012   Atypical nevus 11/07/2011   Trigger finger 05/22/2011   Bronchitis 05/22/2011   Preop cardiovascular exam 04/11/2011   Knee pain 02/24/2011   Bursitis of elbow 10/31/2010   OBESITY 04/28/2010   INCONTINENCE, URGE 04/28/2010   LEG PAIN, BILATERAL 12/08/2009   CHEST PAIN 12/08/2009   ELECTROCARDIOGRAM, ABNORMAL 12/08/2009   NEOPLASM, SKIN, UNCERTAIN BEHAVIOR 12/06/2009   PREMATURE ATRIAL CONTRACTIONS 12/06/2009   COLONIC POLYPS, ADENOMATOUS, HX OF 11/17/2009   ERECTILE DYSFUNCTION, 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    Medbridge HEP code:   Access Code: [email or web address] URL: [email or web address] Date: 12/20/2022 Prepared by: Lorayne Bender [email or web address]   Clinical Impression & Plan: Patient is an 82 year old male who presents with right hip and right radicular pain.  He has increased pain when he sits for too long.  He also has increased pain when he stands.  At this time he can only stand for about 3-1/2 minutes before he has significant pain radiating down into his right lower extremity.  He  has a long history of pain but the pain has been exacerbated by a recent fall.  He is just finished a course of physical therapy for his right shoulder.  He has had some difficulty with transportation getting to and from physical therapy he would like to try home exercise program for his lower back.  He has a significant muscle spasm in his right gluteal he has limited hip range of motion into flexion and external rotation.  Uses a walker for primary mobility.  He was given a home exercise program that involved stretching exercises as well as baseline core strengthening exercises.  He tolerated exercise well.  He will follow-up with MD in 4 weeks.  If he is making progress he may benefit from further skilled physical therapy.   Lorayne Bender PT DPT   12/20/2022, 11:54 AM   [address] [place], [place] [address] [phone]     Note: charges not applied for screen.

## 2022-12-28 ENCOUNTER — Other Ambulatory Visit: Payer: Self-pay | Admitting: Infectious Diseases

## 2022-12-28 ENCOUNTER — Ambulatory Visit: Payer: Medicare Other | Admitting: Infectious Diseases

## 2022-12-28 ENCOUNTER — Encounter: Payer: Self-pay | Admitting: Infectious Diseases

## 2022-12-28 ENCOUNTER — Other Ambulatory Visit: Payer: Self-pay

## 2022-12-28 VITALS — BP 140/78 | HR 80 | Resp 16 | Ht 69.0 in | Wt 245.3 lb

## 2022-12-28 DIAGNOSIS — Z452 Encounter for adjustment and management of vascular access device: Secondary | ICD-10-CM

## 2022-12-28 DIAGNOSIS — Z96611 Presence of right artificial shoulder joint: Secondary | ICD-10-CM

## 2022-12-28 DIAGNOSIS — T8450XA Infection and inflammatory reaction due to unspecified internal joint prosthesis, initial encounter: Secondary | ICD-10-CM

## 2022-12-28 DIAGNOSIS — Z79899 Other long term (current) drug therapy: Secondary | ICD-10-CM

## 2022-12-28 DIAGNOSIS — T8450XD Infection and inflammatory reaction due to unspecified internal joint prosthesis, subsequent encounter: Secondary | ICD-10-CM

## 2022-12-28 NOTE — Progress Notes (Signed)
Patient Active Problem List   Diagnosis Date Noted   Chronic bilateral low back pain with right-sided sciatica 12/07/2022   PICC (peripherally inserted central catheter) removal 11/26/2022   Prosthetic joint infection, subsequent encounter 10/19/2022   Medication management 10/04/2022   Status post reverse total arthroplasty of right shoulder 09/06/2022   Acute pain of right shoulder 08/11/2022   Synovial cyst of left popliteal space 08/11/2022   Fracture of left tibial plateau 08/06/2022   Need for influenza vaccination 03/01/2021   Pustular inflammation of skin 12/01/2020   Decreased visual acuity 12/01/2020   Moderately increased albuminuria 12/01/2020   Diabetes mellitus without complication (HCC) 10/27/2020   Vitreomacular adhesion of both eyes 10/27/2020   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 10/27/2020   Exudative age-related macular degeneration of left eye with active choroidal neovascularization (HCC) 10/27/2020   Diabetes (HCC) 09/01/2020   Retinal tear 09/01/2020   Headache(784.0) 01/16/2012   Atypical nevus 11/07/2011   Trigger finger 05/22/2011   Bronchitis 05/22/2011   Preop cardiovascular exam 04/11/2011   Knee pain 02/24/2011   Bursitis of elbow 10/31/2010   OBESITY 04/28/2010   INCONTINENCE, URGE 04/28/2010   LEG PAIN, BILATERAL 12/08/2009   CHEST PAIN 12/08/2009   ELECTROCARDIOGRAM, ABNORMAL 12/08/2009   NEOPLASM, SKIN, UNCERTAIN BEHAVIOR 12/06/2009   PREMATURE ATRIAL CONTRACTIONS 12/06/2009   COLONIC POLYPS, ADENOMATOUS, HX OF 11/17/2009   ERECTILE DYSFUNCTION, 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     amLODipine (NORVASC) 10 MG tablet Take 1 tablet (10 mg total) by mouth daily.     amoxicillin (AMOXIL) 500 MG tablet Take 1 tablet (500 mg total) by mouth 3 (three) times daily. 90 tablet 5   Continuous Glucose Sensor (DEXCOM G7 SENSOR) MISC 1 application  by Does not apply route every 14 (fourteen) days. 2 each 3   diclofenac Sodium (VOLTAREN) 1 % GEL Apply 2 g topically 4 (four) times daily. 100 g 0   gabapentin (NEURONTIN) 300 MG capsule Take 1 tab before bed x 2 weeks.  May increase to 1 tab at dinner for 2 weeks and then may increase to am, dinner and before bed thereafter for pain. (Patient taking differently: Take 300 mg by mouth 2 (two) times daily. at supper and bedtime) 90 capsule 3   LINZESS 145 MCG CAPS capsule Take 145 mcg by mouth as needed (constipation).     lisinopril (PRINIVIL,ZESTRIL) 40 MG tablet Take 1 tablet (40 mg total) by mouth daily. 90 tablet 3   metFORMIN (GLUCOPHAGE) 850 MG tablet Take 1 tablet (850 mg total) by mouth daily. 90 tablet 3   mirtazapine (REMERON) 15 MG tablet Take 15 mg by mouth at bedtime.     simvastatin (ZOCOR) 20 MG tablet Take 1 tablet (20 mg total) by mouth daily. 90 tablet 3   temazepam (RESTORIL) 15 MG capsule Take 15 mg by mouth at bedtime.     No current facility-administered medications on file prior to visit.    Subjective: 82 Y O male with PMH as below including legal blindness, hypertension, hyperlipidemia, diabetes mellitus, gout, peripheral neuropathy,  gait instability with recurrent falls, Bilateral shoulder arthroplasties (  right shoulder reverse shoulder arthroplasty done with [employer] implants about four years ago in [place], left reverse total shoulder arthroplasty done by Dr. Beverely Low that has done well for about the last 10 years) who is referred from Ortho for concerns of rt shoulder PJI. S/p (09/06/22)Revision of reverse rt shoulder arthroplasty,  open synovectomy and open reduction of rt shoulder dislocation. OR cx 2/3 samples rare proprionibacterium acnes. Fungal stain negative, cx pending. Patient was admitted 3/24-3/29 for fall. Orthopedics was consulted for left toe, left lateral tibial plateaue # and left lateral malleolus  # and was advised conservative management.   3/24 CT rt shoulder: IMPRESSION: 1. 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 2. Moderate-large volume of fluid within the subacromial-subdeltoid bursal space suggesting bursitis.  He did not have any fevers, chills or concerns of infection at the rt shoulder prior to the fall. Denies taking abtx prior to visit. He has no pain, tenderness, swelling at rt shoulder except limitation of ROM. Seen by Ortho yesterday. He had Vascular US of LLE today which was negative for DVT.   I spoke with Daughter [name] per patient's request to discuss plan of care. She said she will be back to [facility] by Sunday and will help  her father with receiving IV abtx at home. Patient lives in an apartment and also has a caretaker with him. Reports being legally blind but able to count fingers. Denies smoking, alcohol and IVDU. He has no complaints otherwise.   Started IV penicillin from 5/31. Taking linezolid prior to that without missing doses. Completed 6 weeks course on7/12 after which started on suppressive PO amoxicillin 500mg  po tid   8/15 Taking PO amoxicillin without missing doses or any concerns. No new concerns at rt shoulder except limitation of rt shoulder movement. He showed me a spot at his rt shoulder where possible sutures are visible. No pain, tenderness, swelling. He has a fu with Orthopedics in next few months.   Review of Systems: all systems reviewed with pertinent positives and negatives as listed above   Past Medical History:  Diagnosis Date   Abnormal involuntary movements(781.0)    Arthritis     Chicken pox    Claudication (HCC)    Colon polyps    Diabetes mellitus type II    Erectile dysfunction    Hyperlipidemia    Hypertension    Neoplasm of skin    uncertain behavior   Past Surgical History:  Procedure Laterality Date   CARPAL TUNNEL RELEASE     right   HAND SURGERY     KNEE SURGERY     MINOR HARDWARE REMOVAL Right 09/06/2022   Procedure: MINOR HARDWARE REMOVAL;  Surgeon: Bjorn Pippin, MD;  Location: [facility];  Service: Orthopedics;  Laterality: Right;   NECK SURGERY     ORIF HUMERUS FRACTURE Right 09/06/2022   Procedure: OPEN REDUCTION INTERNAL FIXATION (ORIF) PROXIMAL HUMERUS FRACTURE;  Surgeon: Bjorn Pippin, MD;  Location: [facility];  Service: Orthopedics;  Laterality: Right;   SHOULDER SURGERY Right    2020   SHOULDER SURGERY Left    2011   TOTAL SHOULDER REVISION Right 09/06/2022   Procedure: TOTAL SHOULDER REVISION;  Surgeon: Bjorn Pippin, MD;  Location: [facility];  Service: Orthopedics;  Laterality: Right;    Social History   Tobacco Use   Smoking status: Former    Current packs/day: 0.00  Types: Cigarettes    Quit date: 05/15/2009    Years since quitting: 13.6   Smokeless tobacco: Never  Substance Use Topics   Alcohol use: Yes    Alcohol/week: 3.0 standard drinks of alcohol    Types: 1 Glasses of wine, 1 Cans of beer, 1 Shots of liquor per week    Comment: social   Drug use: No    Family History  Problem Relation Age of Onset   Hypertension Mother    Heart disease Mother    Stroke Father    Colon cancer Neg Hx    Stomach cancer Neg Hx    Breast cancer Sister    Colon polyps Sister     Allergies  Allergen Reactions   Doxycycline Nausea And Vomiting   Oxycodone-Acetaminophen Itching    Health Maintenance  Topic Date Due   OPHTHALMOLOGY EXAM  07/07/2021   FOOT EXAM  09/01/2021   COVID-19 Vaccine (6 - 2023-24 season) 01/13/2022   Diabetic kidney evaluation - Urine ACR  03/01/2022   INFLUENZA VACCINE  12/14/2022   HEMOGLOBIN A1C   06/09/2023   Medicare Annual Wellness (AWV)  06/21/2023   Diabetic kidney evaluation - eGFR measurement  10/19/2023   DTaP/Tdap/Td (6 - Td or Tdap) 03/14/2028   Pneumonia Vaccine 26+ Years old  Completed   Zoster Vaccines- Shingrix  Completed   HPV VACCINES  Aged Out   Colonoscopy  Discontinued    Objective: BP (!) 140/78   Pulse 80   Resp 16   Ht 5\' 9"  (1.753 m)   Wt 245 lb 4.8 oz (111.3 kg)   SpO2 98%   BMI 36.22 kg/m   Physical Exam Constitutional:      Appearance: Normal appearance. Morbidly obese, ambulatory with a cane.  HENT:     Head: Normocephalic and atraumatic.      Mouth: Mucous membranes are moist.  Eyes:    Conjunctiva/sclera: Conjunctivae normal.     Pupils: Pupils are equal, round, and bilaterally symmetrical   Cardiovascular:     Rate and Rhythm: Normal rate and regular rhythm.     Heart sounds:  Pulmonary:     Effort: Pulmonary effort is normal.     Breath sounds:  Abdominal:     General: Non distended     Palpations:  Musculoskeletal:        General:  RT shoulder reverse shoulder arthroplasty surgical site has healed. Cannot fully extend his rt shoulder but has reasonable ROM. 2 spots with sutures =  Skin:    General: Skin is warm and dry.     Comments:  Neurological:     General: grossly non focal     Mental Status: awake, alert and oriented to person, place, and time.   Psychiatric:        Mood and Affect: Mood normal.   Lab Results Lab Results  Component Value Date   WBC 12.7 (H) 10/19/2022   HGB 13.5 10/19/2022   HCT 39.3 10/19/2022   MCV 85.6 10/19/2022   PLT 221 10/19/2022    Lab Results  Component Value Date   CREATININE 0.79 10/19/2022   BUN 5 (L) 10/19/2022   NA 131 (L) 10/19/2022   K 4.6 10/19/2022   CL 95 (L) 10/19/2022   CO2 23 10/19/2022    Lab Results  Component Value Date   ALT 15 10/04/2022   AST 14 10/04/2022   ALKPHOS 222 (H) 08/06/2022   BILITOT 0.3 10/04/2022    Lab Results  Component Value Date    CHOL 182 02/24/2011   HDL 74.60 02/24/2011   LDLCALC 92 02/24/2011   TRIG 78.0 02/24/2011   CHOLHDL 2 02/24/2011   No results found for: "LABRPR", "RPRTITER" No results found for: "HIV1RNAQUANT", "HIV1RNAVL", "CD4TABS"  Microbiology Results for orders placed or performed during the hospital encounter of 09/06/22  Surgical pcr screen     Status: Abnormal   Collection Time: 09/06/22 10:14 AM   Specimen: Nasal Mucosa; Nasal Swab  Result Value Ref Range Status   MRSA, PCR NEGATIVE NEGATIVE Final   Staphylococcus aureus POSITIVE (A) NEGATIVE Final    Comment: (NOTE) The Xpert SA Assay (FDA approved for NASAL specimens in patients 24 years of age and older), is one component of a comprehensive surveillance program. It is not intended to diagnose infection nor to guide or monitor treatment. Performed at [facility], [address], [place], [place] [address]   Fungus Culture With Stain     Status: None   Collection Time: 09/06/22  1:40 PM   Specimen: Synovial, Right Shoulder; Body Fluid  Result Value Ref Range Status   Fungus Stain Final report  Final   Fungus (Mycology) Culture Final report  Final    Comment: (NOTE) Performed At: [facility] [address] [place], [place] [id number] Jolene Schimke MD IO:[id number]    Fungal Source TISSUE  Final    Comment: Performed at [facility], [address] [place], [place] [address]  Fungus Culture With Stain     Status: None   Collection Time: 09/06/22  1:40 PM   Specimen: Synovial, Right Shoulder; Body Fluid  Result Value Ref Range Status   Fungus Stain Final report  Final   Fungus (Mycology) Culture Final report  Final    Comment: (NOTE) Performed At: [facility] [address] [place], [place] [id number] Jolene Schimke MD QI:[id number]    Fungal Source TISSUE  Final    Comment: Performed at [facility], [address], [place], [place] [address]   Fungus Culture With Stain     Status: None   Collection Time: 09/06/22  1:40 PM   Specimen: Synovial, Right Shoulder; Body Fluid  Result Value Ref Range Status   Fungus Stain Final report  Final   Fungus (Mycology) Culture Final report  Final    Comment: (NOTE) Performed At: [facility] [address] [place], [place] [id number] Jolene Schimke MD [id number]:[id number]    Fungal Source TISSUE  Final    Comment: Performed at [facility], [address], [place], [place] [address]  Aerobic/Anaerobic Culture w Gram Stain (surgical/deep wound)     Status: None   Collection Time: 09/06/22  1:40 PM   Specimen: Synovial, Right Shoulder; Body Fluid  Result Value Ref Range Status   Specimen Description   Final    TISSUE Performed at [facility], [address], [place], [place] [address]    Special Requests R SHOULDER HOLD 21 DAYS  Final   Gram Stain NO WBC SEEN NO ORGANISMS SEEN   Final   Culture   Final    NO GROWTH 21 DAYS CONTINUING TO HOLD Performed at [facility], [address] [place], [place] [address]    Report Status 09/27/2022 FINAL  Final  Aerobic/Anaerobic Culture w Gram Stain (surgical/deep wound)     Status: None   Collection Time: 09/06/22  1:40 PM   Specimen: Synovial, Right Shoulder; Body Fluid  Result Value Ref Range Status   Specimen Description   Final    TISSUE Performed at [facility], [address], [place], [place] [address]    Special Requests   Final    R SHOULDER Performed at [facility], [address] [place], [place] [address]    Gram Stain NO WBC SEEN NO ORGANISMS SEEN   Final   Culture   Final    RARE PROPIONIBACTERIUM ACNES Standardized susceptibility testing for this organism is not available. Performed at [facility], [address] [place], [place] [address]    Report Status 09/11/2022 FINAL  Final  Aerobic/Anaerobic Culture w Gram Stain  (surgical/deep wound)     Status: None   Collection Time: 09/06/22  1:40 PM   Specimen: Synovial, Right Shoulder; Body Fluid  Result Value Ref Range Status   Specimen Description   Final    TISSUE Performed at [facility], [address], [place], [place] [address]    Special Requests   Final    R SHOULDER Performed at [facility], [address], [place], [place] [address]    Gram Stain NO WBC SEEN NO ORGANISMS SEEN   Final   Culture   Final    RARE PROPIONIBACTERIUM ACNES NO ANAEROBES ISOLATED CRITICAL RESULT CALLED TO, READ BACK BY AND VERIFIED WITH: RN [email or web address].P AT 0913 ON 09/25/2022 BY T.SAAD Performed at [facility], [address] [place], [place] [address]    Report Status 09/25/2022 FINAL  Final  Fungus Culture Result     Status: None   Collection Time: 09/06/22  1:40 PM  Result Value Ref Range Status   Result 1 Comment  Final    Comment: (NOTE) KOH/Calcofluor preparation:  no fungus observed. Performed At: [facility] [address] [place], [place] [id number] Jolene Schimke MD YQ:[id number]   Fungus Culture Result     Status: None   Collection Time: 09/06/22  1:40 PM  Result Value Ref Range Status   Result 1 Comment  Final    Comment: (NOTE) KOH/Calcofluor preparation:  no fungus observed. Performed At: [facility] [address] [place], [place] [id number] Jolene Schimke MD RJ:[id number]   Fungus Culture Result     Status: None   Collection Time: 09/06/22  1:40 PM  Result Value Ref Range Status   Result 1 Comment  Final    Comment: (NOTE) KOH/Calcofluor preparation:  no fungus observed. Performed At: [facility] [address] [place], [place] [id number] Jolene Schimke MD TF:[id number]   Fungal organism reflex     Status: None   Collection Time: 09/06/22  1:40 PM  Result Value Ref Range Status   Fungal result 1 Comment  Final    Comment: (NOTE) No yeast or mold isolated after  4 weeks. Performed At: [facility] [address] [place], [place] [id number] Jolene Schimke MD BT:[id number]   Fungal organism reflex     Status: None   Collection Time: 09/06/22  1:40 PM  Result Value Ref Range Status   Fungal result 1 Comment  Final    Comment: (NOTE) No yeast or mold isolated after 4 weeks. Performed At: [facility] [address] [place], [place] [id number] Jolene Schimke MD IO:[id number]   Fungal organism reflex     Status: None   Collection Time: 09/06/22  1:40 PM  Result Value Ref Range Status   Fungal result 1 Comment  Final  Comment: (NOTE) No yeast or mold isolated after 4 weeks. Performed At: [facility] [address] [place], [place] [id number] Jolene Schimke MD WU:[id number]    Imaging No results found.  Assessment/Plan 76 Y O male with PMH as below including legal blindness, hypertension, hyperlipidemia, diabetes mellitus, gout, peripheral neuropathy,  gait instability with recurrent falls, Bilateral shoulder arthroplasties (  right shoulder reverse shoulder arthroplasty done with [employer] implants about four years ago in [place], left reverse total shoulder arthroplasty done by Dr. Beverely Low that has done well for about the last 10 years) with   # Rt shoulder PJI  # Medication management  - s/p rt revision reverse shoulder arthroplasty, open synovectomy and open treatment of shoulder dislocation. 2/3 Cx Proprionibacterium acnes, Fungal stain negative, fungal cx  no growth  - Completed 6 weeks of IV abtx on 11/24/22 ( linezolid prior to that) after which started on amoxicillin 500mg  po tid from 7/13, plan for 3-6 months   Plan Continue PO amoxicillin 500mg  po tid for at least 3 months if not 6  Fu in 2 months, will assess at that time for possible stopping    # Medication monitoring  - labs today   I have personally spent 41  minutes involved in face-to-face and non-face-to-face activities for this  patient on the day of the visit. Professional time spent includes the following activities: Preparing to see the patient (review of tests), Obtaining and/or reviewing separately obtained history (admission/discharge record), Performing a medically appropriate examination and/or evaluation , Ordering medications/tests/procedures, referring and communicating with other health care professionals, Documenting clinical information in the EMR, Independently interpreting results (not separately reported), Communicating results to the patient/family/caregiver, Counseling and educating the patient/family/caregiver and Care coordination (not separately reported).   Victoriano Lain, MD [facility] 12/28/2022, 2:03 PM

## 2022-12-29 LAB — SEDIMENTATION RATE: Sed Rate: 25 mm/h — ABNORMAL HIGH (ref 0–20)

## 2022-12-29 LAB — BASIC METABOLIC PANEL
BUN: 7 mg/dL (ref 7–25)
CO2: 25 mmol/L (ref 20–32)
Calcium: 8.8 mg/dL (ref 8.6–10.3)
Chloride: 102 mmol/L (ref 98–110)
Creat: 0.73 mg/dL (ref 0.70–1.22)
Glucose, Bld: 97 mg/dL (ref 65–99)
Potassium: 4.7 mmol/L (ref 3.5–5.3)
Sodium: 139 mmol/L (ref 135–146)

## 2022-12-29 LAB — CBC
HCT: 41.8 % (ref 38.5–50.0)
Hemoglobin: 14.4 g/dL (ref 13.2–17.1)
MCH: 29.9 pg (ref 27.0–33.0)
MCHC: 34.4 g/dL (ref 32.0–36.0)
MCV: 86.7 fL (ref 80.0–100.0)
MPV: 9.9 fL (ref 7.5–12.5)
Platelets: 347 10*3/uL (ref 140–400)
RBC: 4.82 10*6/uL (ref 4.20–5.80)
RDW: 12.9 % (ref 11.0–15.0)
WBC: 11.8 10*3/uL — ABNORMAL HIGH (ref 3.8–10.8)

## 2022-12-29 LAB — C-REACTIVE PROTEIN: CRP: 14.4 mg/L — ABNORMAL HIGH (ref <8.0)

## 2023-01-01 ENCOUNTER — Telehealth: Payer: Self-pay

## 2023-01-01 NOTE — Telephone Encounter (Signed)
Spoke with patient regarding lab results. No questions at this time.  Juanita Laster, RMA

## 2023-01-01 NOTE — Telephone Encounter (Signed)
-----   Message from Victoriano Lain sent at 01/01/2023  8:18 AM EDT ----- Please let patient know lab work is negative for any acute abnormality. CRP is coming down which is a good sign in terms of the shoulder infection. Continue abtx as discussed in office visit.

## 2023-01-17 ENCOUNTER — Ambulatory Visit (HOSPITAL_BASED_OUTPATIENT_CLINIC_OR_DEPARTMENT_OTHER): Payer: Medicare Other | Admitting: Orthopaedic Surgery

## 2023-01-17 DIAGNOSIS — M5441 Lumbago with sciatica, right side: Secondary | ICD-10-CM | POA: Diagnosis not present

## 2023-01-17 DIAGNOSIS — G8929 Other chronic pain: Secondary | ICD-10-CM

## 2023-01-17 NOTE — Progress Notes (Signed)
Chief Complaint: Back pain     History of Present Illness:    Kenneth Henson is a 82 y.o. male presents today with radiating ongoing lower back pain down the posterior aspect of the right thigh.  He has previously seen by PA Hazle Nordmann for this.  He was started on an anti-inflammatory without any relief.  He has initially been doing physical therapy exercises without any relief.  He is here today for further discussion and follow-up.    Surgical History:   None  PMH/PSH/Family History/Social History/Meds/Allergies:    Past Medical History:  Diagnosis Date   Abnormal involuntary movements(781.0)    Arthritis    Chicken pox    Claudication (HCC)    Colon polyps    Diabetes mellitus type II    Erectile dysfunction    Hyperlipidemia    Hypertension    Neoplasm of skin    uncertain behavior   Past Surgical History:  Procedure Laterality Date   CARPAL TUNNEL RELEASE     right   HAND SURGERY     KNEE SURGERY     MINOR HARDWARE REMOVAL Right 09/06/2022   Procedure: MINOR HARDWARE REMOVAL;  Surgeon: Bjorn Pippin, MD;  Location: [facility];  Service: Orthopedics;  Laterality: Right;   NECK SURGERY     ORIF HUMERUS FRACTURE Right 09/06/2022   Procedure: OPEN REDUCTION INTERNAL FIXATION (ORIF) PROXIMAL HUMERUS FRACTURE;  Surgeon: Bjorn Pippin, MD;  Location: [facility];  Service: Orthopedics;  Laterality: Right;   SHOULDER SURGERY Right    2020   SHOULDER SURGERY Left    2011   TOTAL SHOULDER REVISION Right 09/06/2022   Procedure: TOTAL SHOULDER REVISION;  Surgeon: Bjorn Pippin, MD;  Location: [facility];  Service: Orthopedics;  Laterality: Right;   Social History   Socioeconomic History   Marital status: Single    Spouse name: Not on file   Number of children: Not on file   Years of education: Not on file   Highest education level: Not on file  Occupational History   Not on file  Tobacco Use   Smoking status: Former    Current  packs/day: 0.00    Types: Cigarettes    Quit date: 05/15/2009    Years since quitting: 13.6   Smokeless tobacco: Never  Substance and Sexual Activity   Alcohol use: Yes    Alcohol/week: 3.0 standard drinks of alcohol    Types: 1 Glasses of wine, 1 Cans of beer, 1 Shots of liquor per week    Comment: social   Drug use: No   Sexual activity: Not on file  Other Topics Concern   Not on file  Social History Narrative   Not on file   Social Determinants of Health   Financial Resource Strain: Low Risk  (06/20/2022)   Overall Financial Resource Strain (CARDIA)    Difficulty of Paying Living Expenses: Not hard at all  Food Insecurity: No Food Insecurity (09/08/2022)   Hunger Vital Sign    Worried About Running Out of Food in the Last Year: Never true    Ran Out of Food in the Last Year: Never true  Transportation Needs: No Transportation Needs (09/08/2022)   PRAPARE - Administrator, Civil Service (Medical): No    Lack of Transportation (Non-Medical):  No  Physical Activity: Inactive (06/20/2022)   Exercise Vital Sign    Days of Exercise per Week: 0 days    Minutes of Exercise per Session: 0 min  Stress: No Stress Concern Present (06/20/2022)   [employer] of Occupational Health - Occupational Stress Questionnaire    Feeling of Stress : Not at all  Social Connections: Unknown (08/23/2022)   Received from [facility]   Social Network    Social Network: Not on file   Family History  Problem Relation Age of Onset   Hypertension Mother    Heart disease Mother    Stroke Father    Colon cancer Neg Hx    Stomach cancer Neg Hx    Breast cancer Sister    Colon polyps Sister    Allergies  Allergen Reactions   Doxycycline Nausea And Vomiting   Oxycodone-Acetaminophen Itching   Current Outpatient Medications  Medication Sig Dispense Refill   allopurinol (ZYLOPRIM) 300 MG tablet Take 300 mg by mouth every 12 (twelve) hours as needed (gout).     amLODipine  (NORVASC) 10 MG tablet Take 1 tablet (10 mg total) by mouth daily.     amoxicillin (AMOXIL) 500 MG tablet Take 1 tablet (500 mg total) by mouth 3 (three) times daily. 90 tablet 5   Continuous Glucose Sensor (DEXCOM G7 SENSOR) MISC 1 application  by Does not apply route every 14 (fourteen) days. 2 each 3   diclofenac Sodium (VOLTAREN) 1 % GEL Apply 2 g topically 4 (four) times daily. 100 g 0   gabapentin (NEURONTIN) 300 MG capsule Take 1 tab before bed x 2 weeks.  May increase to 1 tab at dinner for 2 weeks and then may increase to am, dinner and before bed thereafter for pain. (Patient taking differently: Take 300 mg by mouth 2 (two) times daily. at supper and bedtime) 90 capsule 3   LINZESS 145 MCG CAPS capsule Take 145 mcg by mouth as needed (constipation).     lisinopril (PRINIVIL,ZESTRIL) 40 MG tablet Take 1 tablet (40 mg total) by mouth daily. 90 tablet 3   metFORMIN (GLUCOPHAGE) 850 MG tablet Take 1 tablet (850 mg total) by mouth daily. 90 tablet 3   mirtazapine (REMERON) 15 MG tablet Take 15 mg by mouth at bedtime.     simvastatin (ZOCOR) 20 MG tablet Take 1 tablet (20 mg total) by mouth daily. 90 tablet 3   temazepam (RESTORIL) 15 MG capsule Take 15 mg by mouth at bedtime.     No current facility-administered medications for this visit.   No results found.  Review of Systems:   A ROS was performed including pertinent positives and negatives as documented in the HPI.  Physical Exam :   Constitutional: NAD and appears stated age Neurological: Alert and oriented Psych: Appropriate affect and cooperative There were no vitals taken for this visit.   Comprehensive Musculoskeletal Exam:    Positive straight leg raise on the right with radicular type symptoms down the right leg in addition of right back pain  Imaging:    I personally reviewed and interpreted the radiographs.   Assessment:   82 y.o. male with evidence of radiculopathy on the right side.  I did discuss that he has  trialed initial physical therapy as well as anti-inflammatories.  I would like him to work with a formal physical therapy program for core strengthening and stretching exercises.  I would also like them to work on his hamstring which is quite  tight.  Will plan for an MRI of his lumbar spine and follow-up discuss results  Plan :    -Plan for MRI lumbar spine and follow-up discuss results     I personally saw and evaluated the patient, and participated in the management and treatment plan.  Huel Cote, MD Attending Physician, Orthopedic Surgery  This document was dictated using Dragon voice recognition software. A reasonable attempt at proof reading has been made to minimize errors.

## 2023-01-22 ENCOUNTER — Telehealth (HOSPITAL_BASED_OUTPATIENT_CLINIC_OR_DEPARTMENT_OTHER): Payer: Self-pay | Admitting: Orthopaedic Surgery

## 2023-01-22 NOTE — Telephone Encounter (Signed)
Marin Comment  from [facility]  ([phone]  would like notes for patient Kenneth Henson  verbal order to delay his admission visit until tomorrow. Patient said he has mulit visits today and wants to change appointment until tomorrow

## 2023-01-22 NOTE — Telephone Encounter (Signed)
LM on secure VM giving verbal okay to delay evaluation and faxed OV notes to [facility]

## 2023-01-25 ENCOUNTER — Telehealth: Payer: Self-pay | Admitting: Orthopaedic Surgery

## 2023-01-25 NOTE — Telephone Encounter (Signed)
Kelly called. She is the PT working in home with patient she would like verbal orders for PT 1wk 1x, 2wk 4x, 1wk 4x. Her [phone]

## 2023-01-26 NOTE — Telephone Encounter (Signed)
LM on secure line for verbal orders

## 2023-01-30 ENCOUNTER — Other Ambulatory Visit: Payer: Self-pay | Admitting: Orthopaedic Surgery

## 2023-01-30 ENCOUNTER — Telehealth: Payer: Self-pay | Admitting: Orthopaedic Surgery

## 2023-01-30 MED ORDER — LORAZEPAM 1 MG PO TABS: 1.0000 mg | ORAL_TABLET | Freq: Three times a day (TID) | ORAL | 0 refills | Status: DC

## 2023-01-30 NOTE — Telephone Encounter (Signed)
Called and advised pt. 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

## 2023-01-30 NOTE — Telephone Encounter (Signed)
Pt is getting his MRI on 10/1 and needs something to calm his anxiety sent to [facility] on file

## 2023-02-08 ENCOUNTER — Ambulatory Visit (HOSPITAL_BASED_OUTPATIENT_CLINIC_OR_DEPARTMENT_OTHER): Payer: Medicare Other | Admitting: Family Medicine

## 2023-02-08 ENCOUNTER — Encounter (HOSPITAL_BASED_OUTPATIENT_CLINIC_OR_DEPARTMENT_OTHER): Payer: Self-pay | Admitting: Family Medicine

## 2023-02-08 VITALS — BP 136/78 | HR 66 | Ht 68.0 in | Wt 251.6 lb

## 2023-02-08 DIAGNOSIS — D170 Benign lipomatous neoplasm of skin and subcutaneous tissue of head, face and neck: Secondary | ICD-10-CM

## 2023-02-08 DIAGNOSIS — G4700 Insomnia, unspecified: Secondary | ICD-10-CM | POA: Insufficient documentation

## 2023-02-08 DIAGNOSIS — Z23 Encounter for immunization: Secondary | ICD-10-CM | POA: Diagnosis not present

## 2023-02-08 DIAGNOSIS — L989 Disorder of the skin and subcutaneous tissue, unspecified: Secondary | ICD-10-CM

## 2023-02-08 NOTE — Progress Notes (Signed)
    Procedures performed today:    None.  Independent interpretation of notes and tests performed by another provider:   None.  Brief History, Exam, Impression, and Recommendations:    BP 136/78 (BP Location: Right Arm, Patient Position: Sitting, Cuff Size: Normal)   Pulse 66   Ht 5\' 8"  (1.727 m)   Wt 251 lb 9.6 oz (114.1 kg)   SpO2 94%   BMI 38.26 kg/m   Lipoma of neck Assessment & Plan: Patient reports mass/increased prominence over back of neck.  Uncertain how long this has been present, has been at least several months or more.  No significant pain from this mass. On exam, area of increased prominence over back of the neck that is soft on palpation, no fluctuance noted.  No tenderness with palpation.  No erythema present. Area appears most consistent with lipoma.  Discussed diagnosis and treatment considerations related to this.  For now, patient wishes to continue with monitoring.  Advised that if he does change his mind, to let us know and we can proceed with referral to dermatology for further evaluation and treatment   Skin lesion Assessment & Plan: Over posterior aspect of left leg, patient does have skin lesion present which was first noticed a few months ago.  He denies any pain, bleeding, itching. On exam, there is a skin lesion with stuck on appearance, slightly darker than surrounding skin.  No signs of bleeding, erythema, scaling or crusting. Suspect likely SK, discussed this and treatment considerations.  Likely can continue with monitoring for now.  If changes do occur or if patient would like to proceed with referral to dermatology we can place referral at any time   Encounter for immunization -     Flu Vaccine Trivalent High Dose (Fluad)  Return in about 3 months (around 05/10/2023).   ___________________________________________ Kenneth Notarianni de [place], MD, ABFM, CAQSM Primary Care and Sports [facility]

## 2023-02-13 ENCOUNTER — Ambulatory Visit
Admission: RE | Admit: 2023-02-13 | Discharge: 2023-02-13 | Disposition: A | Discharge status: Home / Self Care | Payer: Medicare Other | Source: Ambulatory Visit | Attending: Orthopaedic Surgery | Admitting: Orthopaedic Surgery

## 2023-02-13 DIAGNOSIS — G8929 Other chronic pain: Secondary | ICD-10-CM

## 2023-02-23 ENCOUNTER — Ambulatory Visit (HOSPITAL_BASED_OUTPATIENT_CLINIC_OR_DEPARTMENT_OTHER): Payer: Medicare Other | Admitting: Orthopaedic Surgery

## 2023-02-23 DIAGNOSIS — M5441 Lumbago with sciatica, right side: Secondary | ICD-10-CM

## 2023-02-23 DIAGNOSIS — G8929 Other chronic pain: Secondary | ICD-10-CM

## 2023-02-23 NOTE — Addendum Note (Signed)
Addended by: Jeanella Cara on: 02/23/2023 12:16 PM   Modules accepted: Orders

## 2023-02-23 NOTE — Progress Notes (Signed)
Chief Complaint: Back pain     History of Present Illness:   02/23/2023: Today for MRI review.  He has progressively decreasing and his ability to walk and is now having persistent pain down the bilateral lower buttocks  Kenneth Henson is a 82 y.o. male presents today with radiating ongoing lower back pain down the posterior aspect of the right thigh.  He has previously seen by PA Hazle Nordmann for this.  He was started on an anti-inflammatory without any relief.  He has initially been doing physical therapy exercises without any relief.  He is here today for further discussion and follow-up.    Surgical History:   None  PMH/PSH/Family History/Social History/Meds/Allergies:    Past Medical History:  Diagnosis Date   Abnormal involuntary movements(781.0)    Arthritis    Chicken pox    Claudication (HCC)    Colon polyps    Diabetes mellitus type II    Erectile dysfunction    Hyperlipidemia    Hypertension    Neoplasm of skin    uncertain behavior   Past Surgical History:  Procedure Laterality Date   CARPAL TUNNEL RELEASE     right   HAND SURGERY     KNEE SURGERY     MINOR HARDWARE REMOVAL Right 09/06/2022   Procedure: MINOR HARDWARE REMOVAL;  Surgeon: Bjorn Pippin, MD;  Location: [facility];  Service: Orthopedics;  Laterality: Right;   NECK SURGERY     ORIF HUMERUS FRACTURE Right 09/06/2022   Procedure: OPEN REDUCTION INTERNAL FIXATION (ORIF) PROXIMAL HUMERUS FRACTURE;  Surgeon: Bjorn Pippin, MD;  Location: [facility];  Service: Orthopedics;  Laterality: Right;   SHOULDER SURGERY Right    2020   SHOULDER SURGERY Left    2011   TOTAL SHOULDER REVISION Right 09/06/2022   Procedure: TOTAL SHOULDER REVISION;  Surgeon: Bjorn Pippin, MD;  Location: [facility];  Service: Orthopedics;  Laterality: Right;   Social History   Socioeconomic History   Marital status: Single    Spouse name: Not on file   Number of children: Not on file   Years of  education: Not on file   Highest education level: Not on file  Occupational History   Not on file  Tobacco Use   Smoking status: Former    Current packs/day: 0.00    Types: Cigarettes    Quit date: 05/15/2009    Years since quitting: 13.7   Smokeless tobacco: Never  Substance and Sexual Activity   Alcohol use: Yes    Alcohol/week: 3.0 standard drinks of alcohol    Types: 1 Glasses of wine, 1 Cans of beer, 1 Shots of liquor per week    Comment: social   Drug use: No   Sexual activity: Not on file  Other Topics Concern   Not on file  Social History Narrative   Not on file   Social Determinants of Health   Financial Resource Strain: Low Risk  (06/20/2022)   Overall Financial Resource Strain (CARDIA)    Difficulty of Paying Living Expenses: Not hard at all  Food Insecurity: No Food Insecurity (09/08/2022)   Hunger Vital Sign    Worried About Running Out of Food in the Last Year: Never true    Ran Out of Food in the Last Year: Never true  Transportation Needs: No Transportation Needs (09/08/2022)   PRAPARE - Administrator, Civil Service (Medical): No    Lack of Transportation (Non-Medical): No  Physical Activity: Inactive (06/20/2022)   Exercise Vital Sign    Days of Exercise per Week: 0 days    Minutes of Exercise per Session: 0 min  Stress: No Stress Concern Present (06/20/2022)   [employer] of Occupational Health - Occupational Stress Questionnaire    Feeling of Stress : Not at all  Social Connections: Moderately Isolated (02/08/2023)   Social Connection and Isolation Panel [NHANES]    Frequency of Communication with Friends and Family: More than three times a week    Frequency of Social Gatherings with Friends and Family: More than three times a week    Attends Religious Services: Never    Database administrator or Organizations: Yes    Attends Engineer, structural: More than 4 times per year    Marital Status: Divorced   Family History  Problem  Relation Age of Onset   Hypertension Mother    Heart disease Mother    Stroke Father    Colon cancer Neg Hx    Stomach cancer Neg Hx    Breast cancer Sister    Colon polyps Sister    Allergies  Allergen Reactions   Doxycycline Nausea And Vomiting   Oxycodone-Acetaminophen Itching   Current Outpatient Medications  Medication Sig Dispense Refill   allopurinol (ZYLOPRIM) 300 MG tablet Take 300 mg by mouth every 12 (twelve) hours as needed (gout).     amLODipine (NORVASC) 10 MG tablet Take 1 tablet (10 mg total) by mouth daily.     amoxicillin (AMOXIL) 500 MG tablet Take 1 tablet (500 mg total) by mouth 3 (three) times daily. 90 tablet 5   Continuous Glucose Sensor (DEXCOM G7 SENSOR) MISC 1 application  by Does not apply route every 14 (fourteen) days. 2 each 3   diclofenac Sodium (VOLTAREN) 1 % GEL Apply 2 g topically 4 (four) times daily. 100 g 0   gabapentin (NEURONTIN) 300 MG capsule Take 1 tab before bed x 2 weeks.  May increase to 1 tab at dinner for 2 weeks and then may increase to am, dinner and before bed thereafter for pain. (Patient taking differently: Take 300 mg by mouth 2 (two) times daily. at supper and bedtime) 90 capsule 3   LINZESS 145 MCG CAPS capsule Take 145 mcg by mouth as needed (constipation).     lisinopril (PRINIVIL,ZESTRIL) 40 MG tablet Take 1 tablet (40 mg total) by mouth daily. 90 tablet 3   LORazepam (ATIVAN) 1 MG tablet Take 1 tablet (1 mg total) by mouth every 8 (eight) hours. 1 tablet 0   metFORMIN (GLUCOPHAGE) 850 MG tablet Take 1 tablet (850 mg total) by mouth daily. 90 tablet 3   mirtazapine (REMERON) 15 MG tablet Take 15 mg by mouth at bedtime.     simvastatin (ZOCOR) 20 MG tablet Take 1 tablet (20 mg total) by mouth daily. 90 tablet 3   temazepam (RESTORIL) 15 MG capsule Take 15 mg by mouth at bedtime.     No current facility-administered medications for this visit.   No results found.  Review of Systems:   A ROS was performed including pertinent  positives and negatives as documented in the HPI.  Physical Exam :   Constitutional: NAD and appears stated age Neurological: Alert and oriented Psych: Appropriate affect and cooperative There were no vitals taken for  this visit.   Comprehensive Musculoskeletal Exam:    Positive straight leg raise on the right with radicular type symptoms down the right leg in addition of right back pain  Imaging:    MRI lumbar spine: Significant multilevel degenerative disc disease in the lumbar spine with evidence of lumbar spinal stenosis  I personally reviewed and interpreted the radiographs.   Assessment:   82 y.o. male with evidence of lumbar spinal stenosis which is now progressively limiting his mobility and ability to ambulate.  Given this I do believe he would benefit from an epidural injection with Dr. Alvester Morin.  I would also like him to see Dr. Christell Constant as I do believe he may eventually become a candidate or need a lumbar decompression fusion surgery to address this.  Plan :    -Return to clinic as needed     I personally saw and evaluated the patient, and participated in the management and treatment plan.  Huel Cote, MD Attending Physician, Orthopedic Surgery  This document was dictated using Dragon voice recognition software. A reasonable attempt at proof reading has been made to minimize errors.

## 2023-02-27 ENCOUNTER — Ambulatory Visit: Payer: Medicare Other | Admitting: Infectious Diseases

## 2023-02-27 ENCOUNTER — Other Ambulatory Visit: Payer: Self-pay

## 2023-02-27 ENCOUNTER — Encounter: Payer: Self-pay | Admitting: Infectious Diseases

## 2023-02-27 VITALS — BP 168/83 | HR 80 | Temp 97.9°F | Wt 256.0 lb

## 2023-02-27 DIAGNOSIS — Z79899 Other long term (current) drug therapy: Secondary | ICD-10-CM

## 2023-02-27 DIAGNOSIS — M48061 Spinal stenosis, lumbar region without neurogenic claudication: Secondary | ICD-10-CM

## 2023-02-27 DIAGNOSIS — T8450XD Infection and inflammatory reaction due to unspecified internal joint prosthesis, subsequent encounter: Secondary | ICD-10-CM

## 2023-02-27 NOTE — Progress Notes (Signed)
Patient Active Problem List   Diagnosis Date Noted   Insomnia 02/08/2023   Lipoma of neck 02/08/2023   Skin lesion 02/08/2023   Chronic bilateral low back pain with right-sided sciatica 12/07/2022   PICC (peripherally inserted central catheter) removal 11/26/2022   Prosthetic joint infection, subsequent encounter 10/19/2022   Medication management 10/04/2022   Status post reverse total arthroplasty of right shoulder 09/06/2022   Acute pain of right shoulder 08/11/2022   Synovial cyst of left popliteal space 08/11/2022   Fracture of left tibial plateau 08/06/2022   Need for influenza vaccination 03/01/2021   Pustular inflammation of skin 12/01/2020   Decreased visual acuity 12/01/2020   Moderately increased albuminuria 12/01/2020   Diabetes mellitus without complication (HCC) 10/27/2020   Vitreomacular adhesion of both eyes 10/27/2020   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 10/27/2020   Exudative age-related macular degeneration of left eye with active choroidal neovascularization (HCC) 10/27/2020   Diabetes (HCC) 09/01/2020   Retinal tear 09/01/2020   Headache 01/16/2012   Atypical nevus 11/07/2011   Trigger finger 05/22/2011   Bronchitis 05/22/2011   Preop cardiovascular exam 04/11/2011   Knee pain 02/24/2011   Bursitis of elbow 10/31/2010   OBESITY 04/28/2010   INCONTINENCE, URGE 04/28/2010   LEG PAIN, BILATERAL 12/08/2009   CHEST PAIN 12/08/2009   ELECTROCARDIOGRAM, ABNORMAL 12/08/2009   NEOPLASM, SKIN, UNCERTAIN BEHAVIOR 12/06/2009   PREMATURE ATRIAL CONTRACTIONS 12/06/2009   History of colonic polyps 11/17/2009   ERECTILE DYSFUNCTION, 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     amLODipine (NORVASC) 10 MG tablet Take 1 tablet (10 mg total) by mouth daily.     amoxicillin (AMOXIL) 500 MG tablet Take 1 tablet (500 mg total) by mouth 3 (three) times daily. 90 tablet 5   Continuous Glucose Sensor (DEXCOM G7 SENSOR) MISC 1 application  by Does not apply route every 14 (fourteen) days. 2 each 3   diclofenac Sodium (VOLTAREN) 1 % GEL Apply 2 g topically 4 (four) times daily. 100 g 0   gabapentin (NEURONTIN) 300 MG capsule Take 1 tab before bed x 2 weeks.  May increase to 1 tab at dinner for 2 weeks and then may increase to am, dinner and before bed thereafter for pain. (Patient taking differently: Take 300 mg by mouth 2 (two) times daily. at supper and bedtime) 90 capsule 3   LINZESS 145 MCG CAPS capsule Take 145 mcg by mouth as needed (constipation).     lisinopril (PRINIVIL,ZESTRIL) 40 MG tablet Take 1 tablet (40 mg total) by mouth daily. 90 tablet 3   LORazepam (ATIVAN) 1 MG tablet Take 1 tablet (1 mg total) by mouth every 8 (eight) hours. 1 tablet 0   metFORMIN (GLUCOPHAGE) 850 MG tablet Take 1 tablet (850 mg total) by mouth daily. 90 tablet 3   mirtazapine (REMERON) 15 MG tablet Take 15 mg by mouth at bedtime.     simvastatin (ZOCOR) 20 MG tablet Take 1 tablet (20 mg total) by mouth daily. 90 tablet 3   temazepam (RESTORIL) 15 MG capsule Take 15 mg by mouth at bedtime.  No current facility-administered medications on file prior to visit.    Subjective: 82 Y O male with PMH as below including legal blindness, hypertension, hyperlipidemia, diabetes mellitus, gout, peripheral neuropathy,  gait instability with recurrent falls, Bilateral shoulder arthroplasties (  right shoulder reverse shoulder arthroplasty done with [employer] implants about four years ago in [place], left reverse total shoulder arthroplasty done by Dr. Beverely Low that has done well for about the last 10 years) who is referred from Ortho for concerns of rt shoulder PJI. S/p (09/06/22)Revision of reverse rt shoulder arthroplasty, open synovectomy and open reduction of rt shoulder dislocation. OR cx 2/3 samples rare proprionibacterium acnes. Fungal stain negative, cx pending. Patient was admitted 3/24-3/29 for fall. Orthopedics was consulted for left toe, left lateral tibial plateaue # and left lateral malleolus  # and was advised conservative management.   3/24 CT rt shoulder: IMPRESSION: 1. 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 2. Moderate-large volume of fluid within the subacromial-subdeltoid bursal space suggesting bursitis.  He did not have any fevers, chills or concerns of infection at the rt shoulder prior to the fall. Denies taking abtx prior to visit. He has no pain, tenderness, swelling at rt shoulder except limitation of ROM. Seen by Ortho yesterday. He had Vascular US of LLE today which was negative for DVT.   I spoke with Daughter [name] per patient's request to discuss plan of care. She said she will be back to [facility] by Sunday and will help  her father with receiving IV abtx at home. Patient lives in an apartment and also has a caretaker with him. Reports being legally blind but able to count fingers. Denies smoking, alcohol and IVDU. He has no complaints otherwise.   Started IV penicillin from 5/31. Taking linezolid prior to that without missing doses. Completed 6 weeks course on7/12 after which started on suppressive PO amoxicillin 500mg  po tid   10/15 Taking PO amoxicillin as instructed without any concerns with the antibiotics, denying any nausea or vomiting. He saw Orthopedics after last seen by [place] and they removed the stitch. Next fu is next year. Denies any concerns with rt shoulder. He still does not have full ROM of rt shoulder. He is also following with Dr  Steward Drone for lower back pain and recently had Lumbar MRI done, results pending, he is not sure if he needs any surgery. He reports significant back pain, to the point where he can't stand for more than two minutes. He is ambulatory with the help of cane. No new complaints,   Review of Systems: all systems reviewed with pertinent positives and negatives as listed above   Past Medical History:  Diagnosis Date   Abnormal involuntary movements(781.0)    Arthritis    Chicken pox    Claudication (HCC)    Colon polyps    Diabetes mellitus type II    Erectile dysfunction    Hyperlipidemia    Hypertension    Neoplasm of skin    uncertain behavior   Past Surgical History:  Procedure Laterality Date   CARPAL TUNNEL RELEASE     right   HAND SURGERY     KNEE SURGERY     MINOR HARDWARE REMOVAL Right 09/06/2022   Procedure: MINOR HARDWARE REMOVAL;  Surgeon: Bjorn Pippin, MD;  Location: [facility];  Service: Orthopedics;  Laterality: Right;   NECK SURGERY     ORIF HUMERUS FRACTURE Right 09/06/2022   Procedure: OPEN REDUCTION INTERNAL FIXATION (  ORIF) PROXIMAL HUMERUS FRACTURE;  Surgeon: Bjorn Pippin, MD;  Location: [facility];  Service: Orthopedics;  Laterality: Right;   SHOULDER SURGERY Right    2020   SHOULDER SURGERY Left    2011   TOTAL SHOULDER REVISION Right 09/06/2022   Procedure: TOTAL SHOULDER REVISION;  Surgeon: Bjorn Pippin, MD;  Location: [facility];  Service: Orthopedics;  Laterality: Right;    Social History   Tobacco Use   Smoking status: Former    Current packs/day: 0.00    Types: Cigarettes    Quit date: 05/15/2009    Years since quitting: 13.7   Smokeless tobacco: Never  Substance Use Topics   Alcohol use: Yes    Alcohol/week: 3.0 standard drinks of alcohol    Types: 1 Glasses of wine, 1 Cans of beer, 1 Shots of liquor per week    Comment: social   Drug use: No    Family History  Problem Relation Age of Onset   Hypertension Mother    Heart disease Mother    Stroke Father     Colon cancer Neg Hx    Stomach cancer Neg Hx    Breast cancer Sister    Colon polyps Sister     Allergies  Allergen Reactions   Doxycycline Nausea And Vomiting   Oxycodone-Acetaminophen Itching    Health Maintenance  Topic Date Due   OPHTHALMOLOGY EXAM  07/07/2021   FOOT EXAM  09/01/2021   Diabetic kidney evaluation - Urine ACR  03/01/2022   COVID-19 Vaccine (6 - 2023-24 season) 01/14/2023   HEMOGLOBIN A1C  06/09/2023   Medicare Annual Wellness (AWV)  06/21/2023   Diabetic kidney evaluation - eGFR measurement  12/28/2023   DTaP/Tdap/Td (6 - Td or Tdap) 03/14/2028   Pneumonia Vaccine 13+ Years old  Completed   INFLUENZA VACCINE  Completed   Zoster Vaccines- Shingrix  Completed   HPV VACCINES  Aged Out   Colonoscopy  Discontinued    Objective: BP (!) 168/83   Pulse 80   Temp 97.9 F (36.6 C) (Oral)   Wt 256 lb (116.1 kg)   SpO2 93%   BMI 38.92 kg/m    Physical Exam Constitutional:      Appearance: Normal appearance. Morbidly obese, ambulatory with a cane.  HENT:     Head: Normocephalic and atraumatic.      Mouth: Mucous membranes are moist.  Eyes:    Conjunctiva/sclera: Conjunctivae normal.     Pupils: Pupils are equal, round, and bilaterally symmetrical   Cardiovascular:     Rate and Rhythm: Normal rate and regular rhythm.     Heart sounds:  Pulmonary:     Effort: Pulmonary effort is normal.     Breath sounds:  Abdominal:     General: Non distended     Palpations:  Musculoskeletal:        General:  RT shoulder reverse shoulder arthroplasty surgical site has healed. Does not have full mobility of rt shoulder but has reasonable ROM. Prior sutures removed.   Skin:    General: Skin is warm and dry.     Comments:  Neurological:     General: grossly non focal     Mental Status: awake, alert and oriented to person, place, and time.   Psychiatric:        Mood and Affect: Mood normal.   Lab Results Lab Results  Component Value Date   WBC 11.8  (H) 12/28/2022   HGB 14.4 12/28/2022   HCT 41.8 12/28/2022  MCV 86.7 12/28/2022   PLT 347 12/28/2022    Lab Results  Component Value Date   CREATININE 0.73 12/28/2022   BUN 7 12/28/2022   NA 139 12/28/2022   K 4.7 12/28/2022   CL 102 12/28/2022   CO2 25 12/28/2022    Lab Results  Component Value Date   ALT 15 10/04/2022   AST 14 10/04/2022   ALKPHOS 222 (H) 08/06/2022   BILITOT 0.3 10/04/2022    Lab Results  Component Value Date   CHOL 182 02/24/2011   HDL 74.60 02/24/2011   LDLCALC 92 02/24/2011   TRIG 78.0 02/24/2011   CHOLHDL 2 02/24/2011   No results found for: "LABRPR", "RPRTITER" No results found for: "HIV1RNAQUANT", "HIV1RNAVL", "CD4TABS"  Microbiology Results for orders placed or performed during the hospital encounter of 09/06/22  Surgical pcr screen     Status: Abnormal   Collection Time: 09/06/22 10:14 AM   Specimen: Nasal Mucosa; Nasal Swab  Result Value Ref Range Status   MRSA, PCR NEGATIVE NEGATIVE Final   Staphylococcus aureus POSITIVE (A) NEGATIVE Final    Comment: (NOTE) The Xpert SA Assay (FDA approved for NASAL specimens in patients 33 years of age and older), is one component of a comprehensive surveillance program. It is not intended to diagnose infection nor to guide or monitor treatment. Performed at [facility], [address], [place], [place] [address]   Fungus Culture With Stain     Status: None   Collection Time: 09/06/22  1:40 PM   Specimen: Synovial, Right Shoulder; Body Fluid  Result Value Ref Range Status   Fungus Stain Final report  Final   Fungus (Mycology) Culture Final report  Final    Comment: (NOTE) Performed At: [facility] [address] [place], [place] [id number] Jolene Schimke MD [id number]:[id number]    Fungal Source TISSUE  Final    Comment: Performed at [facility], [address], [place], [place] [address]  Fungus Culture With Stain     Status: None   Collection  Time: 09/06/22  1:40 PM   Specimen: Synovial, Right Shoulder; Body Fluid  Result Value Ref Range Status   Fungus Stain Final report  Final   Fungus (Mycology) Culture Final report  Final    Comment: (NOTE) Performed At: [facility] [address] [place], [place] [id number] Jolene Schimke MD [id number]    Fungal Source TISSUE  Final    Comment: Performed at [facility], [address], [place], [place] [address]  Fungus Culture With Stain     Status: None   Collection Time: 09/06/22  1:40 PM   Specimen: Synovial, Right Shoulder; Body Fluid  Result Value Ref Range Status   Fungus Stain Final report  Final   Fungus (Mycology) Culture Final report  Final    Comment: (NOTE) Performed At: [facility] [address] [place], [place] [id number] Jolene Schimke MD [id number]:[id number]    Fungal Source TISSUE  Final    Comment: Performed at [facility], [address] [place], [place] [address]  Aerobic/Anaerobic Culture w Gram Stain (surgical/deep wound)     Status: None   Collection Time: 09/06/22  1:40 PM   Specimen: Synovial, Right Shoulder; Body Fluid  Result Value Ref Range Status   Specimen Description   Final    TISSUE Performed at [facility], [address], [place], [place] [address]    Special Requests R SHOULDER HOLD 21 DAYS  Final  Gram Stain NO WBC SEEN NO ORGANISMS SEEN   Final   Culture   Final    NO GROWTH 21 DAYS CONTINUING TO HOLD Performed at [facility], [address] [place], [place] [address]    Report Status 09/27/2022 FINAL  Final  Aerobic/Anaerobic Culture w Gram Stain (surgical/deep wound)     Status: None   Collection Time: 09/06/22  1:40 PM   Specimen: Synovial, Right Shoulder; Body Fluid  Result Value Ref Range Status   Specimen Description   Final    TISSUE Performed at [facility], [address] [place], [place] [address]    Special  Requests   Final    R SHOULDER Performed at [facility], [address] [place], [place] [address]    Gram Stain NO WBC SEEN NO ORGANISMS SEEN   Final   Culture   Final    RARE PROPIONIBACTERIUM ACNES Standardized susceptibility testing for this organism is not available. Performed at [facility], [address], [place], [place] [address]    Report Status 09/11/2022 FINAL  Final  Aerobic/Anaerobic Culture w Gram Stain (surgical/deep wound)     Status: None   Collection Time: 09/06/22  1:40 PM   Specimen: Synovial, Right Shoulder; Body Fluid  Result Value Ref Range Status   Specimen Description   Final    TISSUE Performed at [facility], [address] [place], [place] [address]    Special Requests   Final    R SHOULDER Performed at [facility], [address], [place], [place] [address]    Gram Stain NO WBC SEEN NO ORGANISMS SEEN   Final   Culture   Final    RARE PROPIONIBACTERIUM ACNES NO ANAEROBES ISOLATED CRITICAL RESULT CALLED TO, READ BACK BY AND VERIFIED WITH: RN [email or web address].P AT 0913 ON 09/25/2022 BY T.SAAD Performed at [facility], [address] [place], [place] [address]    Report Status 09/25/2022 FINAL  Final  Fungus Culture Result     Status: None   Collection Time: 09/06/22  1:40 PM  Result Value Ref Range Status   Result 1 Comment  Final    Comment: (NOTE) KOH/Calcofluor preparation:  no fungus observed. Performed At: [facility] [address] [place], [place] [id number] Jolene Schimke MD OV:[id number]   Fungus Culture Result     Status: None   Collection Time: 09/06/22  1:40 PM  Result Value Ref Range Status   Result 1 Comment  Final    Comment: (NOTE) KOH/Calcofluor preparation:  no fungus observed. Performed At: [facility] [address] [place], [place] [id number] Jolene Schimke MD [id number]:[id number]   Fungus Culture Result     Status: None   Collection  Time: 09/06/22  1:40 PM  Result Value Ref Range Status   Result 1 Comment  Final    Comment: (NOTE) KOH/Calcofluor preparation:  no fungus observed. Performed At: [facility] [address] [place], [place] [id number] Jolene Schimke MD CW:[id number]   Fungal organism reflex     Status: None   Collection Time: 09/06/22  1:40 PM  Result Value Ref Range Status   Fungal result 1 Comment  Final    Comment: (NOTE) No yeast or mold isolated after 4 weeks. Performed At: [facility] [address] [place], [place] [id number] Jolene Schimke MD [id number]   Fungal organism reflex     Status: None   Collection Time: 09/06/22  1:40 PM  Result Value Ref Range Status   Fungal result 1 Comment  Final    Comment: (NOTE) No yeast or mold isolated after 4 weeks. Performed At: [facility] [address] [place], [place] [id number] Jolene Schimke MD WU:[id number]   Fungal organism reflex     Status: None   Collection Time: 09/06/22  1:40 PM  Result Value Ref Range Status   Fungal result 1 Comment  Final    Comment: (NOTE) No yeast or mold isolated after 4 weeks. Performed At: [facility] [address] [place], [place] [id number] Jolene Schimke MD VH:[id number]    Imaging No results found.  Assessment/Plan 50 Y O male with PMH as below including legal blindness, hypertension, hyperlipidemia, diabetes mellitus, gout, peripheral neuropathy,  gait instability with recurrent falls, Bilateral shoulder arthroplasties (  right shoulder reverse shoulder arthroplasty done with [employer] implants about four years ago in [place], left reverse total shoulder arthroplasty done by Dr. Beverely Low that has done well for about the last 10 years) with   # Rt shoulder PJI  # Medication management  - s/p rt revision reverse shoulder arthroplasty, open synovectomy and open treatment of shoulder dislocation. 2/3 Cx Proprionibacterium acnes, Fungal stain negative,  fungal cx  no growth  - Completed 6 weeks of IV abtx on 11/24/22 ( linezolid prior to that) after which started on amoxicillin 500mg  po tid from 7/13, plan for 3-6 months   Plan - Continue PO amoxicillin 500mg  po tid until mid december - Fu in 2 months  # Medication monitoring  - labs today   # Low back pain/Lumbar spinal stenosis - official results from MRI L spine 10/1 pending - follows Dr Steward Drone, possible plan for epidural injection. May need lumbar decompression as well.   I have personally spent 41  minutes involved in face-to-face and non-face-to-face activities for this patient on the day of the visit. Professional time spent includes the following activities: Preparing to see the patient (review of tests), Obtaining and/or reviewing separately obtained history (admission/discharge record), Performing a medically appropriate examination and/or evaluation , Ordering medications/tests/procedures, referring and communicating with other health care professionals, Documenting clinical information in the EMR, Independently interpreting results (not separately reported), Communicating results to the patient/family/caregiver, Counseling and educating the patient/family/caregiver and Care coordination (not separately reported).   Victoriano Lain, MD [facility] 02/27/2023, 2:24 PM

## 2023-02-28 LAB — CBC
HCT: 42.8 % (ref 38.5–50.0)
Hemoglobin: 14.3 g/dL (ref 13.2–17.1)
MCH: 29.4 pg (ref 27.0–33.0)
MCHC: 33.4 g/dL (ref 32.0–36.0)
MCV: 88.1 fL (ref 80.0–100.0)
MPV: 9.9 fL (ref 7.5–12.5)
Platelets: 291 10*3/uL (ref 140–400)
RBC: 4.86 10*6/uL (ref 4.20–5.80)
RDW: 12.7 % (ref 11.0–15.0)
WBC: 9.8 10*3/uL (ref 3.8–10.8)

## 2023-02-28 LAB — BASIC METABOLIC PANEL
BUN: 7 mg/dL (ref 7–25)
CO2: 27 mmol/L (ref 20–32)
Calcium: 9.5 mg/dL (ref 8.6–10.3)
Chloride: 98 mmol/L (ref 98–110)
Creat: 0.87 mg/dL (ref 0.70–1.22)
Glucose, Bld: 115 mg/dL — ABNORMAL HIGH (ref 65–99)
Potassium: 4.3 mmol/L (ref 3.5–5.3)
Sodium: 135 mmol/L (ref 135–146)

## 2023-02-28 LAB — SEDIMENTATION RATE: Sed Rate: 17 mm/h (ref 0–20)

## 2023-02-28 LAB — C-REACTIVE PROTEIN: CRP: 14.1 mg/L — ABNORMAL HIGH (ref <8.0)

## 2023-03-02 NOTE — Assessment & Plan Note (Signed)
Patient reports mass/increased prominence over back of neck.  Uncertain how long this has been present, has been at least several months or more.  No significant pain from this mass. On exam, area of increased prominence over back of the neck that is soft on palpation, no fluctuance noted.  No tenderness with palpation.  No erythema present. Area appears most consistent with lipoma.  Discussed diagnosis and treatment considerations related to this.  For now, patient wishes to continue with monitoring.  Advised that if he does change his mind, to let us know and we can proceed with referral to dermatology for further evaluation and treatment

## 2023-03-02 NOTE — Assessment & Plan Note (Signed)
Over posterior aspect of left leg, patient does have skin lesion present which was first noticed a few months ago.  He denies any pain, bleeding, itching. On exam, there is a skin lesion with stuck on appearance, slightly darker than surrounding skin.  No signs of bleeding, erythema, scaling or crusting. Suspect likely SK, discussed this and treatment considerations.  Likely can continue with monitoring for now.  If changes do occur or if patient would like to proceed with referral to dermatology we can place referral at any time

## 2023-03-07 ENCOUNTER — Other Ambulatory Visit: Payer: Self-pay

## 2023-03-07 ENCOUNTER — Ambulatory Visit: Payer: Medicare Other | Admitting: Physical Medicine and Rehabilitation

## 2023-03-07 VITALS — BP 159/81 | HR 81

## 2023-03-07 DIAGNOSIS — M5416 Radiculopathy, lumbar region: Secondary | ICD-10-CM | POA: Diagnosis not present

## 2023-03-07 MED ORDER — METHYLPREDNISOLONE ACETATE 40 MG/ML IJ SUSP
40.0000 mg | Freq: Once | INTRAMUSCULAR | Status: AC
Start: 2023-03-07 — End: 2023-03-07
  Administered 2023-03-07: 40 mg

## 2023-03-07 NOTE — Progress Notes (Signed)
Functional Pain Scale - descriptive words and definitions  Uncomfortable (3)  Pain is present but can complete all ADL's/sleep is slightly affected and passive distraction only gives marginal relief. Mild range order  Average Pain  varies, only has pain when standing   +Driver, -BT, -Dye Allergies.  Lower back pain on right side that can radiate into the right leg

## 2023-03-07 NOTE — Patient Instructions (Signed)

## 2023-03-14 ENCOUNTER — Ambulatory Visit: Payer: Self-pay | Admitting: General Surgery

## 2023-03-14 NOTE — Progress Notes (Signed)
Kenneth Henson - 82 y.o. male MRN [id number]  Date of birth: 10/11/1940  Office Visit Note: Visit Date: 03/07/2023 PCP: de [place], [name], MD Referred by: Huel Cote, MD  Subjective: Chief Complaint  Patient presents with   Lower Back - Pain   HPI:  Kenneth Henson is a 82 y.o. male who comes in today at the request of Dr. Huel Cote for planned Right L5-S1 Lumbar Interlaminar epidural steroid injection with fluoroscopic guidance.  The patient has failed conservative care including home exercise, medications, time and activity modification.  This injection will be diagnostic and hopefully therapeutic.  Please see requesting physician notes for further details and justification.   ROS Otherwise per HPI.  Assessment & Plan: Visit Diagnoses:    ICD-10-CM   1. Lumbar radiculopathy  M54.16 XR C-ARM NO REPORT    Epidural Steroid injection    methylPREDNISolone acetate (DEPO-MEDROL) injection 40 mg      Plan: No additional findings.   Meds & Orders:  Meds ordered this encounter  Medications   methylPREDNISolone acetate (DEPO-MEDROL) injection 40 mg    Orders Placed This Encounter  Procedures   XR C-ARM NO REPORT   Epidural Steroid injection    Follow-up: Return for visit to requesting provider as needed.   Procedures: No procedures performed  Lumbar Epidural Steroid Injection - Interlaminar Approach with Fluoroscopic Guidance  Patient: Kenneth Henson      Date of Birth: 11-May-1941 MRN: [id number] PCP: de [place], [name], MD      Visit Date: 03/07/2023   Universal Protocol:     Consent Given By: the patient  Position: PRONE  Additional Comments: Vital signs were monitored before and after the procedure. Patient was prepped and draped in the usual sterile fashion. The correct patient, procedure, and site was verified.   Injection Procedure Details:   Procedure diagnoses: Lumbar radiculopathy [M54.16]   Meds Administered:  Meds ordered this  encounter  Medications   methylPREDNISolone acetate (DEPO-MEDROL) injection 40 mg     Laterality: Right  Location/Site:  L5-S1  Needle: 4.5 in., 20 ga. Tuohy  Needle Placement: Paramedian epidural  Findings:   -Comments: Excellent flow of contrast into the epidural space.  Procedure Details: Using a paramedian approach from the side mentioned above, the region overlying the inferior lamina was localized under fluoroscopic visualization and the soft tissues overlying this structure were infiltrated with 4 ml. of 1% Lidocaine without Epinephrine. The Tuohy needle was inserted into the epidural space using a paramedian approach.   The epidural space was localized using loss of resistance along with counter oblique bi-planar fluoroscopic views.  After negative aspirate for air, blood, and CSF, a 2 ml. volume of Isovue-250 was injected into the epidural space and the flow of contrast was observed. Radiographs were obtained for documentation purposes.    The injectate was administered into the level noted above.   Additional Comments:  No complications occurred Dressing: 2 x 2 sterile gauze and Band-Aid    Post-procedure details: Patient was observed during the procedure. Post-procedure instructions were reviewed.  Patient left the clinic in stable condition.   Clinical History: MRI LUMBAR SPINE WITHOUT CONTRAST   TECHNIQUE: Multiplanar, multisequence MR imaging of the lumbar spine was performed. No intravenous contrast was administered.   COMPARISON:  Radiography 08/06/2022   FINDINGS: Segmentation: 5 lumbar type vertebral bodies. L5 is somewhat transitional with an articulation on right.   Alignment:  2 mm degenerative anterolisthesis L4-5.   Vertebrae: No  fracture or focal bone lesion. Chronic fusion across the L1-2 disc space with prominent anterior osteophytes. Bridging osteophytes at the other levels of the lumbar region, better shown by radiography. Question  diffuse idiopathic skeletal hyperostosis.   Conus medullaris and cauda equina: Conus extends to the L1 level. Conus and cauda equina appear normal.   Paraspinal and other soft tissues: Negative   Disc levels:   T12-L1: Bridging osteophytes. No disc bulge or herniation. No stenosis.   L1-2: Bridging osteophytes.  No stenosis.   L2-3: Osteophytes without definite solid bridging. Endplate osteophytes and bulging of the disc. Mild stenosis of the canal and lateral recesses but no definite neural compression.   L3-4: Osteophytes without definite solid bridging. Endplate osteophytes and bulging of the disc. Mild facet and ligamentous hypertrophy. Mild stenosis of both lateral recesses. Bilateral foraminal narrowing. Neural compression could occur at this level.   L4-5: Osteophytes without apparent solid bridging. Facet osteoarthritis with degenerative anterolisthesis of 2 mm. Bulging of the disc. 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   L5-S1: This is a transitional level with a transitional articulation on the right. Normal appearance of the disc. Mild facet osteoarthritis. No compressive stenosis.   IMPRESSION: 1. L5 is transitional with a transitional articulation on the right. 2. Chronic fusion across the L1-2 disc space with prominent anterior osteophytes. Bridging osteophytes at the other levels of the lumbar region, better shown by radiography. Question diffuse idiopathic skeletal hyperostosis. 3. L2-3: Mild stenosis of the canal and lateral recesses but no definite neural compression. 4. L3-4: Mild stenosis of both lateral recesses and bilateral foramina. Neural compression could occur at this level. 5. 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 Neural compression likely at this level.     Electronically Signed   By: Paulina Fusi M.D.   On: 03/07/2023  10:45     Objective:  VS:  HT:    WT:   BMI:     BP:(!) 159/81  HR:81bpm  TEMP: ( )  RESP:  Physical Exam Vitals and nursing note reviewed.  Constitutional:      General: He is not in acute distress.    Appearance: Normal appearance. He is obese. He is not ill-appearing.  HENT:     Head: Normocephalic and atraumatic.     Right Ear: External ear normal.     Left Ear: External ear normal.     Nose: No congestion.  Eyes:     Extraocular Movements: Extraocular movements intact.  Cardiovascular:     Rate and Rhythm: Normal rate.     Pulses: Normal pulses.  Pulmonary:     Effort: Pulmonary effort is normal. No respiratory distress.  Abdominal:     General: There is no distension.     Palpations: Abdomen is soft.  Musculoskeletal:        General: No tenderness or signs of injury.     Cervical back: Neck supple.     Right lower leg: No edema.     Left lower leg: No edema.     Comments: Patient has good distal strength without clonus.  Skin:    Findings: No erythema or rash.  Neurological:     General: No focal deficit present.     Mental Status: He is alert and oriented to person, place, and time.     Sensory: No sensory deficit.     Motor: No weakness or abnormal muscle tone.  Coordination: Coordination normal.  Psychiatric:        Mood and Affect: Mood normal.        Behavior: Behavior normal.      Imaging: No results found.

## 2023-03-14 NOTE — Procedures (Signed)
Lumbar Epidural Steroid Injection - Interlaminar Approach with Fluoroscopic Guidance  Patient: Kenneth Henson      Date of Birth: 15-Aug-1940 MRN: [id number] PCP: de [place], [name], MD      Visit Date: 03/07/2023   Universal Protocol:     Consent Given By: the patient  Position: PRONE  Additional Comments: Vital signs were monitored before and after the procedure. Patient was prepped and draped in the usual sterile fashion. The correct patient, procedure, and site was verified.   Injection Procedure Details:   Procedure diagnoses: Lumbar radiculopathy [M54.16]   Meds Administered:  Meds ordered this encounter  Medications   methylPREDNISolone acetate (DEPO-MEDROL) injection 40 mg     Laterality: Right  Location/Site:  L5-S1  Needle: 4.5 in., 20 ga. Tuohy  Needle Placement: Paramedian epidural  Findings:   -Comments: Excellent flow of contrast into the epidural space.  Procedure Details: Using a paramedian approach from the side mentioned above, the region overlying the inferior lamina was localized under fluoroscopic visualization and the soft tissues overlying this structure were infiltrated with 4 ml. of 1% Lidocaine without Epinephrine. The Tuohy needle was inserted into the epidural space using a paramedian approach.   The epidural space was localized using loss of resistance along with counter oblique bi-planar fluoroscopic views.  After negative aspirate for air, blood, and CSF, a 2 ml. volume of Isovue-250 was injected into the epidural space and the flow of contrast was observed. Radiographs were obtained for documentation purposes.    The injectate was administered into the level noted above.   Additional Comments:  No complications occurred Dressing: 2 x 2 sterile gauze and Band-Aid    Post-procedure details: Patient was observed during the procedure. Post-procedure instructions were reviewed.  Patient left the clinic in stable condition.

## 2023-03-22 ENCOUNTER — Encounter: Payer: Self-pay | Admitting: Orthopedic Surgery

## 2023-03-22 ENCOUNTER — Ambulatory Visit: Payer: Medicare Other | Admitting: Orthopedic Surgery

## 2023-03-22 ENCOUNTER — Other Ambulatory Visit (INDEPENDENT_AMBULATORY_CARE_PROVIDER_SITE_OTHER): Payer: Medicare Other

## 2023-03-22 VITALS — BP 164/91 | HR 77 | Ht 68.0 in | Wt 256.0 lb

## 2023-03-22 DIAGNOSIS — M5416 Radiculopathy, lumbar region: Secondary | ICD-10-CM

## 2023-03-22 MED ORDER — TIZANIDINE HCL 4 MG PO TABS: 4.0000 mg | ORAL_TABLET | Freq: Three times a day (TID) | ORAL | 0 refills | Status: DC | PRN

## 2023-03-22 NOTE — Progress Notes (Signed)
Orthopedic Spine Surgery Office Note  Assessment: Patient is a 82 y.o. male with low back pain that radiates into the bilateral buttock and right lateral thigh.  Has stenosis at L4/5   Plan: -Explained that initially conservative treatment is tried as a significant number of patients may experience relief with these treatment modalities. Discussed that the conservative treatments include:  -activity modification  -physical therapy  -over the counter pain medications  -medrol dosepak  -lumbar steroid injections -Patient has tried Tylenol, ibuprofen, gabapentin, steroid injection -Recommended lidocaine patch and tizanidine.  Tizanidine was prescribed him today -If these treatments do not work, then I told him his options would be surgical decompression or pain management -Patient should return to office in 5 weeks, x-rays at next visit: None   Patient expressed understanding of the plan and all questions were answered to the patient's satisfaction.   ___________________________________________________________________________   History:  Patient is a 82 y.o. male who presents today for lumbar spine.  Patient has had over 1 year of low back pain that radiates into the bilateral buttocks and the right lateral thigh.  He feels that the pain has gotten progressively worse with time.  There is no trauma or injury that preceded the onset of pain.  He notes pain only when he is standing or walking.  Pain immediately resolves when he sits down.  He also notes pain is better if he leans over a shopping cart.  He has tried gabapentin, Tylenol, and ibuprofen but has not found lasting relief with any of these treatments.  He more recently got a lumbar steroid injection which she said gave him a day or 2 of great relief but then pain returned.   Weakness: Denies Paresthesias and numbness: Yes, has numbness and tingling in his bilateral feet from neuropathy.  No recent changes Bowel or bladder  incontinence: Denies Saddle anesthesia: Denies  Treatments tried: Tylenol, ibuprofen, gabapentin, steroid injection  Review of systems: Denies fevers and chills, night sweats, unexplained weight loss, history of cancer, pain that wakes them at night  Past medical history: DM (last A1c was 6.1 on 12/07/2022) HLD HTN  Allergies: doxycycline, percocet  Past surgical history:  Right carpal tunnel release Right proximal humerus fracture ORIF Right total shoulder arthoplasty Neck surgery  Social history: Denies use of nicotine product (smoking, vaping, patches, smokeless) Alcohol use: yes, approximately 3 drinks per week Denies recreational drug use   Physical Exam:  BMI of 38.9  General: no acute distress, appears stated age Neurologic: alert, answering questions appropriately, following commands Respiratory: unlabored breathing on room air, symmetric chest rise Psychiatric: appropriate affect, normal cadence to speech   MSK (spine):  -Strength exam      Left  Right EHL    5/5  5/5 TA    5/5  5/5 GSC    5/5  5/5 Knee extension  5/5  5/5 Hip flexion   5/5  5/5  -Sensory exam    Sensation intact to light touch in L3-S1 nerve distributions of bilateral lower extremities  -Achilles DTR: 1/4 on the left, 1/4 on the right -Patellar tendon DTR: 2/4 on the left, 2/4 on the right  -Straight leg raise: negative bilaterally -Femoral nerve stretch test: negative bilaterally -Clonus: no beats bilaterally  -Left hip exam: no pain through range of motion, negative stinchfield, negative faber, negative SI joint compression test -Right hip exam: no pain through range of motion, negative stinchfield, negative faber, negative SI joint compression test  Imaging: XRs of the  lumbar spine from 03/22/2023 were independently reviewed and interpreted, showing syndesmophytes seen at T11/T12 and T12/L1. Disc height loss at L3/4 and L4/5. No fracture or dislocation seen. No evidence of  instability on flexion/extension views.   MRI of the lumbar spine from 02/13/2023 was independently reviewed and interpreted, showing bilateral foraminal stenosis at L3/4 and L4/5. Central and lateral recess stenosis at L4/5.    Patient name: Kenneth Henson Patient MRN: [id number] Date of visit: 03/22/23

## 2023-04-02 ENCOUNTER — Ambulatory Visit (INDEPENDENT_AMBULATORY_CARE_PROVIDER_SITE_OTHER): Payer: Medicare Other | Admitting: Family Medicine

## 2023-04-02 ENCOUNTER — Encounter (HOSPITAL_BASED_OUTPATIENT_CLINIC_OR_DEPARTMENT_OTHER): Payer: Self-pay | Admitting: Family Medicine

## 2023-04-02 ENCOUNTER — Encounter (HOSPITAL_BASED_OUTPATIENT_CLINIC_OR_DEPARTMENT_OTHER): Payer: Self-pay | Admitting: *Deleted

## 2023-04-02 VITALS — BP 134/78 | HR 66 | Ht 68.0 in | Wt 256.0 lb

## 2023-04-02 DIAGNOSIS — E1142 Type 2 diabetes mellitus with diabetic polyneuropathy: Secondary | ICD-10-CM | POA: Diagnosis not present

## 2023-04-02 MED ORDER — GABAPENTIN 300 MG PO CAPS: ORAL_CAPSULE | ORAL | 3 refills | Status: DC

## 2023-04-02 NOTE — Assessment & Plan Note (Signed)
Patient reports issues with burning/pain in bilateral feet.  He has been using gabapentin, typically takes 1 at bedtime and will take another capsule some days as needed for increased pain.  This was initially prescribed by podiatrist whom he saw in May 2023.  He has not had any further follow-up of that time. Foot exam completed today, does have recurrent degree of decree sensation to bilateral feet.  No current ulcers or skin breakdown observed. Discussed options today, given that he has been utilizing gabapentin with some control of symptoms, feel that taking medication more regularly through the day would likely be beneficial for patient.  Discussed gradual titration of this medication with regular use throughout the day to see if this may better control symptoms.  Also recommend that he schedule follow-up visit with podiatrist that he was seeing earlier last year.  With making dose adjustment to gabapentin, can determine if symptoms are better controlled with this or if further adjustments in treatment are necessary when meeting with podiatrist

## 2023-04-02 NOTE — Progress Notes (Signed)
    Procedures performed today:    None.  Independent interpretation of notes and tests performed by another provider:   None.  Brief History, Exam, Impression, and Recommendations:    BP 134/78 (BP Location: Right Arm, Patient Position: Sitting, Cuff Size: Normal)   Pulse 66   Ht 5\' 8"  (1.727 m)   Wt 256 lb (116.1 kg)   SpO2 95%   BMI 38.92 kg/m   Type 2 diabetes mellitus with diabetic polyneuropathy, without long-term current use of insulin (HCC) Assessment & Plan: Patient reports issues with burning/pain in bilateral feet.  He has been using gabapentin, typically takes 1 at bedtime and will take another capsule some days as needed for increased pain.  This was initially prescribed by podiatrist whom he saw in May 2023.  He has not had any further follow-up of that time. Foot exam completed today, does have recurrent degree of decree sensation to bilateral feet.  No current ulcers or skin breakdown observed. Discussed options today, given that he has been utilizing gabapentin with some control of symptoms, feel that taking medication more regularly through the day would likely be beneficial for patient.  Discussed gradual titration of this medication with regular use throughout the day to see if this may better control symptoms.  Also recommend that he schedule follow-up visit with podiatrist that he was seeing earlier last year.  With making dose adjustment to gabapentin, can determine if symptoms are better controlled with this or if further adjustments in treatment are necessary when meeting with podiatrist  Orders: -     Microalbumin / creatinine urine ratio  Other orders -     Gabapentin; Take 1 tab before bed x 2 weeks.  May increase to 1 tab at dinner for 2 weeks and then may increase to am, dinner and before bed thereafter for pain.  Dispense: 90 capsule; Refill: 3  Return if symptoms worsen or fail to improve.   ___________________________________________ Gracianna Vink de  [place], MD, ABFM, CAQSM Primary Care and Sports Medicine [facility]

## 2023-04-03 LAB — MICROALBUMIN / CREATININE URINE RATIO
Creatinine, Urine: 83.3 mg/dL
Microalb/Creat Ratio: 53 mg/g{creat} — ABNORMAL HIGH (ref 0–29)
Microalbumin, Urine: 44.1 ug/mL

## 2023-04-20 ENCOUNTER — Ambulatory Visit (INDEPENDENT_AMBULATORY_CARE_PROVIDER_SITE_OTHER): Payer: Medicare Other

## 2023-04-20 ENCOUNTER — Ambulatory Visit (INDEPENDENT_AMBULATORY_CARE_PROVIDER_SITE_OTHER): Payer: Medicare Other | Admitting: Podiatry

## 2023-04-20 ENCOUNTER — Encounter: Payer: Self-pay | Admitting: Podiatry

## 2023-04-20 DIAGNOSIS — M7751 Other enthesopathy of right foot: Secondary | ICD-10-CM

## 2023-04-20 DIAGNOSIS — M778 Other enthesopathies, not elsewhere classified: Secondary | ICD-10-CM

## 2023-04-20 DIAGNOSIS — M7752 Other enthesopathy of left foot: Secondary | ICD-10-CM

## 2023-04-20 MED ORDER — TRIAMCINOLONE ACETONIDE 10 MG/ML IJ SUSP
10.0000 mg | Freq: Once | INTRAMUSCULAR | Status: AC
  Administered 2023-04-20: 10 mg via INTRA_ARTICULAR

## 2023-04-22 NOTE — Progress Notes (Signed)
Subjective:   Patient ID: Kenneth Henson, male   DOB: 82 y.o.   MRN: [id number]   HPI Patient presents stating he has a lot of forefoot pain of both feet and his sugar is under excellent control with obesity is complicating factor   ROS      Objective:  Physical Exam  Neurovascular status was found to be stable at this time with inflammation pain around the second and third MPJs of both feet with fluid buildup around the area and pain to palpation.     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

## 2023-04-23 ENCOUNTER — Encounter (HOSPITAL_COMMUNITY): Payer: Self-pay | Admitting: General Surgery

## 2023-04-23 NOTE — Progress Notes (Signed)
SDW call  Patient was given pre-op instructions over the phone. Patient verbalized understanding of instructions provided.     PCP - Dr. Ceasar Mons [place] Cardiologist -  Pulmonary:    PPM/ICD - denies Device Orders - na Rep Notified - na   Chest x-ray - 3/28/20204 EKG -  08/06/2022 Stress Test - ECHO - 08/08/2022 Cardiac Cath -   Sleep Study/sleep apnea/CPAP: Diagnosed with sleep apnea, wears CPAP occasionally  Type II diabetic, does not check his blood sugar Fasting Blood sugar range: unknown How often check sugars: never Metformin, instructed to hold the day of surgery   Blood Thinner Instructions: denies Aspirin Instructions:states last dose 04/19/2023   ERAS Protcol - Clears until 0530   COVID TEST- na     Anesthesia review: No   Patient denies shortness of breath, fever, cough and chest pain over the phone call  Your procedure is scheduled on Wednesday April 25, 2023  Report to [facility] Main Entrance "A" at  0630  A.M., then check in with the Admitting office.  Call this number if you have problems the morning of surgery:  [phone]   If you have any questions prior to your surgery date call [phone]: Open Monday-Friday 8am-4pm If you experience any cold or flu symptoms such as cough, fever, chills, shortness of breath, etc. between now and your scheduled surgery, please notify us at the above number    Remember:  Do not eat after midnight the night before your surgery  You may drink clear liquids until  0530   the morning of your surgery.   Clear liquids allowed are: Water, Non-Citrus Juices (without pulp), Carbonated Beverages, Clear Tea, Black Coffee ONLY (NO MILK, CREAM OR POWDERED CREAMER of any kind), and Gatorade   Take these medicines the morning of surgery with A SIP OF WATER:  Gabapentin, amoxicillin  As needed: allopurinol  As of today, STOP taking any Aspirin (unless otherwise instructed by your surgeon) Aleve, Naproxen, Ibuprofen,  Motrin, Advil, Goody's, BC's, all herbal medications, fish oil, and all vitamins.

## 2023-04-24 ENCOUNTER — Ambulatory Visit: Payer: Self-pay | Admitting: General Surgery

## 2023-04-24 ENCOUNTER — Ambulatory Visit: Payer: Medicare Other | Admitting: Infectious Diseases

## 2023-04-24 ENCOUNTER — Other Ambulatory Visit: Payer: Self-pay

## 2023-04-24 ENCOUNTER — Encounter: Payer: Self-pay | Admitting: Infectious Diseases

## 2023-04-24 ENCOUNTER — Encounter (HOSPITAL_COMMUNITY): Payer: Self-pay | Admitting: General Surgery

## 2023-04-24 VITALS — BP 161/80 | HR 71 | Resp 16 | Ht 68.0 in | Wt 252.2 lb

## 2023-04-24 DIAGNOSIS — R221 Localized swelling, mass and lump, neck: Secondary | ICD-10-CM

## 2023-04-24 DIAGNOSIS — T8459XD Infection and inflammatory reaction due to other internal joint prosthesis, subsequent encounter: Secondary | ICD-10-CM

## 2023-04-24 DIAGNOSIS — Z79899 Other long term (current) drug therapy: Secondary | ICD-10-CM

## 2023-04-24 DIAGNOSIS — T8450XD Infection and inflammatory reaction due to unspecified internal joint prosthesis, subsequent encounter: Secondary | ICD-10-CM

## 2023-04-24 NOTE — H&P (Signed)
REFERRING PHYSICIAN:  Mickel Crow, MD PROVIDER:  Lysle Rubens, MD MRN: [id number] DOB: 1941-02-09 HPI  Chief Complaint: Neck mass   History of Present Illness: Kenneth Henson is a 82 y.o. male who is seen as a consultation for evaluation of neck mass.   Patient was seen by Mickel Crow with [facility] on 03/06/23 for routine evaluation of skin lesions. At this time a lesion was noted on his right posterior neck that appeared consistent with lipoma measuring 5cm by 6cm but was noted to have rapidly increased in size per referral notes. Discussing with patient today, his daughter noted the lesion about 1 month ago and they feel like it has slightly decreased in size. It is nontender, does not cause any discomfort. Has not noted any overlying skin changes. No dysphonia or dysphagia. No neurologic deficits or associated MSK pain in the area.  Review of Systems: A complete review of systems was obtained from the patient.  I have reviewed this information and discussed as appropriate with the patient.  See HPI as well for other ROS.   Review of Systems  Constitutional:  Negative for chills and fever.  Eyes:        Decreased vision--baseline  Respiratory: Negative.    Cardiovascular: Negative.   Gastrointestinal:  Positive for constipation.  Genitourinary: Negative.   Musculoskeletal:  Positive for joint pain. Negative for neck pain.  Skin:  Positive for itching. Negative for rash.  Neurological: Negative.   Psychiatric/Behavioral:  The patient is nervous/anxious.   Objective  Past Medical History: Past Medical History:  Diagnosis Date   Abnormal involuntary movements(781.0)    Arthritis    Chicken pox    Claudication (HCC)    Colon polyps    Diabetes mellitus type II    Erectile dysfunction    Hyperlipidemia    Hypertension    Neoplasm of skin    uncertain behavior   Sleep apnea    Stroke [facility])     Past Surgical History: Past Surgical History:   Procedure Laterality Date   CARPAL TUNNEL RELEASE     right   HAND SURGERY     KNEE SURGERY     MINOR HARDWARE REMOVAL Right 09/06/2022   Procedure: MINOR HARDWARE REMOVAL;  Surgeon: Bjorn Pippin, MD;  Location: [facility];  Service: Orthopedics;  Laterality: Right;   NECK SURGERY     ORIF HUMERUS FRACTURE Right 09/06/2022   Procedure: OPEN REDUCTION INTERNAL FIXATION (ORIF) PROXIMAL HUMERUS FRACTURE;  Surgeon: Bjorn Pippin, MD;  Location: [facility];  Service: Orthopedics;  Laterality: Right;   SHOULDER SURGERY Right    2020   SHOULDER SURGERY Left    2011   TOTAL SHOULDER REVISION Right 09/06/2022   Procedure: TOTAL SHOULDER REVISION;  Surgeon: Bjorn Pippin, MD;  Location: [facility];  Service: Orthopedics;  Laterality: Right;    Family History:  Family History  Problem Relation Age of Onset   Hypertension Mother    Heart disease Mother    Stroke Father    Colon cancer Neg Hx    Stomach cancer Neg Hx    Breast cancer Sister    Colon polyps Sister     Social History:  reports that he quit smoking about 13 years ago. His smoking use included cigarettes. He has been exposed to tobacco smoke. He has never used smokeless tobacco. He reports current alcohol use of about 1.0 standard drink of alcohol per week. He reports that he does not use  drugs.  Allergies:  Allergies  Allergen Reactions   Doxycycline Nausea And Vomiting   Oxycodone-Acetaminophen Itching    Medications: I have reviewed the patient's current medications.   Physical Exam Constitutional:      General: He is not in acute distress.    Appearance: Normal appearance. He is obese.  HENT:     Head: Normocephalic and atraumatic.     Right Ear: External ear normal.     Left Ear: External ear normal.     Nose: Nose normal.     Mouth/Throat:     Mouth: Mucous membranes are moist.     Pharynx: Oropharynx is clear.  Eyes:     Pupils: Pupils are equal, round, and reactive to light.  Neck:     Comments: 5x6cm lesion on  posterior right neck without overlying skin changes. Nontender to palpation. Soft and mobile. Cardiovascular:     Rate and Rhythm: Normal rate and regular rhythm.  Musculoskeletal:        General: Swelling present.     Cervical back: Normal range of motion.     Comments: Normal ROM, requires cane for ambulation  Skin:    General: Skin is warm and dry.  Neurological:     General: No focal deficit present.     Mental Status: He is alert.  Psychiatric:        Mood and Affect: Mood normal.     Assessment: Kenneth Henson is an 82 y.o. male with posterior neck mass.  Plan for excision of mass on 04/25/23.  Discussed likelihood of lesion being a benign lipoma but tendency to continue to grow in size. We discussed excision with possibility of drain placement depending on the size of the defect after removal of the lipoma. We discussed risks of excision including but not limited to bleeding, infection, hematoma, seroma and lipoma recurring.    Donata Duff, MD [facility]

## 2023-04-24 NOTE — Anesthesia Preprocedure Evaluation (Signed)
Anesthesia Evaluation  Patient identified by MRN, date of birth, ID band Patient awake    Reviewed: Allergy & Precautions, NPO status , Patient's Chart, lab work & pertinent test results  Airway Mallampati: II  TM Distance: >3 FB     Dental no notable dental hx. (+) Edentulous Upper, Edentulous Lower   Pulmonary sleep apnea and Continuous Positive Airway Pressure Ventilation , former smoker   breath sounds clear to auscultation + decreased breath sounds      Cardiovascular hypertension, Pt. on medications Normal cardiovascular exam Rhythm:Regular Rate:Normal  EKG 08/06/22 Sinus rhythm Borderline prolonged PR interval Probable left atrial enlargement IVCD, consider atypical LBBB  Echo 08/08/22  1. Left ventricular ejection fraction, by estimation, is 55 to 60%. The  left ventricle has normal function. The left ventricle has no regional  wall motion abnormalities. There is mild concentric left ventricular  hypertrophy. Left ventricular diastolic  parameters are consistent with Grade II diastolic dysfunction  (pseudonormalization). Elevated left atrial pressure.   2. Right ventricular systolic function is normal. The right ventricular  size is normal. There is normal pulmonary artery systolic pressure. The  estimated right ventricular systolic pressure is 29.4 mmHg.   3. The mitral valve is normal in structure. Trivial mitral valve  regurgitation.   4. The aortic valve was not well visualized. There is mild calcification  of the aortic valve. There is mild thickening of the aortic valve. Aortic  valve regurgitation is not visualized. Aortic valve  sclerosis/calcification is present, without any  evidence of aortic stenosis.   5. The inferior vena cava is normal in size with greater than 50%  respiratory variability, suggesting right atrial pressure of 3 mmHg.      Neuro/Psych  Headaches  Neuromuscular disease CVA  negative  psych ROS   GI/Hepatic negative GI ROS, Neg liver ROS,,,  Endo/Other  diabetes, Well Controlled, Type 2, Oral Hypoglycemic Agents  Hyperlipidemia Obesity Gout  Renal/GU negative Renal ROS  negative genitourinary   Musculoskeletal  (+) Arthritis , Osteoarthritis,  Lipoma right posterior neck Chronic back pain   Abdominal  (+) + obese  Peds  Hematology negative hematology ROS (+)   Anesthesia Other Findings   Reproductive/Obstetrics ED                              Anesthesia Physical Anesthesia Plan  ASA: 3  Anesthesia Plan: General   Post-op Pain Management: Minimal or no pain anticipated   Induction: Intravenous  PONV Risk Score and Plan: 3 and Treatment may vary due to age or medical condition, Ondansetron and Dexamethasone  Airway Management Planned: Oral ETT  Additional Equipment: None  Intra-op Plan:   Post-operative Plan: Extubation in OR  Informed Consent: I have reviewed the patients History and Physical, chart, labs and discussed the procedure including the risks, benefits and alternatives for the proposed anesthesia with the patient or authorized representative who has indicated his/her understanding and acceptance.     Dental advisory given  Plan Discussed with: CRNA and Anesthesiologist  Anesthesia Plan Comments:          Anesthesia Quick Evaluation

## 2023-04-24 NOTE — H&P (View-Only) (Signed)
REFERRING PHYSICIAN:  Mickel Crow, MD PROVIDER:  Lysle Rubens, MD MRN: [id number] DOB: 1941-02-09 HPI  Chief Complaint: Neck mass   History of Present Illness: Kenneth Henson is a 82 y.o. male who is seen as a consultation for evaluation of neck mass.   Patient was seen by Mickel Crow with [facility] on 03/06/23 for routine evaluation of skin lesions. At this time a lesion was noted on his right posterior neck that appeared consistent with lipoma measuring 5cm by 6cm but was noted to have rapidly increased in size per referral notes. Discussing with patient today, his daughter noted the lesion about 1 month ago and they feel like it has slightly decreased in size. It is nontender, does not cause any discomfort. Has not noted any overlying skin changes. No dysphonia or dysphagia. No neurologic deficits or associated MSK pain in the area.  Review of Systems: A complete review of systems was obtained from the patient.  I have reviewed this information and discussed as appropriate with the patient.  See HPI as well for other ROS.   Review of Systems  Constitutional:  Negative for chills and fever.  Eyes:        Decreased vision--baseline  Respiratory: Negative.    Cardiovascular: Negative.   Gastrointestinal:  Positive for constipation.  Genitourinary: Negative.   Musculoskeletal:  Positive for joint pain. Negative for neck pain.  Skin:  Positive for itching. Negative for rash.  Neurological: Negative.   Psychiatric/Behavioral:  The patient is nervous/anxious.   Objective  Past Medical History: Past Medical History:  Diagnosis Date   Abnormal involuntary movements(781.0)    Arthritis    Chicken pox    Claudication (HCC)    Colon polyps    Diabetes mellitus type II    Erectile dysfunction    Hyperlipidemia    Hypertension    Neoplasm of skin    uncertain behavior   Sleep apnea    Stroke [facility])     Past Surgical History: Past Surgical History:   Procedure Laterality Date   CARPAL TUNNEL RELEASE     right   HAND SURGERY     KNEE SURGERY     MINOR HARDWARE REMOVAL Right 09/06/2022   Procedure: MINOR HARDWARE REMOVAL;  Surgeon: Bjorn Pippin, MD;  Location: [facility];  Service: Orthopedics;  Laterality: Right;   NECK SURGERY     ORIF HUMERUS FRACTURE Right 09/06/2022   Procedure: OPEN REDUCTION INTERNAL FIXATION (ORIF) PROXIMAL HUMERUS FRACTURE;  Surgeon: Bjorn Pippin, MD;  Location: [facility];  Service: Orthopedics;  Laterality: Right;   SHOULDER SURGERY Right    2020   SHOULDER SURGERY Left    2011   TOTAL SHOULDER REVISION Right 09/06/2022   Procedure: TOTAL SHOULDER REVISION;  Surgeon: Bjorn Pippin, MD;  Location: [facility];  Service: Orthopedics;  Laterality: Right;    Family History:  Family History  Problem Relation Age of Onset   Hypertension Mother    Heart disease Mother    Stroke Father    Colon cancer Neg Hx    Stomach cancer Neg Hx    Breast cancer Sister    Colon polyps Sister     Social History:  reports that he quit smoking about 13 years ago. His smoking use included cigarettes. He has been exposed to tobacco smoke. He has never used smokeless tobacco. He reports current alcohol use of about 1.0 standard drink of alcohol per week. He reports that he does not use  drugs.  Allergies:  Allergies  Allergen Reactions   Doxycycline Nausea And Vomiting   Oxycodone-Acetaminophen Itching    Medications: I have reviewed the patient's current medications.   Physical Exam Constitutional:      General: He is not in acute distress.    Appearance: Normal appearance. He is obese.  HENT:     Head: Normocephalic and atraumatic.     Right Ear: External ear normal.     Left Ear: External ear normal.     Nose: Nose normal.     Mouth/Throat:     Mouth: Mucous membranes are moist.     Pharynx: Oropharynx is clear.  Eyes:     Pupils: Pupils are equal, round, and reactive to light.  Neck:     Comments: 5x6cm lesion on  posterior right neck without overlying skin changes. Nontender to palpation. Soft and mobile. Cardiovascular:     Rate and Rhythm: Normal rate and regular rhythm.  Musculoskeletal:        General: Swelling present.     Cervical back: Normal range of motion.     Comments: Normal ROM, requires cane for ambulation  Skin:    General: Skin is warm and dry.  Neurological:     General: No focal deficit present.     Mental Status: He is alert.  Psychiatric:        Mood and Affect: Mood normal.     Assessment: Kenneth Henson is an 82 y.o. male with posterior neck mass.  Plan for excision of mass on 04/25/23.  Discussed likelihood of lesion being a benign lipoma but tendency to continue to grow in size. We discussed excision with possibility of drain placement depending on the size of the defect after removal of the lipoma. We discussed risks of excision including but not limited to bleeding, infection, hematoma, seroma and lipoma recurring.    Donata Duff, MD [facility]

## 2023-04-24 NOTE — Progress Notes (Addendum)
Patient Active Problem List   Diagnosis Date Noted   Spinal stenosis of lumbar region 02/27/2023   Insomnia 02/08/2023   Lipoma of neck 02/08/2023   Skin lesion 02/08/2023   Chronic bilateral low back pain with right-sided sciatica 12/07/2022   Prosthetic joint infection, subsequent encounter 10/19/2022   Medication management 10/04/2022   Status post reverse total arthroplasty of right shoulder 09/06/2022   Acute pain of right shoulder 08/11/2022   Synovial cyst of left popliteal space 08/11/2022   Fracture of left tibial plateau 08/06/2022   Need for influenza vaccination 03/01/2021   Pustular inflammation of skin 12/01/2020   Decreased visual acuity 12/01/2020   Moderately increased albuminuria 12/01/2020   Diabetes mellitus without complication (HCC) 10/27/2020   Vitreomacular adhesion of both eyes 10/27/2020   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 10/27/2020   Exudative age-related macular degeneration of left eye with active choroidal neovascularization (HCC) 10/27/2020   Diabetes (HCC) 09/01/2020   Retinal tear 09/01/2020   Headache 01/16/2012   Atypical nevus 11/07/2011   Trigger finger 05/22/2011   Bronchitis 05/22/2011   Preop cardiovascular exam 04/11/2011   Knee pain 02/24/2011   Bursitis of elbow 10/31/2010   OBESITY 04/28/2010   INCONTINENCE, URGE 04/28/2010   LEG PAIN, BILATERAL 12/08/2009   CHEST PAIN 12/08/2009   ELECTROCARDIOGRAM, ABNORMAL 12/08/2009   NEOPLASM, SKIN, UNCERTAIN BEHAVIOR 12/06/2009   PREMATURE ATRIAL CONTRACTIONS 12/06/2009   History of colonic polyps 11/17/2009   ERECTILE DYSFUNCTION, 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   Hyperlipidemia 09/29/2009   TOBACCO USE 09/29/2009   Essential hypertension 09/29/2009   SHOULDER, PAIN 09/29/2009   HIP PAIN, LEFT 09/29/2009   Abnormal involuntary movement 09/29/2009   Current Outpatient Medications on File Prior to Visit  Medication Sig Dispense  Refill   allopurinol (ZYLOPRIM) 300 MG tablet Take 300 mg by mouth every 12 (twelve) hours as needed (gout).     amoxicillin (AMOXIL) 500 MG tablet Take 1 tablet (500 mg total) by mouth 3 (three) times daily. (Patient taking differently: Take 500 mg by mouth 3 (three) times daily. Post shoulder surgery) 90 tablet 5   aspirin EC 81 MG tablet Take 81 mg by mouth daily. Swallow whole.     diphenhydrAMINE HCl, Sleep, (ZZZQUIL) 50 MG/30ML LIQD Take 50 mg by mouth daily as needed (sleep).     gabapentin (NEURONTIN) 300 MG capsule Take 1 tab before bed x 2 weeks.  May increase to 1 tab at dinner for 2 weeks and then may increase to am, dinner and before bed thereafter for pain. (Patient taking differently: Take 300 mg by mouth daily.) 90 capsule 3   ibuprofen (ADVIL) 200 MG tablet Take 400 mg by mouth every 6 (six) hours as needed for moderate pain (pain score 4-6) or mild pain (pain score 1-3).     lisinopril (PRINIVIL,ZESTRIL) 40 MG tablet Take 1 tablet (40 mg total) by mouth daily. 90 tablet 3   metFORMIN (GLUCOPHAGE) 850 MG tablet Take 850 mg by mouth daily. Takes it once in the morning, patient choice     Multiple Vitamins-Minerals (MULTIVITAMIN WITH MINERALS) tablet Take 1 tablet by mouth daily. centrum     simvastatin (ZOCOR) 20 MG tablet Take 20 mg by mouth at bedtime.     No current facility-administered medications on file prior to visit.    Subjective: 82 Y O male with PMH as below including legal blindness, hypertension, hyperlipidemia, diabetes mellitus, gout, peripheral neuropathy,  gait instability  with recurrent falls, Bilateral shoulder arthroplasties (  right shoulder reverse shoulder arthroplasty done with [employer] implants about four years ago in [place], left reverse total shoulder arthroplasty done by Dr. Beverely Low that has done well for about the last 10 years) who is referred from Ortho for concerns of rt shoulder PJI. S/p (09/06/22)Revision of reverse rt shoulder  arthroplasty, open synovectomy and open reduction of rt shoulder dislocation. OR cx 2/3 samples rare proprionibacterium acnes. Fungal stain negative, cx pending. Patient was admitted 3/24-3/29 for fall. Orthopedics was consulted for left toe, left lateral tibial plateaue # and left lateral malleolus  # and was advised conservative management.   3/24 CT rt shoulder: IMPRESSION: 1. 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 2. Moderate-large volume of fluid within the subacromial-subdeltoid bursal space suggesting bursitis.  He did not have any fevers, chills or concerns of infection at the rt shoulder prior to the fall. Denies taking abtx prior to visit. He has no pain, tenderness, swelling at rt shoulder except limitation of ROM. Seen by Ortho yesterday. He had Vascular US of LLE today which was negative for DVT.   I spoke with Daughter [name] per patient's request to discuss plan of care. She said she will be back to [facility] by Sunday and will help  her father with receiving IV abtx at home. Patient lives in an apartment and also has a caretaker with him. Reports being legally blind but able to count fingers. Denies smoking, alcohol and IVDU. He has no complaints otherwise.   Started IV penicillin from 5/31. Taking linezolid prior to that without missing doses. Completed 6 weeks course on7/12 after which started on suppressive PO amoxicillin 500mg  po tid   12/10 Accompanied by his grand son. Taking po amoxicillin as prescribed, no concerns with abtx. He is scheduled for Lipoma removal of his neck tomorrow. ROM of left shoulder has significantly improved. He is following with Dr Christell Constant for lumbar stenosis/radicular back pain. Seen nby surgery 10/30 for lipoma of neck and plan for removal tomorrow.  Seen by PCP on 11/18 and Podiatry for inflammatory capsulitis of bilateral MPJ and received steroid injections in his 2nd and 3rd MPJ  b/l. No concerns otherwise.   Review of Systems: all systems reviewed with pertinent positives and negatives as listed above   Past Medical History:  Diagnosis Date   Abnormal involuntary movements(781.0)    Arthritis    Chicken pox    Claudication (HCC)    Colon polyps    Diabetes mellitus type II    Erectile dysfunction    Hyperlipidemia    Hypertension    Neoplasm of skin    uncertain behavior   Sleep apnea    Stroke [facility])    Past Surgical History:  Procedure Laterality Date   CARPAL TUNNEL RELEASE     right   HAND SURGERY     KNEE SURGERY     MINOR HARDWARE REMOVAL Right 09/06/2022   Procedure: MINOR HARDWARE REMOVAL;  Surgeon: Bjorn Pippin, MD;  Location: [facility];  Service: Orthopedics;  Laterality: Right;   NECK SURGERY     ORIF HUMERUS FRACTURE Right 09/06/2022   Procedure: OPEN REDUCTION INTERNAL FIXATION (ORIF) PROXIMAL HUMERUS FRACTURE;  Surgeon: Bjorn Pippin, MD;  Location: [facility];  Service: Orthopedics;  Laterality: Right;   SHOULDER SURGERY Right    2020   SHOULDER SURGERY Left    2011   TOTAL SHOULDER REVISION Right 09/06/2022  Procedure: TOTAL SHOULDER REVISION;  Surgeon: Bjorn Pippin, MD;  Location: [facility];  Service: Orthopedics;  Laterality: Right;    Social History   Tobacco Use   Smoking status: Former    Current packs/day: 0.00    Types: Cigarettes    Quit date: 05/15/2009    Years since quitting: 13.9    Passive exposure: Past   Smokeless tobacco: Never  Vaping Use   Vaping status: Never Used  Substance Use Topics   Alcohol use: Yes    Alcohol/week: 1.0 standard drink of alcohol    Types: 1 Shots of liquor per week    Comment: vodka, weekly   Drug use: No    Family History  Problem Relation Age of Onset   Hypertension Mother    Heart disease Mother    Stroke Father    Colon cancer Neg Hx    Stomach cancer Neg Hx    Breast cancer Sister    Colon polyps Sister     Allergies  Allergen Reactions   Doxycycline Nausea And Vomiting    Oxycodone-Acetaminophen Itching    Health Maintenance  Topic Date Due   OPHTHALMOLOGY EXAM  07/07/2021   COVID-19 Vaccine (6 - 2023-24 season) 01/14/2023   Medicare Annual Wellness (AWV)  06/21/2023   HEMOGLOBIN A1C  06/09/2023   Diabetic kidney evaluation - eGFR measurement  02/27/2024   Diabetic kidney evaluation - Urine ACR  04/01/2024   FOOT EXAM  04/01/2024   DTaP/Tdap/Td (6 - Td or Tdap) 03/14/2028   Pneumonia Vaccine 23+ Years old  Completed   INFLUENZA VACCINE  Completed   Zoster Vaccines- Shingrix  Completed   HPV VACCINES  Aged Out   Colonoscopy  Discontinued    Objective: BP (!) 161/80   Pulse 71   Resp 16   Ht 5\' 8"  (1.727 m)   Wt 252 lb 3.2 oz (114.4 kg)   BMI 38.35 kg/m    Physical Exam Constitutional:      Appearance: Normal appearance. Morbidly obese, ambulatory with a cane.  HENT:     Head: Normocephalic and atraumatic.      Mouth: Mucous membranes are moist.  Eyes:    Conjunctiva/sclera: Conjunctivae normal.     Pupils: Pupils are equal, round, and bilaterally symmetrical   Cardiovascular:     Rate and Rhythm: Normal rate and regular rhythm.     Heart sounds:  Pulmonary:     Effort: Pulmonary effort is normal.     Breath sounds:  Abdominal:     General: Non distended     Palpations:  Musculoskeletal:        General:  RT shoulder reverse shoulder arthroplasty surgical site has healed. Significant improvement of ROM of rt shoulder.   Skin:    General: Skin is warm and dry.     Comments:  Neurological:     General: grossly non focal     Mental Status: awake, alert and oriented to person, place, and time.   Psychiatric:        Mood and Affect: Mood normal.   Lab Results Lab Results  Component Value Date   WBC 9.8 02/27/2023   HGB 14.3 02/27/2023   HCT 42.8 02/27/2023   MCV 88.1 02/27/2023   PLT 291 02/27/2023    Lab Results  Component Value Date   CREATININE 0.87 02/27/2023   BUN 7 02/27/2023   NA 135 02/27/2023   K 4.3  02/27/2023   CL 98 02/27/2023   CO2  27 02/27/2023    Lab Results  Component Value Date   ALT 15 10/04/2022   AST 14 10/04/2022   ALKPHOS 222 (H) 08/06/2022   BILITOT 0.3 10/04/2022    Lab Results  Component Value Date   CHOL 182 02/24/2011   HDL 74.60 02/24/2011   LDLCALC 92 02/24/2011   TRIG 78.0 02/24/2011   CHOLHDL 2 02/24/2011   No results found for: "LABRPR", "RPRTITER" No results found for: "HIV1RNAQUANT", "HIV1RNAVL", "CD4TABS"  Microbiology Results for orders placed or performed during the hospital encounter of 09/06/22  Surgical pcr screen     Status: Abnormal   Collection Time: 09/06/22 10:14 AM   Specimen: Nasal Mucosa; Nasal Swab  Result Value Ref Range Status   MRSA, PCR NEGATIVE NEGATIVE Final   Staphylococcus aureus POSITIVE (A) NEGATIVE Final    Comment: (NOTE) The Xpert SA Assay (FDA approved for NASAL specimens in patients 43 years of age and older), is one component of a comprehensive surveillance program. It is not intended to diagnose infection nor to guide or monitor treatment. Performed at [facility], [address], [place], [place] [address]   Fungus Culture With Stain     Status: None   Collection Time: 09/06/22  1:40 PM   Specimen: Synovial, Right Shoulder; Body Fluid  Result Value Ref Range Status   Fungus Stain Final report  Final   Fungus (Mycology) Culture Final report  Final    Comment: (NOTE) Performed At: [facility] [address] [place], [place] [id number] Jolene Schimke MD VO:[id number]    Fungal Source TISSUE  Final    Comment: Performed at [facility], [address], [place], [place] [address]  Fungus Culture With Stain     Status: None   Collection Time: 09/06/22  1:40 PM   Specimen: Synovial, Right Shoulder; Body Fluid  Result Value Ref Range Status   Fungus Stain Final report  Final   Fungus (Mycology) Culture Final report  Final    Comment: (NOTE) Performed At: [facility] [address] [place], [place] [id number] Jolene Schimke MD [id number]    Fungal Source TISSUE  Final    Comment: Performed at [facility], [address], [place], [place] [address]  Fungus Culture With Stain     Status: None   Collection Time: 09/06/22  1:40 PM   Specimen: Synovial, Right Shoulder; Body Fluid  Result Value Ref Range Status   Fungus Stain Final report  Final   Fungus (Mycology) Culture Final report  Final    Comment: (NOTE) Performed At: [facility] [address] [place], [place] [id number] Jolene Schimke MD [id number]    Fungal Source TISSUE  Final    Comment: Performed at [facility], [address] [place], [place] [address]  Aerobic/Anaerobic Culture w Gram Stain (surgical/deep wound)     Status: None   Collection Time: 09/06/22  1:40 PM   Specimen: Synovial, Right Shoulder; Body Fluid  Result Value Ref Range Status   Specimen Description   Final    TISSUE Performed at [facility], [address], [place], [place] [address]    Special Requests R SHOULDER HOLD 21 DAYS  Final   Gram Stain NO WBC SEEN NO ORGANISMS SEEN   Final   Culture   Final    NO GROWTH 21 DAYS CONTINUING TO HOLD Performed at [facility], [address], [place], [place] [address]  Report Status 09/27/2022 FINAL  Final  Aerobic/Anaerobic Culture w Gram Stain (surgical/deep wound)     Status: None   Collection Time: 09/06/22  1:40 PM   Specimen: Synovial, Right Shoulder; Body Fluid  Result Value Ref Range Status   Specimen Description   Final    TISSUE Performed at [facility], [address] [place], [place] [address]    Special Requests   Final    R SHOULDER Performed at [facility], [address], [place], [place] [address]    Gram Stain NO WBC SEEN NO ORGANISMS SEEN   Final   Culture   Final    RARE PROPIONIBACTERIUM  ACNES Standardized susceptibility testing for this organism is not available. Performed at [facility], [address], [place], [place] [address]    Report Status 09/11/2022 FINAL  Final  Aerobic/Anaerobic Culture w Gram Stain (surgical/deep wound)     Status: None   Collection Time: 09/06/22  1:40 PM   Specimen: Synovial, Right Shoulder; Body Fluid  Result Value Ref Range Status   Specimen Description   Final    TISSUE Performed at [facility], [address] [place], [place] [address]    Special Requests   Final    R SHOULDER Performed at [facility], [address], [place], [place] [address]    Gram Stain NO WBC SEEN NO ORGANISMS SEEN   Final   Culture   Final    RARE PROPIONIBACTERIUM ACNES NO ANAEROBES ISOLATED CRITICAL RESULT CALLED TO, READ BACK BY AND VERIFIED WITH: RN DARCY.P AT 0913 ON 09/25/2022 BY T.SAAD Performed at [facility], [address] [place], [place] [address]    Report Status 09/25/2022 FINAL  Final  Fungus Culture Result     Status: None   Collection Time: 09/06/22  1:40 PM  Result Value Ref Range Status   Result 1 Comment  Final    Comment: (NOTE) KOH/Calcofluor preparation:  no fungus observed. Performed At: [facility] [address] [place], [place] [id number] Jolene Schimke MD [id number]   Fungus Culture Result     Status: None   Collection Time: 09/06/22  1:40 PM  Result Value Ref Range Status   Result 1 Comment  Final    Comment: (NOTE) KOH/Calcofluor preparation:  no fungus observed. Performed At: [facility] [address] [place], [place] [id number] Jolene Schimke MD [id number]   Fungus Culture Result     Status: None   Collection Time: 09/06/22  1:40 PM  Result Value Ref Range Status   Result 1 Comment  Final    Comment: (NOTE) KOH/Calcofluor preparation:  no fungus observed. Performed At: [facility] [address] [place], [place]  [id number] Jolene Schimke MD [id number]   Fungal organism reflex     Status: None   Collection Time: 09/06/22  1:40 PM  Result Value Ref Range Status   Fungal result 1 Comment  Final    Comment: (NOTE) No yeast or mold isolated after 4 weeks. Performed At: [facility] [address] [place], [place] [id number] Jolene Schimke MD [id number]   Fungal organism reflex     Status: None   Collection Time: 09/06/22  1:40 PM  Result Value Ref Range Status   Fungal result 1 Comment  Final    Comment: (NOTE) No yeast or mold isolated after 4 weeks. Performed At: [facility] [address] [place], [place] [id number] Jolene Schimke MD  [id number]   Fungal organism reflex     Status: None   Collection Time: 09/06/22  1:40 PM  Result Value Ref Range Status   Fungal result 1 Comment  Final    Comment: (NOTE) No yeast or mold isolated after 4 weeks. Performed At: [facility] [address] [place], [place] [id number] Jolene Schimke MD [id number]    Imaging  Assessment/Plan 30 Y O male with PMH as below including legal blindness, hypertension, hyperlipidemia, diabetes mellitus, gout, peripheral neuropathy,  gait instability with recurrent falls, Bilateral shoulder arthroplasties (  right shoulder reverse shoulder arthroplasty done with [employer] implants about four years ago in [place], left reverse total shoulder arthroplasty done by Dr. Beverely Low that has done well for about the last 10 years) with   # Rt shoulder PJI  - s/p rt revision reverse shoulder arthroplasty, open synovectomy and open treatment of shoulder dislocation. 2/3 Cx Proprionibacterium acnes, Fungal stain negative, fungal cx  no growth  - Completed 6 weeks of IV abtx on 11/24/22 ( linezolid prior to that) after which started on amoxicillin 500mg  po tid from 7/13, plan for 3-6 months   Plan - Continue PO amoxicillin 500mg  po tid until mid Jan ( correction from prior note  )which will be 6 months of PO suppression - Fu in 1 month for reevaluation, may consider stopping abtx  # Medication monitoring  - CRP trending down to 14.1 but still mildly elevated  - labs today    # Low back pain w radiculopathy/Lumbar spinal stenosis - 10/1 MRI L spine findings noted - Seen by Dr Christell Constant 11/7 and recommended conservative management prior to surgical decompression   I have personally spent 30 minutes involved in face-to-face and non-face-to-face activities for this patient on the day of the visit. Professional time spent includes the following activities: Preparing to see the patient (review of tests), Obtaining and/or reviewing separately obtained history (admission/discharge record), Performing a medically appropriate examination and/or evaluation , Ordering medications/tests/procedures, referring and communicating with other health care professionals, Documenting clinical information in the EMR, Independently interpreting results (not separately reported), Communicating results to the patient/family/caregiver, Counseling and educating the patient/family/caregiver and Care coordination (not separately reported).   Victoriano Lain, MD [facility] 04/24/2023, 2:57 PM

## 2023-04-25 ENCOUNTER — Other Ambulatory Visit: Payer: Self-pay

## 2023-04-25 ENCOUNTER — Ambulatory Visit (HOSPITAL_COMMUNITY)
Admission: RE | Admit: 2023-04-25 | Discharge: 2023-04-25 | Disposition: A | Discharge status: Home / Self Care | Payer: Medicare Other | Attending: General Surgery | Admitting: General Surgery

## 2023-04-25 ENCOUNTER — Ambulatory Visit (HOSPITAL_COMMUNITY): Payer: Self-pay | Admitting: Anesthesiology

## 2023-04-25 ENCOUNTER — Encounter (HOSPITAL_COMMUNITY): Payer: Self-pay | Admitting: General Surgery

## 2023-04-25 ENCOUNTER — Encounter (HOSPITAL_COMMUNITY)
Admission: RE | Disposition: A | Discharge status: Home / Self Care | Payer: Self-pay | Source: Home / Self Care | Attending: General Surgery

## 2023-04-25 DIAGNOSIS — Z79899 Other long term (current) drug therapy: Secondary | ICD-10-CM | POA: Insufficient documentation

## 2023-04-25 DIAGNOSIS — Z7984 Long term (current) use of oral hypoglycemic drugs: Secondary | ICD-10-CM | POA: Diagnosis not present

## 2023-04-25 DIAGNOSIS — D170 Benign lipomatous neoplasm of skin and subcutaneous tissue of head, face and neck: Secondary | ICD-10-CM | POA: Diagnosis not present

## 2023-04-25 DIAGNOSIS — G4730 Sleep apnea, unspecified: Secondary | ICD-10-CM | POA: Insufficient documentation

## 2023-04-25 DIAGNOSIS — I10 Essential (primary) hypertension: Secondary | ICD-10-CM | POA: Diagnosis not present

## 2023-04-25 DIAGNOSIS — E119 Type 2 diabetes mellitus without complications: Secondary | ICD-10-CM | POA: Insufficient documentation

## 2023-04-25 DIAGNOSIS — Z87891 Personal history of nicotine dependence: Secondary | ICD-10-CM | POA: Insufficient documentation

## 2023-04-25 DIAGNOSIS — R221 Localized swelling, mass and lump, neck: Secondary | ICD-10-CM | POA: Insufficient documentation

## 2023-04-25 HISTORY — DX: Cerebral infarction, unspecified: I63.9

## 2023-04-25 HISTORY — DX: Sleep apnea, unspecified: G47.30

## 2023-04-25 HISTORY — PX: LIPOMA EXCISION: SHX5283

## 2023-04-25 LAB — CBC
HCT: 45.2 % (ref 38.5–50.0)
Hemoglobin: 15.2 g/dL (ref 13.2–17.1)
MCH: 29.6 pg (ref 27.0–33.0)
MCHC: 33.6 g/dL (ref 32.0–36.0)
MCV: 88.1 fL (ref 80.0–100.0)
MPV: 10.1 fL (ref 7.5–12.5)
Platelets: 303 10*3/uL (ref 140–400)
RBC: 5.13 10*6/uL (ref 4.20–5.80)
RDW: 12.6 % (ref 11.0–15.0)
WBC: 12.1 10*3/uL — ABNORMAL HIGH (ref 3.8–10.8)

## 2023-04-25 LAB — BASIC METABOLIC PANEL
BUN: 9 mg/dL (ref 7–25)
CO2: 28 mmol/L (ref 20–32)
Calcium: 9.8 mg/dL (ref 8.6–10.3)
Chloride: 99 mmol/L (ref 98–110)
Creat: 0.87 mg/dL (ref 0.70–1.22)
Glucose, Bld: 106 mg/dL — ABNORMAL HIGH (ref 65–99)
Potassium: 4.9 mmol/L (ref 3.5–5.3)
Sodium: 137 mmol/L (ref 135–146)

## 2023-04-25 LAB — C-REACTIVE PROTEIN: CRP: 9.4 mg/L — ABNORMAL HIGH (ref <8.0)

## 2023-04-25 LAB — GLUCOSE, CAPILLARY
Glucose-Capillary: 95 mg/dL (ref 70–99)
Glucose-Capillary: 134 mg/dL — ABNORMAL HIGH (ref 70–99)

## 2023-04-25 SURGERY — EXCISION LIPOMA
Anesthesia: General | Site: Neck | Laterality: Right

## 2023-04-25 MED ORDER — SUGAMMADEX SODIUM 200 MG/2ML IV SOLN
INTRAVENOUS | Status: DC | PRN
  Administered 2023-04-25 (×2): 200 mg via INTRAVENOUS

## 2023-04-25 MED ORDER — CHLORHEXIDINE GLUCONATE CLOTH 2 % EX PADS: 6.0000 | MEDICATED_PAD | Freq: Once | CUTANEOUS | Status: DC

## 2023-04-25 MED ORDER — TRAMADOL HCL 50 MG PO TABS: 50.0000 mg | ORAL_TABLET | Freq: Four times a day (QID) | ORAL | 0 refills | Status: DC | PRN

## 2023-04-25 MED ORDER — ONDANSETRON HCL 4 MG/2ML IJ SOLN: 4.0000 mg | Freq: Once | INTRAMUSCULAR | Status: DC | PRN

## 2023-04-25 MED ORDER — PROPOFOL 10 MG/ML IV BOLUS
INTRAVENOUS | Status: DC | PRN
  Administered 2023-04-25: 130 mg via INTRAVENOUS

## 2023-04-25 MED ORDER — ROCURONIUM BROMIDE 10 MG/ML (PF) SYRINGE
PREFILLED_SYRINGE | INTRAVENOUS | Status: DC | PRN
  Administered 2023-04-25: 10 mg via INTRAVENOUS
  Administered 2023-04-25: 60 mg via INTRAVENOUS

## 2023-04-25 MED ORDER — FENTANYL CITRATE (PF) 250 MCG/5ML IJ SOLN
INTRAMUSCULAR | Status: DC | PRN
  Administered 2023-04-25: 100 ug via INTRAVENOUS

## 2023-04-25 MED ORDER — SODIUM CHLORIDE 0.9 % IV SOLN: INTRAVENOUS | Status: DC | PRN

## 2023-04-25 MED ORDER — CHLORHEXIDINE GLUCONATE 0.12 % MT SOLN
15.0000 mL | Freq: Once | OROMUCOSAL | Status: AC
  Administered 2023-04-25: 15 mL via OROMUCOSAL
  Filled 2023-04-25: qty 15

## 2023-04-25 MED ORDER — DEXAMETHASONE SODIUM PHOSPHATE 10 MG/ML IJ SOLN
INTRAMUSCULAR | Status: DC | PRN
  Administered 2023-04-25: 4 mg via INTRAVENOUS

## 2023-04-25 MED ORDER — HYDROCODONE-ACETAMINOPHEN 5-325 MG PO TABS: 1.0000 | ORAL_TABLET | ORAL | Status: DC | PRN

## 2023-04-25 MED ORDER — EPHEDRINE 5 MG/ML INJ
INTRAVENOUS | Status: AC
  Filled 2023-04-25: qty 5

## 2023-04-25 MED ORDER — FENTANYL CITRATE (PF) 100 MCG/2ML IJ SOLN: 25.0000 ug | INTRAMUSCULAR | Status: DC | PRN

## 2023-04-25 MED ORDER — ONDANSETRON HCL 4 MG/2ML IJ SOLN
INTRAMUSCULAR | Status: AC
  Filled 2023-04-25: qty 2

## 2023-04-25 MED ORDER — HEPARIN SODIUM (PORCINE) 5000 UNIT/ML IJ SOLN
5000.0000 [IU] | Freq: Once | INTRAMUSCULAR | Status: AC
  Administered 2023-04-25: 5000 [IU] via SUBCUTANEOUS
  Filled 2023-04-25: qty 1

## 2023-04-25 MED ORDER — LIDOCAINE 2% (20 MG/ML) 5 ML SYRINGE
INTRAMUSCULAR | Status: DC | PRN
  Administered 2023-04-25: 80 mg via INTRAVENOUS

## 2023-04-25 MED ORDER — BUPIVACAINE-EPINEPHRINE (PF) 0.25% -1:200000 IJ SOLN
INTRAMUSCULAR | Status: AC
  Filled 2023-04-25: qty 30

## 2023-04-25 MED ORDER — 0.9 % SODIUM CHLORIDE (POUR BTL) OPTIME
TOPICAL | Status: DC | PRN
  Administered 2023-04-25: 1000 mL

## 2023-04-25 MED ORDER — PHENYLEPHRINE 80 MCG/ML (10ML) SYRINGE FOR IV PUSH (FOR BLOOD PRESSURE SUPPORT)
PREFILLED_SYRINGE | INTRAVENOUS | Status: AC
  Filled 2023-04-25: qty 10

## 2023-04-25 MED ORDER — LACTATED RINGERS IV SOLN: INTRAVENOUS | Status: DC

## 2023-04-25 MED ORDER — ONDANSETRON HCL 4 MG/2ML IJ SOLN
INTRAMUSCULAR | Status: DC | PRN
  Administered 2023-04-25: 4 mg via INTRAVENOUS

## 2023-04-25 MED ORDER — DEXAMETHASONE SODIUM PHOSPHATE 10 MG/ML IJ SOLN
INTRAMUSCULAR | Status: AC
  Filled 2023-04-25: qty 1

## 2023-04-25 MED ORDER — ORAL CARE MOUTH RINSE: 15.0000 mL | Freq: Once | OROMUCOSAL | Status: AC

## 2023-04-25 MED ORDER — FENTANYL CITRATE (PF) 250 MCG/5ML IJ SOLN
INTRAMUSCULAR | Status: AC
  Filled 2023-04-25: qty 5

## 2023-04-25 MED ORDER — LIDOCAINE 2% (20 MG/ML) 5 ML SYRINGE
INTRAMUSCULAR | Status: AC
  Filled 2023-04-25: qty 5

## 2023-04-25 MED ORDER — PROPOFOL 10 MG/ML IV BOLUS
INTRAVENOUS | Status: AC
  Filled 2023-04-25: qty 20

## 2023-04-25 MED ORDER — BUPIVACAINE-EPINEPHRINE (PF) 0.25% -1:200000 IJ SOLN
INTRAMUSCULAR | Status: DC | PRN
  Administered 2023-04-25: 20 mL

## 2023-04-25 MED ORDER — ROCURONIUM BROMIDE 10 MG/ML (PF) SYRINGE
PREFILLED_SYRINGE | INTRAVENOUS | Status: AC
  Filled 2023-04-25: qty 10

## 2023-04-25 MED ORDER — EPHEDRINE SULFATE-NACL 50-0.9 MG/10ML-% IV SOSY
PREFILLED_SYRINGE | INTRAVENOUS | Status: DC | PRN
  Administered 2023-04-25 (×2): 10 mg via INTRAVENOUS
  Administered 2023-04-25: 5 mg via INTRAVENOUS

## 2023-04-25 MED ORDER — CEFAZOLIN SODIUM-DEXTROSE 2-4 GM/100ML-% IV SOLN
2.0000 g | INTRAVENOUS | Status: AC
  Administered 2023-04-25: 2 g via INTRAVENOUS
  Filled 2023-04-25: qty 100

## 2023-04-25 SURGICAL SUPPLY — 32 items
CANISTER SUCT 3000ML PPV (MISCELLANEOUS) ×1 IMPLANT
CHLORAPREP W/TINT 26 (MISCELLANEOUS) ×1 IMPLANT
COVER SURGICAL LIGHT HANDLE (MISCELLANEOUS) ×1 IMPLANT
DERMABOND ADVANCED .7 DNX12 (GAUZE/BANDAGES/DRESSINGS) ×1 IMPLANT
DRAPE LAPAROSCOPIC ABDOMINAL (DRAPES) IMPLANT
DRAPE LAPAROTOMY 100X72 PEDS (DRAPES) IMPLANT
DRSG TEGADERM 4X4.75 (GAUZE/BANDAGES/DRESSINGS) IMPLANT
ELECT REM PT RETURN 9FT ADLT (ELECTROSURGICAL) ×1 IMPLANT
ELECTRODE REM PT RTRN 9FT ADLT (ELECTROSURGICAL) ×1 IMPLANT
GAUZE 4X4 16PLY ~~LOC~~+RFID DBL (SPONGE) IMPLANT
GAUZE SPONGE 4X4 12PLY STRL (GAUZE/BANDAGES/DRESSINGS) IMPLANT
GLOVE BIO SURGEON STRL SZ 6 (GLOVE) ×1 IMPLANT
GLOVE INDICATOR 6.5 STRL GRN (GLOVE) ×1 IMPLANT
GOWN STRL REUS W/ TWL LRG LVL3 (GOWN DISPOSABLE) ×1 IMPLANT
KIT BASIN OR (CUSTOM PROCEDURE TRAY) ×1 IMPLANT
KIT TURNOVER KIT B (KITS) ×1 IMPLANT
MARKER SKIN DUAL TIP RULER LAB (MISCELLANEOUS) ×1 IMPLANT
NDL HYPO 25GX1X1/2 BEV (NEEDLE) ×1 IMPLANT
NEEDLE HYPO 25GX1X1/2 BEV (NEEDLE) ×1 IMPLANT
NS IRRIG 1000ML POUR BTL (IV SOLUTION) ×1 IMPLANT
PACK GENERAL/GYN (CUSTOM PROCEDURE TRAY) ×1 IMPLANT
PAD ARMBOARD 7.5X6 YLW CONV (MISCELLANEOUS) ×2 IMPLANT
SPECIMEN JAR SMALL (MISCELLANEOUS) ×1 IMPLANT
SPONGE T-LAP 4X18 ~~LOC~~+RFID (SPONGE) IMPLANT
STRIP CLOSURE SKIN 1/2X4 (GAUZE/BANDAGES/DRESSINGS) IMPLANT
SUT MON AB 4-0 PC3 18 (SUTURE) ×1 IMPLANT
SUT SILK 2 0 PERMA HAND 18 BK (SUTURE) IMPLANT
SUT VIC AB 3-0 SH 18 (SUTURE) IMPLANT
SUT VIC AB 3-0 SH 27X BRD (SUTURE) ×1 IMPLANT
SYR CONTROL 10ML LL (SYRINGE) ×1 IMPLANT
TOWEL GREEN STERILE (TOWEL DISPOSABLE) ×1 IMPLANT
TOWEL GREEN STERILE FF (TOWEL DISPOSABLE) ×1 IMPLANT

## 2023-04-25 NOTE — Op Note (Signed)
EXCISION LIPOMA POSTERIOR RIGHT NECK  Operative Note (CSN: [id number])  Service  Date of Surgery: 04/25/2023 Admit Date: 04/25/2023 Performing Service: General Surgeons and Role:    Lysle Rubens, MD - Primary  Op Note Pre-op Diagnosis: lipoma right posterior neck Post-op Diagnosis: same  Procedure(s): EXCISION LIPOMA POSTERIOR RIGHT NECK  Findings: 5 x 3 cm lipoma  Anesthesia: General Estimated Blood Loss: Minimal  Complications: None Specimens: Neck lipoma  Brief history / Indications for Surgery: Posterior neck lipoma present for several months with some associated discomfort but no signs of infection.  Procedure Details  Prior to the procedure, the risks, benefits, complications, treatment options, and expected outcomes were discussed with the patient and/or family, including but not limited to, the risks of bleeding, infection, and damage to surrounding structures. Despite the risks, the patient has given informed consent for operative intervention.  The patient was taken to the Operating Room, identified as Kenneth FIORENTINO and the procedure verified as excision of posterior neck lipoma.  Identification pause was held and the above information confirmed.  The patient was placed in the left lateral decubitus position and General LMA anesthesia was induced. The neck was prepped with chloraprep and draped in the typical sterile fashion.  A formal preincision time out was performed.  0.25% marcaine with epi was injected overlying the lipoma in the area of planned incision. A 5cm incision following Langerhans lines was made overlying in lipoma. The overlying subcutaneous tissue was dissected using electrocautery. The lipoma was then grasped with Alice clamps and dissected away from surrounding tissue using a combination of blunt dissection and electrocautery, The lipoma was removed and sent for pathology. The wound was then irrigated with sterile saline. Hemostasis achieved. The  wound was then closed in layers using interrupted simple sutures with 3-0 vicryl. There was no large defect on completion so it was decided no drain was necessary. The skin was closed with running subcuticular using 4-0 monocryl and dermabond. Additional local anesthetic was injected into the surgical site and skin for a total of 20cc. A small pressure dressing was placed over the incision after dermabond completely dry.  Instrument, sponge, and needle counts were correct at the conclusion of the case.    Lysle Rubens, MD [facility] Surgery Date: 04/25/2023  Time: 7:54 AM

## 2023-04-25 NOTE — Interval H&P Note (Signed)
History and Physical Interval Note:  04/25/2023 7:44 AM  Kenneth Henson  has presented today for surgery, with the diagnosis of lipoma right posterior neck.  The various methods of treatment have been discussed with the patient and family. After consideration of risks, benefits and other options for treatment, the patient has consented to  Procedure(s) with comments: EXCISION LIPOMA POSTERIOR RIGHT NECK (Right) - LMA as a surgical intervention.  The patient's history has been reviewed, patient examined, no change in status, stable for surgery.  I have reviewed the patient's chart and labs.  Questions were answered to the patient's satisfaction.     Lysle Rubens

## 2023-04-25 NOTE — Transfer of Care (Signed)
Immediate Anesthesia Transfer of Care Note  Patient: Kenneth Henson  Procedure(s) Performed: EXCISION LIPOMA POSTERIOR RIGHT NECK (Right: Neck)  Patient Location: PACU  Anesthesia Type:General  Level of Consciousness: awake, alert , and patient cooperative  Airway & Oxygen Therapy: Patient Spontanous Breathing and Patient connected to face mask oxygen  Post-op Assessment: Report given to RN and Post -op Vital signs reviewed and stable  Post vital signs: Reviewed and stable  Last Vitals:  Vitals Value Taken Time  BP 156/107 04/25/23 0947  Temp    Pulse 77 04/25/23 0949  Resp 33 04/25/23 0949  SpO2 96 % 04/25/23 0949  Vitals shown include unfiled device data.  Last Pain:  Vitals:   04/25/23 0648  TempSrc: Oral  PainSc:       Patients Stated Pain Goal: 0 (04/25/23 0641)  Complications: No notable events documented.

## 2023-04-25 NOTE — Addendum Note (Signed)
Addendum  created 04/25/23 1035 by Darlina Guys, CRNA   Clinical Note Signed, Intraprocedure Blocks edited, SmartForm saved

## 2023-04-25 NOTE — Anesthesia Postprocedure Evaluation (Signed)
Anesthesia Post Note  Patient: Kenneth Henson  Procedure(s) Performed: EXCISION LIPOMA POSTERIOR RIGHT NECK (Right: Neck)     Patient location during evaluation: PACU Anesthesia Type: General Level of consciousness: awake and alert and oriented Pain management: pain level controlled Vital Signs Assessment: post-procedure vital signs reviewed and stable Respiratory status: spontaneous breathing, nonlabored ventilation and respiratory function stable Cardiovascular status: blood pressure returned to baseline and stable Postop Assessment: no apparent nausea or vomiting Anesthetic complications: no   No notable events documented.  Last Vitals:  Vitals:   04/25/23 1000 04/25/23 1015  BP: (!) 167/74 (!) 168/74  Pulse: 73 70  Resp: (!) 26 16  Temp:    SpO2: 91% 96%    Last Pain:  Vitals:   04/25/23 0945  TempSrc:   PainSc: 0-No pain                 Laportia Carley A.

## 2023-04-25 NOTE — Anesthesia Procedure Notes (Addendum)
Procedure Name: Intubation Date/Time: 04/25/2023 8:40 AM  Performed by: Darlina Guys, CRNAPre-anesthesia Checklist: Patient identified, Emergency Drugs available, Suction available and Patient being monitored Patient Re-evaluated:Patient Re-evaluated prior to induction Oxygen Delivery Method: Circle System Utilized Preoxygenation: Pre-oxygenation with 100% oxygen Induction Type: IV induction Ventilation: Oral airway inserted - appropriate to patient size Laryngoscope Size: Mac and 4 Grade View: Grade I Tube type: Oral Tube size: 7.5 mm Number of attempts: 1 Airway Equipment and Method: Stylet and Oral airway Placement Confirmation: ETT inserted through vocal cords under direct vision, positive ETCO2 and breath sounds checked- equal and bilateral Secured at: 22 cm Tube secured with: Tape Dental Injury: Teeth and Oropharynx as per pre-operative assessment  Comments: Atraumatic induction/intubation.

## 2023-04-26 ENCOUNTER — Ambulatory Visit: Payer: Medicare Other | Admitting: Orthopedic Surgery

## 2023-04-26 ENCOUNTER — Encounter (HOSPITAL_COMMUNITY): Payer: Self-pay | Admitting: General Surgery

## 2023-04-26 DIAGNOSIS — M48062 Spinal stenosis, lumbar region with neurogenic claudication: Secondary | ICD-10-CM | POA: Diagnosis not present

## 2023-04-26 LAB — SURGICAL PATHOLOGY

## 2023-04-26 NOTE — Progress Notes (Signed)
Orthopedic Spine Surgery Office Note   Assessment: Patient is a 82 y.o. male with low back pain that radiates into the bilateral buttock and right lateral thigh.  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  I went over the risks and benefits of this procedure. He wanted to think about it some and bring in his daughter at our next visit to talk about it further before making a decision -Patient should return to office in 4 weeks, x-rays at next visit: none     Patient expressed understanding of the plan and all questions were answered to the patient's satisfaction.    ___________________________________________________________________________     History:   Patient is a 82 y.o. male who presents today for follow-up on his lumbar spine.  Patient has had over a year of low back pain that radiates into his bilateral buttocks and sometimes into the right lateral thigh.  He feels the pain when he is standing or walking.  He says that after approximately 2 minutes of standing or walking he has significant pain and has to sit down.  His pain resolves as soon as he sits down.  He has tried multiple conservative treatments but has not found any of them to provide him with satisfactory pain relief.  He has not developed any new symptoms since he was last seen.   Treatments tried: Tylenol, ibuprofen, gabapentin, steroid injection     Physical Exam:   General: no acute distress, appears stated age Neurologic: alert, answering questions appropriately, following commands Respiratory: unlabored breathing on room air, symmetric chest rise Psychiatric: appropriate affect, normal cadence to speech     MSK (spine):   -Strength exam                                                    Left                  Right EHL                              5/5                  5/5 TA                                 5/5                  5/5 GSC                             5/5                  5/5 Knee extension            5/5                  5/5 Hip flexion                    5/5  5/5   -Sensory exam                           Sensation intact to light touch in L3-S1 nerve distributions of bilateral lower extremities   -Achilles DTR: 1/4 on the left, 1/4 on the right -Patellar tendon DTR: 2/4 on the left, 2/4 on the right   -Straight leg raise: negative bilaterally -Femoral nerve stretch test: negative bilaterally -Clonus: no beats bilaterally -Palpable DP pulses bilaterally, feet warm and well perfused   -Left hip exam: no pain through range of motion, negative stinchfield, negative faber, negative SI joint compression test -Right hip exam: no pain through range of motion, negative stinchfield, negative faber, negative SI joint compression test   Imaging: XRs of the lumbar spine from 03/22/2023 were previously independently reviewed and interpreted, showing syndesmophytes seen at T11/T12 and T12/L1. Disc height loss at L3/4 and L4/5. No fracture or dislocation seen. No evidence of instability on flexion/extension views.    MRI of the lumbar spine from 02/13/2023 was previously independently reviewed and interpreted, showing bilateral foraminal stenosis at L3/4 and L4/5. Central and lateral recess stenosis at L4/5.      Patient name: Kenneth Henson Patient MRN: [id number] Date of visit: 04/26/23

## 2023-05-02 ENCOUNTER — Ambulatory Visit (HOSPITAL_BASED_OUTPATIENT_CLINIC_OR_DEPARTMENT_OTHER): Payer: Medicare Other

## 2023-05-02 ENCOUNTER — Ambulatory Visit (HOSPITAL_BASED_OUTPATIENT_CLINIC_OR_DEPARTMENT_OTHER): Payer: Medicare Other | Admitting: Family Medicine

## 2023-05-02 ENCOUNTER — Ambulatory Visit (HOSPITAL_BASED_OUTPATIENT_CLINIC_OR_DEPARTMENT_OTHER): Payer: Medicare Other | Admitting: Orthopaedic Surgery

## 2023-05-02 DIAGNOSIS — G8929 Other chronic pain: Secondary | ICD-10-CM | POA: Diagnosis not present

## 2023-05-02 DIAGNOSIS — M25561 Pain in right knee: Secondary | ICD-10-CM | POA: Diagnosis not present

## 2023-05-02 MED ORDER — TRIAMCINOLONE ACETONIDE 40 MG/ML IJ SUSP
80.0000 mg | INTRAMUSCULAR | Status: AC | PRN
  Administered 2023-05-02: 80 mg via INTRA_ARTICULAR

## 2023-05-02 MED ORDER — LIDOCAINE HCL 1 % IJ SOLN
4.0000 mL | INTRAMUSCULAR | Status: AC | PRN
  Administered 2023-05-02: 4 mL

## 2023-05-02 NOTE — Progress Notes (Signed)
Chief Complaint: Right knee pain     History of Present Illness:   05/02/2023: Presents today for right knee pain.  He is having persistent medial based knee pain.  Kenneth Henson is a 82 y.o. male presents today with radiating ongoing lower back pain down the posterior aspect of the right thigh.  He has previously seen by PA Hazle Nordmann for this.  He was started on an anti-inflammatory without any relief.  He has initially been doing physical therapy exercises without any relief.  He is here today for further discussion and follow-up.    Surgical History:   None  PMH/PSH/Family History/Social History/Meds/Allergies:    Past Medical History:  Diagnosis Date  . Abnormal involuntary movements(781.0)   . Arthritis   . Chicken pox   . Claudication (HCC)   . Colon polyps   . Diabetes mellitus type II   . Erectile dysfunction   . Hyperlipidemia   . Hypertension   . Neoplasm of skin    uncertain behavior  . Sleep apnea   . Stroke [facility])    Past Surgical History:  Procedure Laterality Date  . CARPAL TUNNEL RELEASE     right  . HAND SURGERY    . KNEE SURGERY    . LIPOMA EXCISION Right 04/25/2023   Procedure: EXCISION LIPOMA POSTERIOR RIGHT NECK;  Surgeon: Lysle Rubens, MD;  Location: [facility] OR;  Service: General;  Laterality: Right;  LMA  . MINOR HARDWARE REMOVAL Right 09/06/2022   Procedure: MINOR HARDWARE REMOVAL;  Surgeon: Bjorn Pippin, MD;  Location: [facility] ORS;  Service: Orthopedics;  Laterality: Right;  . NECK SURGERY    . ORIF HUMERUS FRACTURE Right 09/06/2022   Procedure: OPEN REDUCTION INTERNAL FIXATION (ORIF) PROXIMAL HUMERUS FRACTURE;  Surgeon: Bjorn Pippin, MD;  Location: [facility] ORS;  Service: Orthopedics;  Laterality: Right;  . SHOULDER SURGERY Right    2020  . SHOULDER SURGERY Left    2011  . TOTAL SHOULDER REVISION Right 09/06/2022   Procedure: TOTAL SHOULDER REVISION;  Surgeon: Bjorn Pippin, MD;  Location: [facility] ORS;  Service:  Orthopedics;  Laterality: Right;   Social History   Socioeconomic History  . Marital status: Single    Spouse name: Not on file  . Number of children: Not on file  . Years of education: Not on file  . Highest education level: Not on file  Occupational History  . Not on file  Tobacco Use  . Smoking status: Former    Current packs/day: 0.00    Types: Cigarettes    Quit date: 05/15/2009    Years since quitting: 13.9    Passive exposure: Past  . Smokeless tobacco: Never  Vaping Use  . Vaping status: Never Used  Substance and Sexual Activity  . Alcohol use: Yes    Alcohol/week: 1.0 standard drink of alcohol    Types: 1 Shots of liquor per week    Comment: vodka, weekly  . Drug use: No  . Sexual activity: Not on file  Other Topics Concern  . Not on file  Social History Narrative  . Not on file   Social Drivers of Health   Financial Resource Strain: Low Risk  (06/20/2022)   Overall Financial Resource Strain (CARDIA)   . Difficulty of Paying Living Expenses: Not hard at all  Food Insecurity: No Food Insecurity (09/08/2022)   Hunger Vital Sign   . Worried About Programme researcher, broadcasting/film/video in the Last Year: Never true   . Ran Out of Food in the Last Year: Never true  Transportation Needs: No Transportation Needs (09/08/2022)   PRAPARE - Transportation   . Lack of Transportation (Medical): No   . Lack of Transportation (Non-Medical): No  Physical Activity: Inactive (06/20/2022)   Exercise Vital Sign   . Days of Exercise per Week: 0 days   . Minutes of Exercise per Session: 0 min  Stress: No Stress Concern Present (06/20/2022)   [employer] of Occupational Health - Occupational Stress Questionnaire   . Feeling of Stress : Not at all  Social Connections: Moderately Isolated (02/08/2023)   Social Connection and Isolation Panel [NHANES]   . Frequency of Communication with Friends and Family: More than three times a week   . Frequency of Social Gatherings with Friends and Family: More  than three times a week   . Attends Religious Services: Never   . Active Member of Clubs or Organizations: Yes   . Attends Banker Meetings: More than 4 times per year   . Marital Status: Divorced   Family History  Problem Relation Age of Onset  . Hypertension Mother   . Heart disease Mother   . Stroke Father   . Colon cancer Neg Hx   . Stomach cancer Neg Hx   . Breast cancer Sister   . Colon polyps Sister    Allergies  Allergen Reactions  . Doxycycline Nausea And Vomiting  . Oxycodone-Acetaminophen Itching   Current Outpatient Medications  Medication Sig Dispense Refill  . allopurinol (ZYLOPRIM) 300 MG tablet Take 300 mg by mouth every 12 (twelve) hours as needed (gout).    Marland Kitchen amoxicillin (AMOXIL) 500 MG tablet Take 1 tablet (500 mg total) by mouth 3 (three) times daily. (Patient taking differently: Take 500 mg by mouth 3 (three) times daily. Post shoulder surgery) 90 tablet 5  . aspirin EC 81 MG tablet Take 81 mg by mouth daily. Swallow whole.    . diphenhydrAMINE HCl, Sleep, (ZZZQUIL) 50 MG/30ML LIQD Take 50 mg by mouth daily as needed (sleep).    . gabapentin (NEURONTIN) 300 MG capsule Take 1 tab before bed x 2 weeks.  May increase to 1 tab at dinner for 2 weeks and then may increase to am, dinner and before bed thereafter for pain. (Patient taking differently: Take 300 mg by mouth daily.) 90 capsule 3  . ibuprofen (ADVIL) 200 MG tablet Take 400 mg by mouth every 6 (six) hours as needed for moderate pain (pain score 4-6) or mild pain (pain score 1-3).    Marland Kitchen lisinopril (PRINIVIL,ZESTRIL) 40 MG tablet Take 1 tablet (40 mg total) by mouth daily. 90 tablet 3  . metFORMIN (GLUCOPHAGE) 850 MG tablet Take 850 mg by mouth daily. Takes it once in the morning, patient choice    . Multiple Vitamins-Minerals (MULTIVITAMIN WITH MINERALS) tablet Take 1 tablet by mouth daily. centrum    . simvastatin (ZOCOR) 20 MG tablet Take 20 mg by mouth at bedtime.    . traMADol (ULTRAM) 50 MG  tablet Take 1 tablet (50 mg total) by mouth every 6 (six) hours as needed. 10 tablet 0   No current facility-administered medications for this visit.   No results found.  Review of Systems:   A ROS was performed including pertinent positives and negatives as documented in the HPI.  Physical Exam :   Constitutional: NAD and appears stated age Neurological: Alert and oriented Psych: Appropriate affect and cooperative There were no vitals taken for this visit.   Comprehensive Musculoskeletal Exam:    Positive straight leg raise on the right with radicular type symptoms down the right leg in addition of right back pain  Right knee with tenderness about the medial joint.  There is significant crepitus.  Imaging:    MRI lumbar spine: Significant multilevel degenerative disc disease in the lumbar spine with evidence of lumbar spinal stenosis  X-ray 4 views right knee: He has medial compartment significant osteoarthritis  I personally reviewed and interpreted the radiographs.   Assessment:   82 y.o. 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  At this time I recommend ultrasound-guided injection of the right knee.  He would like to proceed with this.  Plan :    -Right knee ultrasound-guided injection performed with verbal consent obtained    Procedure Note  Patient: Kenneth Henson             Date of Birth: 1941/04/12           MRN: [id number]             Visit Date: 05/02/2023  Procedures: Visit Diagnoses:  1. Chronic pain of right knee     Large Joint Inj: R knee on 05/02/2023 5:31 PM Indications: pain Details: 22 G 1.5 in needle, ultrasound-guided anterior approach  Arthrogram: No  Medications: 4 mL lidocaine 1 %; 80 mg triamcinolone acetonide 40 MG/ML Outcome: tolerated well, no immediate complications Procedure, treatment alternatives, risks and benefits explained, specific risks discussed. Consent was given by the patient.  Immediately prior to procedure a time out was called to verify the correct patient, procedure, equipment, support staff and site/side marked as required. Patient was prepped and draped in the usual sterile fashion.          I personally saw and evaluated the patient, and participated in the management and treatment plan.  Huel Cote, MD Attending Physician, Orthopedic Surgery  This document was dictated using Dragon voice recognition software. A reasonable attempt at proof reading has been made to minimize errors.

## 2023-05-17 ENCOUNTER — Ambulatory Visit: Payer: Medicare Other | Admitting: Orthopedic Surgery

## 2023-05-24 ENCOUNTER — Encounter (HOSPITAL_BASED_OUTPATIENT_CLINIC_OR_DEPARTMENT_OTHER): Payer: Self-pay | Admitting: Family Medicine

## 2023-05-24 ENCOUNTER — Ambulatory Visit (INDEPENDENT_AMBULATORY_CARE_PROVIDER_SITE_OTHER): Payer: Medicare Other | Admitting: Family Medicine

## 2023-05-24 VITALS — BP 136/78 | HR 76 | Ht 68.0 in | Wt 261.5 lb

## 2023-05-24 DIAGNOSIS — F339 Major depressive disorder, recurrent, unspecified: Secondary | ICD-10-CM | POA: Insufficient documentation

## 2023-05-24 DIAGNOSIS — E1142 Type 2 diabetes mellitus with diabetic polyneuropathy: Secondary | ICD-10-CM

## 2023-05-24 DIAGNOSIS — Z658 Other specified problems related to psychosocial circumstances: Secondary | ICD-10-CM | POA: Insufficient documentation

## 2023-05-24 NOTE — Patient Instructions (Signed)
  Medication Instructions:  Your physician recommends that you continue on your current medications as directed. Please refer to the Current Medication list given to you today. --If you need a refill on any your medications before your next appointment, please call   Follow-Up: Your next appointment:   Your physician recommends that you schedule a follow-up appointment in: 6 week follow up  with Dr. de [place]  You will receive a text message or e-mail with a link to a survey about your care and experience with us  today! We would greatly appreciate your feedback!   Thanks for letting us  be apart of your health journey!!  Primary Care and Sports Medicine   Dr. Quintin sheerer Cuba   We encourage you to activate your patient portal called MyChart.  Sign up information is provided on this After Visit Summary.  MyChart is used to connect with patients for Virtual Visits (Telemedicine).  Patients are able to view lab/test results, encounter notes, upcoming appointments, etc.  Non-urgent messages can be sent to your provider as well. To learn more about what you can do with MyChart, please visit --  [email or web address].

## 2023-05-24 NOTE — Assessment & Plan Note (Signed)
 Patient continues to have pain related to neuropathy.  He continues with gabapentin , at last visit we did discuss going up on dose from 1 capsule nightly to 3 times a day.  He indicates that he is currently taking medication twice daily.  Has not had any side effects with medication, continue to have symptoms and has not noted significant improvement with change in gabapentin  dose.  He did have appointment with podiatry, however does not feel that appointment and evaluation with them was very beneficial. We discussed options today, given that patient has only been taking gabapentin  twice daily, recommend increasing to 3 times daily.  Did discuss that if continuing to have adequate control, we can look to gradually titrate dose as he is on somewhat lower dose of gabapentin .  Additionally, I do feel that working with podiatry would be beneficial for patient.  Given poor experience recently, could consider meeting with alternative provider within office where he had recent evaluation.  Patient to consider and he will reach out to podiatry office if he elects to do so.  Will plan for follow-up in about 6 weeks to monitor progress.

## 2023-05-24 NOTE — Progress Notes (Signed)
    Procedures performed today:    None.  Independent interpretation of notes and tests performed by another provider:   None.  Brief History, Exam, Impression, and Recommendations:    BP 136/78 (BP Location: Right Arm, Patient Position: Sitting, Cuff Size: Normal)   Pulse 76   Ht 5' 8 (1.727 m)   Wt 261 lb 8 oz (118.6 kg)   SpO2 95%   BMI 39.76 kg/m   Type 2 diabetes mellitus with diabetic polyneuropathy, without long-term current use of insulin  [facility]) Assessment & Plan: Patient continues to have pain related to neuropathy.  He continues with gabapentin , at last visit we did discuss going up on dose from 1 capsule nightly to 3 times a day.  He indicates that he is currently taking medication twice daily.  Has not had any side effects with medication, continue to have symptoms and has not noted significant improvement with change in gabapentin  dose.  He did have appointment with podiatry, however does not feel that appointment and evaluation with them was very beneficial. We discussed options today, given that patient has only been taking gabapentin  twice daily, recommend increasing to 3 times daily.  Did discuss that if continuing to have adequate control, we can look to gradually titrate dose as he is on somewhat lower dose of gabapentin .  Additionally, I do feel that working with podiatry would be beneficial for patient.  Given poor experience recently, could consider meeting with alternative provider within office where he had recent evaluation.  Patient to consider and he will reach out to podiatry office if he elects to do so.  Will plan for follow-up in about 6 weeks to monitor progress.   Return in about 6 weeks (around 07/05/2023) for neuropathy.   ___________________________________________ Brittay Mogle de [place], MD, ABFM, CAQSM Primary Care and Sports Medicine [facility]

## 2023-05-29 ENCOUNTER — Ambulatory Visit: Payer: Medicare Other | Admitting: Infectious Diseases

## 2023-05-29 ENCOUNTER — Telehealth (HOSPITAL_BASED_OUTPATIENT_CLINIC_OR_DEPARTMENT_OTHER): Payer: Self-pay | Admitting: Family Medicine

## 2023-05-29 NOTE — Telephone Encounter (Signed)
 ERROR

## 2023-05-31 ENCOUNTER — Ambulatory Visit: Payer: Medicare Other | Admitting: Infectious Diseases

## 2023-05-31 ENCOUNTER — Other Ambulatory Visit: Payer: Self-pay

## 2023-05-31 VITALS — BP 121/82 | HR 84 | Temp 98.0°F | Wt 259.0 lb

## 2023-05-31 DIAGNOSIS — Z79899 Other long term (current) drug therapy: Secondary | ICD-10-CM

## 2023-05-31 DIAGNOSIS — M48061 Spinal stenosis, lumbar region without neurogenic claudication: Secondary | ICD-10-CM

## 2023-05-31 DIAGNOSIS — T8450XD Infection and inflammatory reaction due to unspecified internal joint prosthesis, subsequent encounter: Secondary | ICD-10-CM

## 2023-05-31 NOTE — Progress Notes (Signed)
Patient Active Problem List   Diagnosis Date Noted   Major depressive disorder, recurrent, unspecified (HCC) 05/24/2023   Other specified problems related to psychosocial circumstances 05/24/2023   Spinal stenosis of lumbar region 02/27/2023   Insomnia 02/08/2023   Lipoma of neck 02/08/2023   Skin lesion 02/08/2023   Chronic bilateral low back pain with right-sided sciatica 12/07/2022   Prosthetic joint infection, subsequent encounter 10/19/2022   Medication management 10/04/2022   Status post reverse total arthroplasty of right shoulder 09/06/2022   Acute pain of right shoulder 08/11/2022   Synovial cyst of left popliteal space 08/11/2022   Fracture of left tibial plateau 08/06/2022   Need for influenza vaccination 03/01/2021   Pustular inflammation of skin 12/01/2020   Decreased visual acuity 12/01/2020   Moderately increased albuminuria 12/01/2020   Diabetes mellitus without complication (HCC) 10/27/2020   Vitreomacular adhesion of both eyes 10/27/2020   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 10/27/2020   Exudative age-related macular degeneration of left eye with active choroidal neovascularization (HCC) 10/27/2020   Diabetes (HCC) 09/01/2020   Retinal tear 09/01/2020   Headache 01/16/2012   Atypical nevus 11/07/2011   Trigger finger 05/22/2011   Bronchitis 05/22/2011   Preop cardiovascular exam 04/11/2011   Knee pain 02/24/2011   Bursitis of elbow 10/31/2010   OBESITY 04/28/2010   INCONTINENCE, URGE 04/28/2010   LEG PAIN, BILATERAL 12/08/2009   CHEST PAIN 12/08/2009   ELECTROCARDIOGRAM, ABNORMAL 12/08/2009   NEOPLASM, SKIN, UNCERTAIN BEHAVIOR 12/06/2009   PREMATURE ATRIAL CONTRACTIONS 12/06/2009   History of colonic polyps 11/17/2009   ERECTILE DYSFUNCTION, 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   Hyperlipidemia 09/29/2009   TOBACCO USE 09/29/2009   Essential hypertension 09/29/2009   SHOULDER, PAIN 09/29/2009   HIP  PAIN, LEFT 09/29/2009   Abnormal involuntary movement 09/29/2009   Current Outpatient Medications on File Prior to Visit  Medication Sig Dispense Refill   allopurinol (ZYLOPRIM) 300 MG tablet Take 300 mg by mouth every 12 (twelve) hours as needed (gout).     amoxicillin (AMOXIL) 500 MG tablet Take 1 tablet (500 mg total) by mouth 3 (three) times daily. 90 tablet 5   aspirin EC 81 MG tablet Take 81 mg by mouth daily. Swallow whole.     diphenhydrAMINE HCl, Sleep, (ZZZQUIL) 50 MG/30ML LIQD Take 50 mg by mouth daily as needed (sleep).     gabapentin (NEURONTIN) 300 MG capsule Take 1 tab before bed x 2 weeks.  May increase to 1 tab at dinner for 2 weeks and then may increase to am, dinner and before bed thereafter for pain. (Patient taking differently: Take 300 mg by mouth daily.) 90 capsule 3   ibuprofen (ADVIL) 200 MG tablet Take 400 mg by mouth every 6 (six) hours as needed for moderate pain (pain score 4-6) or mild pain (pain score 1-3).     lisinopril (PRINIVIL,ZESTRIL) 40 MG tablet Take 1 tablet (40 mg total) by mouth daily. 90 tablet 3   metFORMIN (GLUCOPHAGE) 850 MG tablet Take 850 mg by mouth daily. Takes it once in the morning, patient choice     simvastatin (ZOCOR) 20 MG tablet Take 20 mg by mouth at bedtime.     No current facility-administered medications on file prior to visit.    Subjective: 83 Y O male with PMH as below including legal blindness, hypertension, hyperlipidemia, diabetes mellitus, gout, peripheral neuropathy,  gait instability with recurrent falls, Bilateral shoulder arthroplasties (  right shoulder reverse shoulder arthroplasty  done with [employer] implants about four years ago in [place], left reverse total shoulder arthroplasty done by Dr. Beverely Low that has done well for about the last 10 years) who is referred from Ortho for concerns of rt shoulder PJI. S/p (09/06/22)Revision of reverse rt shoulder arthroplasty, open synovectomy and open reduction of rt  shoulder dislocation. OR cx 2/3 samples rare proprionibacterium acnes. Fungal stain negative, cx pending. Patient was admitted 3/24-3/29 for fall. Orthopedics was consulted for left toe, left lateral tibial plateaue # and left lateral malleolus  # and was advised conservative management.   3/24 CT rt shoulder: IMPRESSION: 1. 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 2. Moderate-large volume of fluid within the subacromial-subdeltoid bursal space suggesting bursitis.  He did not have any fevers, chills or concerns of infection at the rt shoulder prior to the fall. Denies taking abtx prior to visit. He has no pain, tenderness, swelling at rt shoulder except limitation of ROM. Seen by Ortho yesterday. He had Vascular US of LLE today which was negative for DVT.   I spoke with Daughter [name] per patient's request to discuss plan of care. She said she will be back to [facility] by Sunday and will help  her father with receiving IV abtx at home. Patient lives in an apartment and also has a caretaker with him. Reports being legally blind but able to count fingers. Denies smoking, alcohol and IVDU. He has no complaints otherwise.   Started IV penicillin from 5/31. Taking linezolid prior to that without missing doses. Completed 6 weeks course on7/12 after which started on suppressive PO amoxicillin 500mg  po tid   12/10 Accompanied by his grand son. Taking po amoxicillin as prescribed, no concerns with abtx. He is scheduled for Lipoma removal of his neck tomorrow. ROM of left shoulder has significantly improved. He is following with Dr Christell Constant for lumbar stenosis/radicular back pain. Seen nby surgery 10/30 for lipoma of neck and plan for removal tomorrow.  Seen by PCP on 11/18 and Podiatry for inflammatory capsulitis of bilateral MPJ and received steroid injections in his 2nd and 3rd MPJ b/l. No concerns otherwise.   1/16 Stopped amoxicillin  1.5 weeks ago as he did not pay attention. 12/11 he had lipoma removed from his neck and no concerns. He has steroid injection for his rt knee pain 12/18. He is following Dr Christell Constant and reports plan for laminectomy in Feb. He has no concerns in his rt shoulder and has almost gained full mobility of rt shoulder.   Review of Systems: all systems reviewed with pertinent positives and negatives as listed above   Past Medical History:  Diagnosis Date   Abnormal involuntary movements(781.0)    Arthritis    Chicken pox    Claudication (HCC)    Colon polyps    Diabetes mellitus type II    Erectile dysfunction    Hyperlipidemia    Hypertension    Neoplasm of skin    uncertain behavior   Sleep apnea    Stroke [facility])    Past Surgical History:  Procedure Laterality Date   CARPAL TUNNEL RELEASE     right   HAND SURGERY     KNEE SURGERY     LIPOMA EXCISION Right 04/25/2023   Procedure: EXCISION LIPOMA POSTERIOR RIGHT NECK;  Surgeon: Lysle Rubens, MD;  Location: [facility] OR;  Service: General;  Laterality: Right;  LMA   MINOR HARDWARE REMOVAL Right 09/06/2022   Procedure: MINOR HARDWARE REMOVAL;  Surgeon: Bjorn Pippin, MD;  Location: [facility] ORS;  Service: Orthopedics;  Laterality: Right;   NECK SURGERY     ORIF HUMERUS FRACTURE Right 09/06/2022   Procedure: OPEN REDUCTION INTERNAL FIXATION (ORIF) PROXIMAL HUMERUS FRACTURE;  Surgeon: Bjorn Pippin, MD;  Location: [facility] ORS;  Service: Orthopedics;  Laterality: Right;   SHOULDER SURGERY Right    2020   SHOULDER SURGERY Left    2011   TOTAL SHOULDER REVISION Right 09/06/2022   Procedure: TOTAL SHOULDER REVISION;  Surgeon: Bjorn Pippin, MD;  Location: [facility] ORS;  Service: Orthopedics;  Laterality: Right;    Social History   Tobacco Use   Smoking status: Former    Current packs/day: 0.00    Types: Cigarettes    Quit date: 05/15/2009    Years since quitting: 14.0    Passive exposure: Past   Smokeless tobacco: Never  Vaping Use   Vaping status: Never Used   Substance Use Topics   Alcohol use: Yes    Alcohol/week: 1.0 standard drink of alcohol    Types: 1 Shots of liquor per week    Comment: vodka, weekly   Drug use: No    Family History  Problem Relation Age of Onset   Hypertension Mother    Heart disease Mother    Stroke Father    Colon cancer Neg Hx    Stomach cancer Neg Hx    Breast cancer Sister    Colon polyps Sister     Allergies  Allergen Reactions   Doxycycline Nausea And Vomiting   Oxycodone-Acetaminophen Itching    Health Maintenance  Topic Date Due   OPHTHALMOLOGY EXAM  07/07/2021   COVID-19 Vaccine (6 - 2024-25 season) 01/14/2023   Medicare Annual Wellness (AWV)  06/21/2023   HEMOGLOBIN A1C  06/09/2023   Diabetic kidney evaluation - Urine ACR  04/01/2024   FOOT EXAM  04/01/2024   Diabetic kidney evaluation - eGFR measurement  04/23/2024   DTaP/Tdap/Td (6 - Td or Tdap) 03/14/2028   Pneumonia Vaccine 25+ Years old  Completed   INFLUENZA VACCINE  Completed   Zoster Vaccines- Shingrix  Completed   HPV VACCINES  Aged Out   Colonoscopy  Discontinued    Objective: BP 121/82   Pulse 84   Temp 98 F (36.7 C) (Oral)   Wt 259 lb (117.5 kg)   SpO2 91%   BMI 39.38 kg/m   Physical Exam Constitutional:      Appearance: Normal appearance. Morbidly obese, ambulatory with a cane.  HENT:     Head: Normocephalic and atraumatic.      Mouth: Mucous membranes are moist.  Eyes:    Conjunctiva/sclera: Conjunctivae normal.     Pupils: Pupils are equal, round, and bilaterally symmetrical   Cardiovascular:     Rate and Rhythm: Normal rate and regular rhythm.     Heart sounds:  Pulmonary:     Effort: Pulmonary effort is normal.     Breath sounds:  Abdominal:     General: Non distended     Palpations:  Musculoskeletal:        General:  RT shoulder reverse shoulder arthroplasty surgical site has healed. ROM of rt shoulder is almost back to normal   Skin:    General: Skin is warm and dry.     Comments:  posterior neck, site of lipoma excision ok with no signs of infection  Neurological:     General: grossly non focal     Mental Status: awake, alert and  oriented to person, place, and time.   Psychiatric:        Mood and Affect: Mood normal.   Lab Results Lab Results  Component Value Date   WBC 12.1 (H) 04/24/2023   HGB 15.2 04/24/2023   HCT 45.2 04/24/2023   MCV 88.1 04/24/2023   PLT 303 04/24/2023    Lab Results  Component Value Date   CREATININE 0.87 04/24/2023   BUN 9 04/24/2023   NA 137 04/24/2023   K 4.9 04/24/2023   CL 99 04/24/2023   CO2 28 04/24/2023    Lab Results  Component Value Date   ALT 15 10/04/2022   AST 14 10/04/2022   ALKPHOS 222 (H) 08/06/2022   BILITOT 0.3 10/04/2022    Lab Results  Component Value Date   CHOL 182 02/24/2011   HDL 74.60 02/24/2011   LDLCALC 92 02/24/2011   TRIG 78.0 02/24/2011   CHOLHDL 2 02/24/2011   No results found for: "LABRPR", "RPRTITER" No results found for: "HIV1RNAQUANT", "HIV1RNAVL", "CD4TABS"  Microbiology Results for orders placed or performed during the hospital encounter of 09/06/22  Surgical pcr screen     Status: Abnormal   Collection Time: 09/06/22 10:14 AM   Specimen: Nasal Mucosa; Nasal Swab  Result Value Ref Range Status   MRSA, PCR NEGATIVE NEGATIVE Final   Staphylococcus aureus POSITIVE (A) NEGATIVE Final    Comment: (NOTE) The Xpert SA Assay (FDA approved for NASAL specimens in patients 74 years of age and older), is one component of a comprehensive surveillance program. It is not intended to diagnose infection nor to guide or monitor treatment. Performed at [facility], [address], [place], [place] [address]   Fungus Culture With Stain     Status: None   Collection Time: 09/06/22  1:40 PM   Specimen: Synovial, Right Shoulder; Body Fluid  Result Value Ref Range Status   Fungus Stain Final report  Final   Fungus (Mycology) Culture Final report  Final    Comment:  (NOTE) Performed At: [facility] [address] [place], [place] [id number] Jolene Schimke MD XL:[id number]    Fungal Source TISSUE  Final    Comment: Performed at [facility], [address], [place], [place] [address]  Fungus Culture With Stain     Status: None   Collection Time: 09/06/22  1:40 PM   Specimen: Synovial, Right Shoulder; Body Fluid  Result Value Ref Range Status   Fungus Stain Final report  Final   Fungus (Mycology) Culture Final report  Final    Comment: (NOTE) Performed At: [facility] [address] [place], [place] [id number] Jolene Schimke MD GL:[id number]    Fungal Source TISSUE  Final    Comment: Performed at [facility], [address] [place], [place] [address]  Fungus Culture With Stain     Status: None   Collection Time: 09/06/22  1:40 PM   Specimen: Synovial, Right Shoulder; Body Fluid  Result Value Ref Range Status   Fungus Stain Final report  Final   Fungus (Mycology) Culture Final report  Final    Comment: (NOTE) Performed At: [facility] [address] [place], [place] [id number] Jolene Schimke MD FU:[id number]    Fungal Source TISSUE  Final    Comment: Performed at [facility], [address] [place], [place] [address]  Aerobic/Anaerobic Culture w Gram Stain (surgical/deep wound)     Status: None   Collection Time: 09/06/22  1:40 PM  Specimen: Synovial, Right Shoulder; Body Fluid  Result Value Ref Range Status   Specimen Description   Final    TISSUE Performed at [facility], [address], [place], [place] [address]    Special Requests R SHOULDER HOLD 21 DAYS  Final   Gram Stain NO WBC SEEN NO ORGANISMS SEEN   Final   Culture   Final    NO GROWTH 21 DAYS CONTINUING TO HOLD Performed at [facility], [address] [place], [place] [address]    Report Status 09/27/2022 FINAL  Final  Aerobic/Anaerobic Culture w Gram  Stain (surgical/deep wound)     Status: None   Collection Time: 09/06/22  1:40 PM   Specimen: Synovial, Right Shoulder; Body Fluid  Result Value Ref Range Status   Specimen Description   Final    TISSUE Performed at [facility], [address] [place], [place] [address]    Special Requests   Final    R SHOULDER Performed at [facility], [address] [place], [place] [address]    Gram Stain NO WBC SEEN NO ORGANISMS SEEN   Final   Culture   Final    RARE PROPIONIBACTERIUM ACNES Standardized susceptibility testing for this organism is not available. Performed at [facility], [address], [place], [place] [address]    Report Status 09/11/2022 FINAL  Final  Aerobic/Anaerobic Culture w Gram Stain (surgical/deep wound)     Status: None   Collection Time: 09/06/22  1:40 PM   Specimen: Synovial, Right Shoulder; Body Fluid  Result Value Ref Range Status   Specimen Description   Final    TISSUE Performed at [facility], [address], [place], [place] [address]    Special Requests   Final    R SHOULDER Performed at [facility], [address] [place], [place] [address]    Gram Stain NO WBC SEEN NO ORGANISMS SEEN   Final   Culture   Final    RARE PROPIONIBACTERIUM ACNES NO ANAEROBES ISOLATED CRITICAL RESULT CALLED TO, READ BACK BY AND VERIFIED WITH: RN [email or web address].P AT 0913 ON 09/25/2022 BY T.SAAD Performed at [facility], [address] [place], [place] [address]    Report Status 09/25/2022 FINAL  Final  Fungus Culture Result     Status: None   Collection Time: 09/06/22  1:40 PM  Result Value Ref Range Status   Result 1 Comment  Final    Comment: (NOTE) KOH/Calcofluor preparation:  no fungus observed. Performed At: [facility] [address] [place], [place] [id number] Jolene Schimke MD QI:[id number]   Fungus Culture Result     Status: None   Collection Time: 09/06/22   1:40 PM  Result Value Ref Range Status   Result 1 Comment  Final    Comment: (NOTE) KOH/Calcofluor preparation:  no fungus observed. Performed At: [facility] [address] [place], [place] [id number] Jolene Schimke MD JJ:[id number]   Fungus Culture Result     Status: None   Collection Time: 09/06/22  1:40 PM  Result Value Ref Range Status   Result 1 Comment  Final    Comment: (NOTE) KOH/Calcofluor preparation:  no fungus observed. Performed At: [facility] [address] [place], [place] [id number] Jolene Schimke MD FT:[id number]   Fungal organism reflex     Status: None   Collection Time: 09/06/22  1:40 PM  Result Value Ref Range Status   Fungal  result 1 Comment  Final    Comment: (NOTE) No yeast or mold isolated after 4 weeks. Performed At: [facility] [address] [place], [place] [id number] Jolene Schimke MD WU:[id number]   Fungal organism reflex     Status: None   Collection Time: 09/06/22  1:40 PM  Result Value Ref Range Status   Fungal result 1 Comment  Final    Comment: (NOTE) No yeast or mold isolated after 4 weeks. Performed At: [facility] [address] [place], [place] [id number] Jolene Schimke MD VH:[id number]   Fungal organism reflex     Status: None   Collection Time: 09/06/22  1:40 PM  Result Value Ref Range Status   Fungal result 1 Comment  Final    Comment: (NOTE) No yeast or mold isolated after 4 weeks. Performed At: [facility] [address] [place], [place] [id number] Jolene Schimke MD UV:[id number]    Imaging  Assessment/Plan 37 Y O male with PMH as below including legal blindness, hypertension, hyperlipidemia, diabetes mellitus, gout, peripheral neuropathy,  gait instability with recurrent falls, Bilateral shoulder arthroplasties (  right shoulder reverse shoulder arthroplasty done with [employer] implants about four years ago in [place], left reverse total shoulder arthroplasty done by  Dr. Beverely Low that has done well for about the last 10 years) with   # Rt shoulder PJI  - s/p rt revision reverse shoulder arthroplasty, open synovectomy and open treatment of shoulder dislocation. 2/3 Cx Proprionibacterium acnes, Fungal stain negative, fungal cx  no growth  - Completed 6 weeks of IV abtx on 11/24/22 ( linezolid prior to that) after which started on amoxicillin 500mg  po tid from 7/13, stopped 1.5 weeks prior to today's visit  - Fu as needed   # Medication monitoring  - no need for labs today    # Low back pain w radiculopathy/Lumbar spinal stenosis - Plan for laminectomy per patient report - Follows Dr Christell Constant  I have personally spent 30 minutes involved in face-to-face and non-face-to-face activities for this patient on the day of the visit. Professional time spent includes the following activities: Preparing to see the patient (review of tests), Obtaining and/or reviewing separately obtained history (admission/discharge record), Performing a medically appropriate examination and/or evaluation , Ordering medications/tests/procedures, referring and communicating with other health care professionals, Documenting clinical information in the EMR, Independently interpreting results (not separately reported), Communicating results to the patient/family/caregiver, Counseling and educating the patient/family/caregiver and Care coordination (not separately reported).   Kenneth Lain, MD [facility] 05/31/2023, 10:26 AM

## 2023-06-04 ENCOUNTER — Ambulatory Visit (INDEPENDENT_AMBULATORY_CARE_PROVIDER_SITE_OTHER): Payer: Medicare Other | Admitting: Orthopedic Surgery

## 2023-06-04 DIAGNOSIS — M48062 Spinal stenosis, lumbar region with neurogenic claudication: Secondary | ICD-10-CM | POA: Diagnosis not present

## 2023-06-04 NOTE — Progress Notes (Addendum)
 Orthopedic Spine Surgery Office Note   Assessment: Patient is a 83 y.o. male with low back pain that radiates into the bilateral buttocks and right lateral thigh.  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  He wanted to have another conversation about it with his daughter.  I had a conversation in the office today with both him and his daughter.  After this discussion, he elected to proceed with operative management -Patient will next be seen a date of surgery     The patient has symptoms consistent with lumbar stenosis. The patient's symptoms were not getting improvement with conservative treatment so operative management was discussed in the form of L4 and L5 segment laminectomies and partial medial facetectomies. The risks including but not limited to iatrogenic instability, dural tear, nerve root injury, paralysis, persistent pain, infection, bleeding, heart attack, death, stroke, fracture, dvt/pe, and need for additional procedures were discussed with the patient. The benefit of the surgery would be improvement in the patient's radiating leg pain. I explained that back pain relief is not reliably alleviated with this surgery. The alternatives to surgical management were covered with the patient and included continued monitoring, physical therapy, over-the-counter pain medications, ambulatory aids, repeat injections, and activity modification. All the patient's questions were answered to his satisfaction.  Daughter's questions were also answered to her satisfaction.  After this discussion, the patient expressed understanding and elected to proceed with surgical intervention.      ___________________________________________________________________________     History:   Patient is a 83 y.o. male who presents  today for follow-up on his lumbar spine.  Patient is still having low back pain that radiates into his bilateral buttocks and into the right lateral thigh.  The thigh pain has become more consistent.  He feels it over the posterior and lateral aspects of the thigh.  It does not radiate past the knee.  He has not developed the thigh pain in the left lower extremity.  He notes his back, bilateral buttock, and right thigh pain is present when he is standing or walking.  He is unable to do more than 2 minutes of standing or walking before he has to sit down.  He said his pain nearly resolves when he sits down.  He was unable to walk through the building today because of the pain and was brought to the exam room by wheelchair because of the pain.  He has not developed any new symptoms since he was last seen, just worsening pain.  Denies bowel or bladder incontinence.  No saddle anesthesia.  No new weakness in either lower extremity.   Treatments tried: Tylenol, ibuprofen, gabapentin, steroid injection, lidocaine patches, tizanidine     Physical Exam:   General: no acute distress, appears stated age Neurologic: alert, answering questions appropriately, following commands Respiratory: unlabored breathing on room air, symmetric chest rise Psychiatric: appropriate affect, normal cadence to speech     MSK (spine):   -Strength exam                                                   Left                  Right  EHL                              5/5                  5/5 TA                                 5/5                  5/5 GSC                             5/5                  5/5 Knee extension            5/5                  5/5 Hip flexion                    5/5                  5/5   -Sensory exam                           Sensation intact to light touch in L2-S1 nerve distributions of bilateral lower extremities   -Achilles DTR: 1/4 on the left, 1/4 on the right -Patellar tendon DTR: 2/4 on the  left, 2/4 on the right   -Straight leg raise: negative bilaterally -Femoral nerve stretch test: negative bilaterally -Clonus: no beats bilaterally -Palpable DP pulses bilaterally, feet warm and well perfused   -Left hip exam: no pain through range of motion, negative stinchfield, negative faber, negative SI joint compression test -Right hip exam: no pain through range of motion, negative stinchfield, negative faber, negative SI joint compression test   Imaging: XRs of the lumbar spine from 03/22/2023 were previously independently reviewed and interpreted, showing syndesmophytes seen at T11/T12 and T12/L1. Disc height loss at L3/4 and L4/5. No fracture or dislocation seen. No evidence of instability on flexion/extension views.    MRI of the lumbar spine from 02/13/2023 was previously independently reviewed and interpreted, showing bilateral foraminal stenosis at L3/4 and L4/5. Central and lateral recess stenosis at L4/5.      Patient name: Kenneth Henson Patient MRN: [id number] Date of visit: 06/04/23  Pre-operative Scores  ODI: 68% VAS back: 10/10 VAS leg: 7/10

## 2023-06-14 ENCOUNTER — Encounter (HOSPITAL_BASED_OUTPATIENT_CLINIC_OR_DEPARTMENT_OTHER): Payer: Self-pay | Admitting: Family Medicine

## 2023-06-14 ENCOUNTER — Ambulatory Visit (INDEPENDENT_AMBULATORY_CARE_PROVIDER_SITE_OTHER): Payer: Medicare Other | Admitting: Family Medicine

## 2023-06-14 VITALS — BP 153/81 | HR 76 | Temp 98.0°F | Ht 68.0 in | Wt 261.4 lb

## 2023-06-14 DIAGNOSIS — E1142 Type 2 diabetes mellitus with diabetic polyneuropathy: Secondary | ICD-10-CM | POA: Diagnosis not present

## 2023-06-14 DIAGNOSIS — Z7729 Contact with and (suspected ) exposure to other hazardous substances: Secondary | ICD-10-CM | POA: Insufficient documentation

## 2023-06-14 DIAGNOSIS — G629 Polyneuropathy, unspecified: Secondary | ICD-10-CM

## 2023-06-14 DIAGNOSIS — Z01818 Encounter for other preprocedural examination: Secondary | ICD-10-CM

## 2023-06-14 LAB — POCT GLYCOSYLATED HEMOGLOBIN (HGB A1C)
HbA1c POC (<> result, manual entry): 7.3 % (ref 4.0–5.6)
Hemoglobin A1C: 7.3 % — AB (ref 4.0–5.6)

## 2023-06-14 NOTE — Assessment & Plan Note (Signed)
Based on history and exam will order the following preoperative tests: Hemoglobin A1c as this has not been done within the past few months.  He does report that he had an EKG completed at the [place] recently.  Unfortunately, we do not have EKG results, however he reports that there were no concerns with EKG Patient is medically cleared to proceed with planned surgery.

## 2023-06-14 NOTE — Progress Notes (Signed)
    Procedures performed today:    None.  Independent interpretation of notes and tests performed by another provider:   None.  Brief History, Exam, Impression, and Recommendations:    Kenneth Henson is a 83 y.o. presenting for preoperative evaluation/clearance. The planned procedure is L4-5 laminectomy with the treatment goal of reduced pain and improved function. Cardiac risk for planned procedure is Intermediate (1 to 5%) - intraperitoneal or intrathoracic surgery, carotid endarterectomy, head and neck surgery, orthopedic surgery, prostate surgery  Signs or symptoms of cardiovascular disease? No New or unstable cardiopulmonary signs or symptoms? No Urinary symptoms or invasive urologic procedure? No  Patient also has questions about diabetic neuropathy.  He continues with gabapentin.  Milligrams 3 times daily.  Still currently with symptoms.  Not noticing any significant side effects from medication.  Does have questions about current medication regimen or alternative options.  BP (!) 153/81 (BP Location: Right Arm, Patient Position: Sitting, Cuff Size: Large)   Pulse 76   Temp 98 F (36.7 C) (Oral)   Ht 5\' 8"  (1.727 m)   Wt 261 lb 6.4 oz (118.6 kg)   SpO2 94%   BMI 39.75 kg/m   Exam: Patient is in no acute distress, vital signs stable Cardiovascular exam regular rate and rhythm Lungs clear to auscultation bilaterally  Neuropathy Discussed options.  We will proceed with dose titration of gabapentin.  Can continue with 1 capsule in the morning and 1 capsule midday and increase nighttime administration to 2 capsules at night and we will monitor progress moving forward  Preoperative clearance Based on history and exam will order the following preoperative tests: Hemoglobin A1c as this has not been done within the past few months.  He does report that he had an EKG completed at the [place] recently.  Unfortunately, we do not have EKG results, however he reports that there were no  concerns with EKG Patient is medically cleared to proceed with planned surgery.   ___________________________________________ Claudell Rhody de [place], MD, ABFM, CAQSM Primary Care and Sports Medicine [facility]

## 2023-06-14 NOTE — Assessment & Plan Note (Signed)
Discussed options.  We will proceed with dose titration of gabapentin.  Can continue with 1 capsule in the morning and 1 capsule midday and increase nighttime administration to 2 capsules at night and we will monitor progress moving forward

## 2023-06-14 NOTE — Patient Instructions (Signed)
  Medication Instructions:  Your physician recommends that you continue on your current medications as directed. Please refer to the Current Medication list given to you today. --If you need a refill on any your medications before your next appointment, please call your pharmacy first. If no refills are authorized on file call the office.--   Follow-Up: Your next appointment:   Your physician recommends that you schedule a follow-up appointment in: with already scheduled appt with Dr. de [place]  You will receive a text message or e-mail with a link to a survey about your care and experience with [place] today! We would greatly appreciate your feedback!   Thanks for letting us be apart of your health journey!!  Primary Care and Sports Medicine   Dr. Ceasar Mons [place]   We encourage you to activate your patient portal called "MyChart".  Sign up information is provided on this After Visit Summary.  MyChart is used to connect with patients for Virtual Visits (Telemedicine).  Patients are able to view lab/test results, encounter notes, upcoming appointments, etc.  Non-urgent messages can be sent to your provider as well. To learn more about what you can do with MyChart, please visit --  [email or web address].

## 2023-06-26 ENCOUNTER — Encounter (HOSPITAL_BASED_OUTPATIENT_CLINIC_OR_DEPARTMENT_OTHER): Payer: TRICARE For Life (TFL)

## 2023-07-03 ENCOUNTER — Encounter (HOSPITAL_BASED_OUTPATIENT_CLINIC_OR_DEPARTMENT_OTHER): Payer: TRICARE For Life (TFL)

## 2023-07-03 NOTE — Pre-Procedure Instructions (Signed)
 Surgical Instructions   Your procedure is scheduled on July 10, 2023. Report to [facility] Main Entrance "A" at 5:30 A.M., then check in with the Admitting office. Any questions or running late day of surgery: call [phone]  Questions prior to your surgery date: call [phone], Monday-Friday, 8am-4pm. If you experience any cold or flu symptoms such as cough, fever, chills, shortness of breath, etc. between now and your scheduled surgery, please notify us at the above number.     Remember:  Do not eat after midnight the night before your surgery  You may drink clear liquids until 4:30 AM the morning of your surgery.   Clear liquids allowed are: Water, Non-Citrus Juices (without pulp), Carbonated Beverages, Clear Tea (no milk, honey, etc.), Black Coffee Only (NO MILK, CREAM OR POWDERED CREAMER of any kind), and Gatorade.  Patient Instructions  The night before surgery:  No food after midnight. ONLY clear liquids after midnight  The day of surgery (if you have diabetes): Drink ONE (1) 12 oz G2 given to you in your pre admission testing appointment by 4:30 AM the morning of surgery. Drink in one sitting. Do not sip.  This drink was given to you during your hospital  pre-op appointment visit.  Nothing else to drink after completing the  12 oz bottle of G2.         If you have questions, please contact your surgeon's office.    Take these medicines the morning of surgery with A SIP OF WATER: amLODipine (NORVASC)  gabapentin (NEURONTIN)    May take these medicines IF NEEDED: allopurinol (ZYLOPRIM)  diphenhydrAMINE (BENADRYL)    Follow your surgeon's instructions on when to stop Aspirin.  If no instructions were given by your surgeon then you will need to call the office to get those instructions.     One week prior to surgery, STOP taking any Aleve, Naproxen, Ibuprofen, Motrin, Advil, Goody's, BC's, all herbal medications, fish oil, and non-prescription  vitamins.   WHAT DO I DO ABOUT MY DIABETES MEDICATION?   Do not take metFORMIN (GLUCOPHAGE) the morning of surgery.   HOW TO MANAGE YOUR DIABETES BEFORE AND AFTER SURGERY  Why is it important to control my blood sugar before and after surgery? Improving blood sugar levels before and after surgery helps healing and can limit problems. A way of improving blood sugar control is eating a healthy diet by:  Eating less sugar and carbohydrates  Increasing activity/exercise  Talking with your doctor about reaching your blood sugar goals High blood sugars (greater than 180 mg/dL) can raise your risk of infections and slow your recovery, so you will need to focus on controlling your diabetes during the weeks before surgery. Make sure that the doctor who takes care of your diabetes knows about your planned surgery including the date and location.  How do I manage my blood sugar before surgery? Check your blood sugar at least 4 times a day, starting 2 days before surgery, to make sure that the level is not too high or low.  Check your blood sugar the morning of your surgery when you wake up and every 2 hours until you get to the Short Stay unit.  If your blood sugar is less than 70 mg/dL, you will need to treat for low blood sugar: Do not take insulin. Treat a low blood sugar (less than 70 mg/dL) with  cup of clear juice (cranberry or apple), 4 glucose tablets, OR glucose gel. Recheck blood sugar in 15  minutes after treatment (to make sure it is greater than 70 mg/dL). If your blood sugar is not greater than 70 mg/dL on recheck, call [phone] for further instructions. Report your blood sugar to the short stay nurse when you get to Short Stay.  If you are admitted to the hospital after surgery: Your blood sugar will be checked by the staff and you will probably be given insulin after surgery (instead of oral diabetes medicines) to make sure you have good blood sugar levels. The goal for  blood sugar control after surgery is 80-180 mg/dL.                      Do NOT Smoke (Tobacco/Vaping) for 24 hours prior to your procedure.  If you use a CPAP at night, you may bring your mask/headgear for your overnight stay.   You will be asked to remove any contacts, glasses, piercing's, hearing aid's, dentures/partials prior to surgery. Please bring cases for these items if needed.    Patients discharged the day of surgery will not be allowed to drive home, and someone needs to stay with them for 24 hours.  SURGICAL WAITING ROOM VISITATION Patients may have no more than 2 support people in the waiting area - these visitors may rotate.   Pre-op nurse will coordinate an appropriate time for 1 ADULT support person, who may not rotate, to accompany patient in pre-op.  Children under the age of 65 must have an adult with them who is not the patient and must remain in the main waiting area with an adult.  If the patient needs to stay at the hospital during part of their recovery, the visitor guidelines for inpatient rooms apply.  Please refer to the [facility] website for the visitor guidelines for any additional information.   If you received a COVID test during your pre-op visit  it is requested that you wear a mask when out in public, stay away from anyone that may not be feeling well and notify your surgeon if you develop symptoms. If you have been in contact with anyone that has tested positive in the last 10 days please notify you surgeon.      Pre-operative 5 CHG Bathing Instructions   You can play a key role in reducing the risk of infection after surgery. Your skin needs to be as free of germs as possible. You can reduce the number of germs on your skin by washing with CHG (chlorhexidine gluconate) soap before surgery. CHG is an antiseptic soap that kills germs and continues to kill germs even after washing.   DO NOT use if you have an allergy to chlorhexidine/CHG or  antibacterial soaps. If your skin becomes reddened or irritated, stop using the CHG and notify one of our RNs at [phone].   Please shower with the CHG soap starting 4 days before surgery using the following schedule:     Please keep in mind the following:  DO NOT shave, including legs and underarms, starting the day of your first shower.   You may shave your face at any point before/day of surgery.  Place clean sheets on your bed the day you start using CHG soap. Use a clean washcloth (not used since being washed) for each shower. DO NOT sleep with pets once you start using the CHG.   CHG Shower Instructions:  Wash your face and private area with normal soap. If you choose to wash your hair, wash first with  your normal shampoo.  After you use shampoo/soap, rinse your hair and body thoroughly to remove shampoo/soap residue.  Turn the water OFF and apply about 3 tablespoons (45 ml) of CHG soap to a CLEAN washcloth.  Apply CHG soap ONLY FROM YOUR NECK DOWN TO YOUR TOES (washing for 3-5 minutes)  DO NOT use CHG soap on face, private areas, open wounds, or sores.  Pay special attention to the area where your surgery is being performed.  If you are having back surgery, having someone wash your back for you may be helpful. Wait 2 minutes after CHG soap is applied, then you may rinse off the CHG soap.  Pat dry with a clean towel  Put on clean clothes/pajamas   If you choose to wear lotion, please use ONLY the CHG-compatible lotions that are listed below.  Additional instructions for the day of surgery: DO NOT APPLY any lotions, deodorants, cologne, or perfumes.   Do not bring valuables to the hospital. [facility] is not responsible for any belongings/valuables. Do not wear nail polish, gel polish, artificial nails, or any other type of covering on natural nails (fingers and toes) Do not wear jewelry or makeup Put on clean/comfortable clothes.  Please brush your teeth.  Ask your nurse  before applying any prescription medications to the skin.     CHG Compatible Lotions   Aveeno Moisturizing lotion  Cetaphil Moisturizing Cream  Cetaphil Moisturizing Lotion  Clairol Herbal Essence Moisturizing Lotion, Dry Skin  Clairol Herbal Essence Moisturizing Lotion, Extra Dry Skin  Clairol Herbal Essence Moisturizing Lotion, Normal Skin  Curel Age Defying Therapeutic Moisturizing Lotion with Alpha Hydroxy  Curel Extreme Care Body Lotion  Curel Soothing Hands Moisturizing Hand Lotion  Curel Therapeutic Moisturizing Cream, Fragrance-Free  Curel Therapeutic Moisturizing Lotion, Fragrance-Free  Curel Therapeutic Moisturizing Lotion, Original Formula  Eucerin Daily Replenishing Lotion  Eucerin Dry Skin Therapy Plus Alpha Hydroxy Crme  Eucerin Dry Skin Therapy Plus Alpha Hydroxy Lotion  Eucerin Original Crme  Eucerin Original Lotion  Eucerin Plus Crme Eucerin Plus Lotion  Eucerin TriLipid Replenishing Lotion  Keri Anti-Bacterial Hand Lotion  Keri Deep Conditioning Original Lotion Dry Skin Formula Softly Scented  Keri Deep Conditioning Original Lotion, Fragrance Free Sensitive Skin Formula  Keri Lotion Fast Absorbing Fragrance Free Sensitive Skin Formula  Keri Lotion Fast Absorbing Softly Scented Dry Skin Formula  Keri Original Lotion  Keri Skin Renewal Lotion Keri Silky Smooth Lotion  Keri Silky Smooth Sensitive Skin Lotion  Nivea Body Creamy Conditioning Oil  Nivea Body Extra Enriched Lotion  Nivea Body Original Lotion  Nivea Body Sheer Moisturizing Lotion Nivea Crme  Nivea Skin Firming Lotion  NutraDerm 30 Skin Lotion  NutraDerm Skin Lotion  NutraDerm Therapeutic Skin Cream  NutraDerm Therapeutic Skin Lotion  ProShield Protective Hand Cream  Provon moisturizing lotion  Please read over the following fact sheets that you were given.

## 2023-07-04 ENCOUNTER — Encounter (HOSPITAL_COMMUNITY): Payer: Self-pay

## 2023-07-04 ENCOUNTER — Other Ambulatory Visit: Payer: Self-pay

## 2023-07-04 ENCOUNTER — Encounter (HOSPITAL_COMMUNITY)
Admission: RE | Admit: 2023-07-04 | Discharge: 2023-07-04 | Disposition: A | Discharge status: Home / Self Care | Payer: Medicare Other | Source: Ambulatory Visit | Attending: Orthopedic Surgery | Admitting: Orthopedic Surgery

## 2023-07-04 DIAGNOSIS — Z8673 Personal history of transient ischemic attack (TIA), and cerebral infarction without residual deficits: Secondary | ICD-10-CM | POA: Insufficient documentation

## 2023-07-04 DIAGNOSIS — Z6841 Body Mass Index (BMI) 40.0 and over, adult: Secondary | ICD-10-CM | POA: Diagnosis not present

## 2023-07-04 DIAGNOSIS — H548 Legal blindness, as defined in USA: Secondary | ICD-10-CM | POA: Insufficient documentation

## 2023-07-04 DIAGNOSIS — Z87891 Personal history of nicotine dependence: Secondary | ICD-10-CM | POA: Diagnosis not present

## 2023-07-04 DIAGNOSIS — E1151 Type 2 diabetes mellitus with diabetic peripheral angiopathy without gangrene: Secondary | ICD-10-CM | POA: Insufficient documentation

## 2023-07-04 DIAGNOSIS — F4310 Post-traumatic stress disorder, unspecified: Secondary | ICD-10-CM | POA: Diagnosis not present

## 2023-07-04 DIAGNOSIS — E785 Hyperlipidemia, unspecified: Secondary | ICD-10-CM | POA: Diagnosis not present

## 2023-07-04 DIAGNOSIS — G4733 Obstructive sleep apnea (adult) (pediatric): Secondary | ICD-10-CM | POA: Diagnosis not present

## 2023-07-04 DIAGNOSIS — M1990 Unspecified osteoarthritis, unspecified site: Secondary | ICD-10-CM | POA: Diagnosis not present

## 2023-07-04 DIAGNOSIS — Z01812 Encounter for preprocedural laboratory examination: Secondary | ICD-10-CM | POA: Insufficient documentation

## 2023-07-04 DIAGNOSIS — M48062 Spinal stenosis, lumbar region with neurogenic claudication: Secondary | ICD-10-CM | POA: Insufficient documentation

## 2023-07-04 DIAGNOSIS — I10 Essential (primary) hypertension: Secondary | ICD-10-CM | POA: Insufficient documentation

## 2023-07-04 DIAGNOSIS — Z01818 Encounter for other preprocedural examination: Secondary | ICD-10-CM

## 2023-07-04 HISTORY — DX: Angina pectoris, unspecified: I20.9

## 2023-07-04 HISTORY — DX: Post-traumatic stress disorder, unspecified: F43.10

## 2023-07-04 LAB — CBC
HCT: 45.1 % (ref 39.0–52.0)
Hemoglobin: 15 g/dL (ref 13.0–17.0)
MCH: 29.9 pg (ref 26.0–34.0)
MCHC: 33.3 g/dL (ref 30.0–36.0)
MCV: 90 fL (ref 80.0–100.0)
Platelets: 278 K/uL (ref 150–400)
RBC: 5.01 MIL/uL (ref 4.22–5.81)
RDW: 13.2 % (ref 11.5–15.5)
WBC: 10.6 K/uL — ABNORMAL HIGH (ref 4.0–10.5)
nRBC: 0 % (ref 0.0–0.2)

## 2023-07-04 LAB — BASIC METABOLIC PANEL WITH GFR
Anion gap: 9 (ref 5–15)
BUN: 5 mg/dL — ABNORMAL LOW (ref 8–23)
CO2: 28 mmol/L (ref 22–32)
Calcium: 9.1 mg/dL (ref 8.9–10.3)
Chloride: 99 mmol/L (ref 98–111)
Creatinine, Ser: 0.83 mg/dL (ref 0.61–1.24)
GFR, Estimated: >60 mL/min
Glucose, Bld: 117 mg/dL — ABNORMAL HIGH (ref 70–99)
Potassium: 4.8 mmol/L (ref 3.5–5.1)
Sodium: 136 mmol/L (ref 135–145)

## 2023-07-04 LAB — GLUCOSE, CAPILLARY: Glucose-Capillary: 139 mg/dL — ABNORMAL HIGH (ref 70–99)

## 2023-07-04 LAB — SURGICAL PCR SCREEN
MRSA, PCR: POSITIVE — AB
Staphylococcus aureus: POSITIVE — AB

## 2023-07-04 NOTE — Progress Notes (Signed)
 PCP - Dr. Ceasar Mons [place] Cardiologist - denies  PPM/ICD - denies   Chest x-ray - 08/10/22 [facility]23[facility]- [facility] Stress Test - 10+ year ago, [facility]) normal per pt ECHO - 08/08/22 Cardiac Cath - denies  Sleep Study - OSA+ CPAP - nightly, unsure of pressure settings  DM- pt does not check CBG at home and does not know typical fasting levels  Last dose of GLP1 agonist-  n/a   Blood Thinner Instructions: n/a Aspirin Instructions: f/u with surgeon  ERAS Protcol - yes PRE-SURGERY G2- given at PAT  COVID TEST- n/a   Anesthesia review: yes, tracing requested  Patient denies shortness of breath, fever, cough and chest pain at PAT appointment   All instructions explained to the patient, with a verbal understanding of the material. Patient agrees to go over the instructions while at home for a better understanding. The opportunity to ask questions was provided.

## 2023-07-05 ENCOUNTER — Ambulatory Visit (HOSPITAL_BASED_OUTPATIENT_CLINIC_OR_DEPARTMENT_OTHER): Payer: Medicare Other | Admitting: Family Medicine

## 2023-07-05 NOTE — Progress Notes (Signed)
 LVM for April, surgery scheduler, notified of positive PCR results

## 2023-07-05 NOTE — Anesthesia Preprocedure Evaluation (Addendum)
 Anesthesia Evaluation    Airway Mallampati: II  TM Distance: >3 FB Neck ROM: Full    Dental no notable dental hx.    Pulmonary sleep apnea , former smoker   Pulmonary exam normal breath sounds clear to auscultation       Cardiovascular hypertension, Normal cardiovascular exam Rhythm:Regular Rate:Normal     Neuro/Psych  Headaches PSYCHIATRIC DISORDERS Anxiety Depression    Neurogenic claudication CVA    GI/Hepatic   Endo/Other  diabetes    Renal/GU      Musculoskeletal  (+) Arthritis ,    Abdominal   Peds  Hematology   Anesthesia Other Findings   Reproductive/Obstetrics                             Anesthesia Physical Anesthesia Plan  ASA: 3  Anesthesia Plan: General   Post-op Pain Management: Tylenol PO (pre-op)*   Induction: Intravenous  PONV Risk Score and Plan: 2 and Ondansetron and Dexamethasone  Airway Management Planned: Oral ETT  Additional Equipment:   Intra-op Plan:   Post-operative Plan: Extubation in OR  Informed Consent: I have reviewed the patients History and Physical, chart, labs and discussed the procedure including the risks, benefits and alternatives for the proposed anesthesia with the patient or authorized representative who has indicated his/her understanding and acceptance.     Dental advisory given  Plan Discussed with: CRNA  Anesthesia Plan Comments: (PAT note written 07/05/2023 by Shonna Chock, PA-C.  Patient is an 83 year old male scheduled for the above procedure.   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 BMI is consistent with morbid  obesity.     In March 2024 he fell and sustained a left lateral tibial plateau fracture. There was also moderate to large volume of fluid within the subacromial-subdeltoid bursal space suggesting bursitis. It seems that Hospitalist ordered an echocardiogram in anticipation of his needing surgery since he was mostly sedentary. Also EKG in ED showed IVCD, consider atypical LBBB. 08/08/22 echo showed LVEF 55-60%, no regional wall motion abnormalities, mild concentric LHV, grade II diastolic dysfunction, elevate LA pressure, normal RV systolic function, normal PASP, RVSP 29.4 mmHg, trivial MR, mild AV calcification with no AS noted. Orthopedics consulted but recommended conservative management. Later on 09/06/22 her underwent revision of right reverse TSA with synovectomy and treatment of dislocation.  )       Anesthesia Quick Evaluation

## 2023-07-05 NOTE — Progress Notes (Addendum)
 Anesthesia Chart Review:  Case: [id number] Date/Time: 07/10/23 0715   Procedure: L4-5 DECOMPRESSIVE LUMBAR LAMINECTOMY LEVEL 1   Anesthesia type: General   Pre-op diagnosis: LUMBAR STENOSIS WITH NEUROGENIC CLAUDICATION   Location: [facility] ROOM [facility] / [facility]   Surgeons: London Sheer, MD       DISCUSSION: Patient is an 83 year old male scheduled for the above procedure.  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 BMI is consistent with morbid obesity.   In March 2024 he fell and sustained a left lateral tibial plateau fracture. There was also moderate to large volume of fluid within the subacromial-subdeltoid bursal space suggesting bursitis. It seems that Hospitalist ordered an echocardiogram in anticipation of his needing surgery since he was mostly sedentary. Also EKG in ED showed IVCD, consider atypical LBBB. 08/08/22 echo showed LVEF 55-60%, no regional wall motion abnormalities, mild concentric LHV, grade II diastolic dysfunction, elevate LA pressure, normal RV systolic function, normal PASP, RVSP 29.4 mmHg, trivial MR, mild AV calcification with no AS noted. Orthopedics consulted but recommended conservative management. Later on 09/06/22 her underwent revision of right reverse TSA with synovectomy and treatment of dislocation.    He had a preoperative evaluation by de Peru, Buren Kos, MD on 06/14/23.  "Preoperative clearance Based on history and exam will order the following preoperative tests: Hemoglobin A1c as this has not been done within the past few months.  He does report that he had an EKG completed at the [place] recently.  Unfortunately, we do not have EKG results, however he reports  that there were no concerns with EKG Patient is medically cleared to proceed with planned surgery."  He had a EKG at the [facility] on 06/06/23 as part of QT monitoring for psychotropic medication. EKG tracing requested.   A1c 7.3% on 06/14/23. He is on metformin 850 mg daily.  Anesthesia team to evaluate on the day of surgery. Awaiting EKG tracing.  UPDATE 07/09/23 11:27 AM: He had neuropathy follow-up with PCP de Peru, [name], MD on 07/06/23. Note reviewed. 06/06/23 EKG received from [facility] and showed SR with first degree AV block (PR 226 ms), possible anteroseptal infarct, age undetermined. QT 440 ms, QTc 461 ms.     VS: BP (!) 156/66   Pulse 70   Temp (!) 36.4 C   Resp 19   Ht 5\' 8"  (1.727 m)   Wt 120 kg   SpO2 96%   BMI 40.22 kg/m    PROVIDERS: de Peru, Buren Kos, MD is PCP  Odette Fraction, MD is ID. Last visit 05/31/23. 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 He then relocated to [facility] for several years.   LABS: Labs reviewed: Acceptable for surgery. (all labs ordered are listed, but only abnormal results are displayed)  Labs Reviewed  SURGICAL PCR SCREEN - Abnormal; Notable for the following components:      Result Value   MRSA, PCR POSITIVE (*)    Staphylococcus aureus POSITIVE (*)    All other components within normal limits  GLUCOSE, CAPILLARY - Abnormal; Notable  for the following components:   Glucose-Capillary 139 (*)    All other components within normal limits  CBC - Abnormal; Notable for the following components:   WBC 10.6 (*)    All other components within normal limits  BASIC METABOLIC PANEL - Abnormal; Notable for the following components:   Glucose, Bld 117 (*)    BUN 5 (*)    All other components within normal limits   TSH 2.66, total bili 0.5, AST 19, ALT 30, A1c 6.7% on  12/01/22 [facility]).   IMAGES: MRI L-spine 02/13/23: IMPRESSION: 1. L5 is transitional with a transitional articulation on the right. 2. Chronic fusion across the L1-2 disc space with prominent anterior osteophytes. Bridging osteophytes at the other levels of the lumbar region, better shown by radiography. Question diffuse idiopathic skeletal hyperostosis. 3. L2-3: Mild stenosis of the canal and lateral recesses but no definite neural compression. 4. L3-4: Mild stenosis of both lateral recesses and bilateral foramina. Neural compression could occur at this level. 5. 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 Neural compression likely at this level.      EKG: EKG 06/06/23 [facility]): Requested. Care Everywhere note suggest EKG was done for QT monitoring for psychotropic medication. Per Psychiatry notation by Melbourne Abts, PA-C: "EKG in clinic today shows QTc 461 ms as well as computer read of sinus rhythm with first-degree AV block and possible anterior infarct of undetermined age. Of note, community [facility] April 2024 EKG also showed sinus rhythm with first-degree AV block as well as left bundle branch block, but did not mention  infarct/ischemia.  Patient's EKG was reviewed with cardiology (Dr. 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  EKG 08/06/22: Sinus rhythm Borderline prolonged PR interval Probable left atrial enlargement IVCD, consider atypical LBBB No previous ECGs available Confirmed by Alvira Monday ([id number]) on 08/07/2022 2:54:55 PM   CV: Echo 08/08/22: IMPRESSIONS   1. Left ventricular ejection fraction, by estimation, is 55 to 60%. The  left ventricle has normal function. The left ventricle has  no regional  wall motion abnormalities. There is mild concentric left ventricular  hypertrophy. Left ventricular diastolic  parameters are consistent with Grade II diastolic dysfunction  (pseudonormalization). Elevated left atrial pressure.   2. Right ventricular systolic function is normal. The right ventricular  size is normal. There is normal pulmonary artery systolic pressure. The  estimated right ventricular systolic pressure is 29.4 mmHg.   3. The mitral valve is normal in structure. Trivial mitral valve  regurgitation.   4. The aortic valve was not well visualized. There is mild calcification  of the aortic valve. There is mild thickening of the aortic valve. Aortic  valve regurgitation is not visualized. Aortic valve  sclerosis/calcification is present, without any  evidence of aortic stenosis.   5. The inferior vena cava is normal in size with greater than 50%  respiratory variability, suggesting right atrial pressure of 3 mmHg.   He believes he had a stress test at [facility] in [facility] ~ 10 years ago.  Nuclear stress test 12/15/09: FINDINGS: Normal nuclear study. LVEF 64%.   Past Medical History:  Diagnosis Date   Abnormal involuntary movements(781.0)    Anginal pain (HCC)    30 years ago   Arthritis  Chicken pox    Claudication (HCC)    Colon polyps    Diabetes mellitus type II    Erectile dysfunction    Hyperlipidemia    Hypertension    Neoplasm of skin    uncertain behavior   PTSD (post-traumatic stress disorder)    Sleep apnea    Stroke [facility])     Past Surgical History:  Procedure Laterality Date   CARPAL TUNNEL RELEASE     right   HAND SURGERY     KNEE SURGERY     LIPOMA EXCISION Right 04/25/2023   Procedure: EXCISION LIPOMA POSTERIOR RIGHT NECK;  Surgeon: Lysle Rubens, MD;  Location: [facility] OR;  Service: General;  Laterality: Right;  LMA   MINOR HARDWARE REMOVAL Right 09/06/2022   Procedure: MINOR HARDWARE REMOVAL;  Surgeon:  Bjorn Pippin, MD;  Location: [facility] ORS;  Service: Orthopedics;  Laterality: Right;   NECK SURGERY     ORIF HUMERUS FRACTURE Right 09/06/2022   Procedure: OPEN REDUCTION INTERNAL FIXATION (ORIF) PROXIMAL HUMERUS FRACTURE;  Surgeon: Bjorn Pippin, MD;  Location: [facility] ORS;  Service: Orthopedics;  Laterality: Right;   SHOULDER SURGERY Right    2020   SHOULDER SURGERY Left    2011   TOTAL SHOULDER REVISION Right 09/06/2022   Procedure: TOTAL SHOULDER REVISION;  Surgeon: Bjorn Pippin, MD;  Location: [facility] ORS;  Service: Orthopedics;  Laterality: Right;    MEDICATIONS:  allopurinol (ZYLOPRIM) 300 MG tablet   amLODipine (NORVASC) 5 MG tablet   aspirin EC 81 MG tablet   diphenhydrAMINE (BENADRYL) 25 mg capsule   diphenhydrAMINE HCl, Sleep, (ZZZQUIL) 50 MG/30ML LIQD   gabapentin (NEURONTIN) 300 MG capsule   lisinopril (PRINIVIL,ZESTRIL) 40 MG tablet   MELATONIN PO   metFORMIN (GLUCOPHAGE) 850 MG tablet   Multiple Vitamin (MULTIVITAMIN WITH MINERALS) TABS tablet   No current facility-administered medications for this encounter.    Shonna Chock, PA-C Surgical Short Stay/Anesthesiology [facility] Phone [phone] [facility] Phone [phone] 07/05/2023 1:17 PM

## 2023-07-06 ENCOUNTER — Ambulatory Visit (INDEPENDENT_AMBULATORY_CARE_PROVIDER_SITE_OTHER): Payer: Medicare Other | Admitting: Family Medicine

## 2023-07-06 ENCOUNTER — Encounter: Payer: Self-pay | Admitting: Neurology

## 2023-07-06 ENCOUNTER — Encounter (HOSPITAL_BASED_OUTPATIENT_CLINIC_OR_DEPARTMENT_OTHER): Payer: Self-pay | Admitting: *Deleted

## 2023-07-06 ENCOUNTER — Encounter (HOSPITAL_BASED_OUTPATIENT_CLINIC_OR_DEPARTMENT_OTHER): Payer: Self-pay | Admitting: Family Medicine

## 2023-07-06 VITALS — BP 135/76 | HR 77 | Ht 69.0 in | Wt 269.9 lb

## 2023-07-06 DIAGNOSIS — G629 Polyneuropathy, unspecified: Secondary | ICD-10-CM

## 2023-07-06 NOTE — Progress Notes (Signed)
    Procedures performed today:    None.  Independent interpretation of notes and tests performed by another provider:   None.  Brief History, Exam, Impression, and Recommendations:    BP 135/76 (BP Location: Right Arm, Patient Position: Sitting, Cuff Size: Normal)   Pulse 77   Ht 5\' 9"  (1.753 m)   Wt 269 lb 14.4 oz (122.4 kg)   SpO2 92%   BMI 39.86 kg/m   Neuropathy Assessment & Plan: Patient is currently taking gabapentin 300 mg capsule 3 times daily.  He is taking 1 tablet in the morning and at lunch with 2 capsules at night.  He reports that he continues to have symptoms at current dose.  He does have spinal decompression surgery scheduled for next week. We discussed options.  We will plan to proceed with dose titration of gabapentin with gradual increase to 2 capsules to be taken 3 times daily.  Given upcoming surgery, would be reasonable to hold off on making this dose titration until after surgery which patient will plan to do. We can also proceed with referral to neurology for further evaluation.  He has met with podiatrist in the past, however did not have a great experience with this.  Referral to neurology placed today.  We will plan to follow-up in about 6 to 8 weeks to monitor progress.  Orders: -     Ambulatory referral to Neurology  Return in about 2 months (around 09/03/2023) for neuropathy.   ___________________________________________ Tremel Setters de Peru, MD, ABFM, CAQSM Primary Care and Sports Medicine [facility]

## 2023-07-06 NOTE — Assessment & Plan Note (Addendum)
 Patient is currently taking gabapentin 300 mg capsule 3 times daily.  He is taking 1 tablet in the morning and at lunch with 2 capsules at night.  He reports that he continues to have symptoms at current dose.  He does have spinal decompression surgery scheduled for next week. We discussed options.  We will plan to proceed with dose titration of gabapentin with gradual increase to 2 capsules to be taken 3 times daily.  Given upcoming surgery, would be reasonable to hold off on making this dose titration until after surgery which patient will plan to do. We can also proceed with referral to neurology for further evaluation.  He has met with podiatrist in the past, however did not have a great experience with this.  Referral to neurology placed today.  We will plan to follow-up in about 6 to 8 weeks to monitor progress.

## 2023-07-06 NOTE — Patient Instructions (Signed)
  Medication Instructions:  Your physician recommends that you continue on your current medications as directed. Please refer to the Current Medication list given to you today. --If you need a refill on any your medications before your next appointment, please call your pharmacy first. If no refills are authorized on file call the office.--   Follow-Up: Your next appointment:   Your physician recommends that you schedule a follow-up appointment in: 2 month follow up  with Dr. de Peru  You will receive a text message or e-mail with a link to a survey about your care and experience with [place] today! We would greatly appreciate your feedback!   Thanks for letting us be apart of your health journey!!  Primary Care and Sports Medicine   Dr. Ceasar Mons Peru   We encourage you to activate your patient portal called "MyChart".  Sign up information is provided on this After Visit Summary.  MyChart is used to connect with patients for Virtual Visits (Telemedicine).  Patients are able to view lab/test results, encounter notes, upcoming appointments, etc.  Non-urgent messages can be sent to your provider as well. To learn more about what you can do with MyChart, please visit --  [email or web address].

## 2023-07-09 ENCOUNTER — Encounter (HOSPITAL_BASED_OUTPATIENT_CLINIC_OR_DEPARTMENT_OTHER): Payer: Self-pay

## 2023-07-09 ENCOUNTER — Ambulatory Visit (HOSPITAL_BASED_OUTPATIENT_CLINIC_OR_DEPARTMENT_OTHER): Payer: Medicare Other | Admitting: *Deleted

## 2023-07-09 DIAGNOSIS — Z0000 Encounter for general adult medical examination without abnormal findings: Secondary | ICD-10-CM | POA: Diagnosis not present

## 2023-07-09 NOTE — H&P (Signed)
 Orthopedic Spine Surgery H&P Note  Assessment: Patient is a 83 y.o. 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  I again explained that surgery is more predictable at relieving leg pain as opposed to back pain.   ___________________________________________________________________________  Chief Complaint: low back pain that radiates into bilateral buttocks and right lateral thigh  History: Patient is 83 y.o. male who has been previously seen in the office for ow back pain that radiates into bilateral buttock and right lateral thigh. He feels the pain when ambulating or standing and it resolves with flexion of the lumbar spine consistent with neurogenic claudication. 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 His symptoms failed to improve with conservative treatment so operative management was discussed at the last office visit. The patient presents today with no changes in his symptoms since the last office visit. See previous office note for further details.    Review of systems: General: denies fevers and chills, myalgias Neurologic: denies recent changes in vision, slurred speech Abdomen: denies nausea, vomiting, hematemesis Respiratory: denies cough, shortness of breath  Past medical history: DM (last A1c was 7.3 on 06/14/2023) HLD HTN   Allergies: doxycycline, percocet   Past surgical history:  Right carpal tunnel release Right  proximal humerus fracture ORIF Right total shoulder arthoplasty Neck surgery   Social history: Denies use of nicotine product (smoking, vaping, patches, smokeless) Alcohol use: yes, approximately 3 drinks per week Denies recreational drug use  Family history: -reviewed and not pertinent to lumbar stenosis with neurogenic claudication   Physical Exam:  General: no acute distress, appears stated age Neurologic: alert, answering questions appropriately, following commands Cardiovascular: regular rate, no cyanosis Respiratory: unlabored breathing on room air, symmetric chest rise Psychiatric: appropriate affect, normal cadence to speech   MSK (spine):  -Strength exam      Left  Right  EHL    5/5  5/5 TA    5/5  5/5 GSC    5/5  5/5 Knee extension  5/5  5/5 Knee flexion   5/5  5/5 Hip flexion   5/5  5/5  -Sensory exam    Sensation intact to light touch in L2-S1 nerve distributions of bilateral lower extremities  Patient name: Kenneth Henson Patient MRN: [id number] Date: 07/10/2023

## 2023-07-09 NOTE — Progress Notes (Signed)
 Subjective:   Kenneth Henson is a 83 y.o. male who presents for Medicare Annual/Subsequent preventive examination.  Visit Complete: Virtual I connected with  Kenneth Henson on 07/09/23 by a audio enabled telemedicine application and verified that I am speaking with the correct person using two identifiers.  Patient Location: Home  Provider Location: Office/Clinic  I discussed the limitations of evaluation and management by telemedicine. The patient expressed understanding and agreed to proceed.  Vital Signs: Because this visit was a virtual/telehealth visit, some criteria may be missing or patient reported. Any vitals not documented were not able to be obtained and vitals that have been documented are patient reported.  Patient Medicare AWV questionnaire was completed by the patient on 07/09/23; I have confirmed that all information answered by patient is correct and no changes since this date.        Objective:    There were no vitals filed for this visit. There is no height or weight on file to calculate BMI.     07/04/2023   10:25 AM 04/25/2023    6:45 AM 09/06/2022    5:23 PM 08/07/2022    2:30 AM 08/06/2022    1:39 PM 06/20/2022   11:45 AM 04/19/2022    1:10 PM  Advanced Directives  Does Patient Have a Medical Advance Directive? No No Yes Yes Yes No No  Type of Advance Directive   Living will Living will Living will    Does patient want to make changes to medical advance directive?   No - Patient declined No - Patient declined     Would patient like information on creating a medical advance directive? Yes (MAU/Ambulatory/Procedural Areas - Information given) No - Patient declined    No - Patient declined No - Patient declined    Current Medications (verified) Outpatient Encounter Medications as of 07/09/2023  Medication Sig   allopurinol (ZYLOPRIM) 300 MG tablet Take 300 mg by mouth every 12 (twelve) hours as needed (gout).   aspirin EC 81 MG tablet Take 81 mg by mouth  daily. Swallow whole.   diphenhydrAMINE HCl, Sleep, (ZZZQUIL) 50 MG/30ML LIQD Take 50 mg by mouth daily as needed (sleep).   gabapentin (NEURONTIN) 300 MG capsule Take 1 tab before bed x 2 weeks.  May increase to 1 tab at dinner for 2 weeks and then may increase to am, dinner and before bed thereafter for pain. (Patient taking differently: Take 300 mg by mouth in the morning, at noon, and at bedtime.)   lisinopril (PRINIVIL,ZESTRIL) 40 MG tablet Take 1 tablet (40 mg total) by mouth daily.   metFORMIN (GLUCOPHAGE) 850 MG tablet Take 850 mg by mouth daily. Takes it once in the morning, patient choice   No facility-administered encounter medications on file as of 07/09/2023.    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[facility])    Colon polyps    Diabetes mellitus type II    Erectile dysfunction    Hyperlipidemia    Hypertension    Neoplasm of skin    uncertain behavior   PTSD (post-traumatic stress disorder)    Sleep apnea    Stroke [facility])    Past Surgical History:  Procedure Laterality Date   CARPAL TUNNEL RELEASE     right   HAND  SURGERY     KNEE SURGERY     LIPOMA EXCISION Right 04/25/2023   Procedure: EXCISION LIPOMA POSTERIOR RIGHT NECK;  Surgeon: Lysle Rubens, MD;  Location: [facility] OR;  Service: General;  Laterality: Right;  LMA   MINOR HARDWARE REMOVAL Right 09/06/2022   Procedure: MINOR HARDWARE REMOVAL;  Surgeon: Bjorn Pippin, MD;  Location: [facility] ORS;  Service: Orthopedics;  Laterality: Right;   NECK SURGERY     ORIF HUMERUS FRACTURE Right 09/06/2022   Procedure: OPEN REDUCTION INTERNAL FIXATION (ORIF) PROXIMAL HUMERUS FRACTURE;  Surgeon: Bjorn Pippin, MD;  Location: [facility] ORS;  Service: Orthopedics;  Laterality: Right;   SHOULDER SURGERY Right    2020   SHOULDER SURGERY Left    2011    TOTAL SHOULDER REVISION Right 09/06/2022   Procedure: TOTAL SHOULDER REVISION;  Surgeon: Bjorn Pippin, MD;  Location: [facility] ORS;  Service: Orthopedics;  Laterality: Right;   Family History  Problem Relation Age of Onset   Hypertension Mother    Heart disease Mother    Stroke Father    Colon cancer Neg Hx    Stomach cancer Neg Hx    Breast cancer Sister    Colon polyps Sister    Social History   Socioeconomic History   Marital status: Single    Spouse name: Not on file   Number of children: 4   Years of education: Not on file   Highest education level: Not on file  Occupational History   Not on file  Tobacco Use   Smoking status: Former    Current packs/day: 0.00    Types: Cigarettes    Quit date: 05/15/2009    Years since quitting: 14.1    Passive exposure: Past   Smokeless tobacco: Never  Vaping Use   Vaping status: Never Used  Substance and Sexual Activity   Alcohol use: Yes    Alcohol/week: 1.0 standard drink of alcohol    Types: 1 Shots of liquor per week    Comment: vodka, weekly   Drug use: No   Sexual activity: Not on file  Other Topics Concern   Not on file  Social History Narrative   Not on file   Social Drivers of Health   Financial Resource Strain: Low Risk  (06/20/2022)   Overall Financial Resource Strain (CARDIA)    Difficulty of Paying Living Expenses: Not hard at all  Food Insecurity: No Food Insecurity (09/08/2022)   Hunger Vital Sign    Worried About Running Out of Food in the Last Year: Never true    Ran Out of Food in the Last Year: Never true  Transportation Needs: No Transportation Needs (09/08/2022)   PRAPARE - Administrator, Civil Service (Medical): No    Lack of Transportation (Non-Medical): No  Physical Activity: Inactive (06/20/2022)   Exercise Vital Sign    Days of Exercise per Week: 0 days    Minutes of Exercise per Session: 0 min  Stress: No Stress Concern Present (06/20/2022)   [employer] of Occupational Health -  Occupational Stress Questionnaire    Feeling of Stress : Not at all  Social Connections: Moderately Isolated (02/08/2023)   Social Connection and Isolation Panel [NHANES]    Frequency of Communication with Friends and Family: More than three times a week    Frequency of Social Gatherings with Friends and Family: More than three times a week    Attends Religious Services: Never    Database administrator or  Organizations: Yes    Attends Engineer, structural: More than 4 times per year    Marital Status: Divorced    Tobacco Counseling Counseling given: Not Answered   Clinical Intake:                        Activities of Daily Living    07/04/2023   10:29 AM 07/04/2023   10:25 AM  In your present state of health, do you have any difficulty performing the following activities:  Hearing?  0  Vision?  1  Comment  pt is legally blind  Difficulty concentrating or making decisions?  0  Doing errands, shopping? 0     Patient Care Team: [place], Buren Kos, MD as PCP - General (Family Medicine)  Indicate any recent Medical Services you may have received from other than [facility] providers in the past year (date may be approximate).     Assessment:   This is a routine wellness examination for [place].  Hearing/Vision screen No results found.   Goals Addressed   None   Depression Screen    07/06/2023    9:44 AM 06/14/2023   10:38 AM 05/24/2023    9:25 AM 04/02/2023    1:43 PM 02/27/2023    2:26 PM 02/08/2023    2:32 PM 12/28/2022    2:00 PM  PHQ 2/9 Scores  PHQ - 2 Score 0 0 0 0 0 1 0  PHQ- 9 Score 0 0 0 0  7     Fall Risk    07/06/2023    9:43 AM 06/14/2023   10:37 AM 05/24/2023    9:24 AM 04/02/2023    1:42 PM 02/27/2023    2:26 PM  Fall Risk   Falls in the past year? 0 1 1 1 1   Number falls in past yr: 0 0 0 1 0  Injury with Fall? 0 1 1 1 1   Risk for fall due to : No Fall Risks History of fall(s);Impaired balance/gait History of fall(s);Impaired  balance/gait;Impaired mobility History of fall(s);Impaired balance/gait;Impaired mobility   Follow up Falls evaluation completed Education provided;Falls prevention discussed Falls evaluation completed;Education provided;Falls prevention discussed Falls evaluation completed;Education provided;Falls prevention discussed     MEDICARE RISK AT HOME:    TIMED UP AND GO:  Was the test performed?  No    Cognitive Function:        06/20/2022   11:46 AM  6CIT Screen  What Year? 0 points  What month? 0 points  What time? 0 points  Count back from 20 0 points  Months in reverse 0 points  Repeat phrase 0 points  Total Score 0 points    Immunizations Immunization History  Administered Date(s) Administered   Fluad Quad(high Dose 65+) 03/19/2020, 03/01/2021   Fluad Trivalent(High Dose 65+) 03/14/2018, 02/08/2023   Influenza Inj Mdck Quad Pf 03/23/2017   Influenza Split 02/24/2011   Influenza-Unspecified 02/13/2011, 06/23/2013, 04/08/2014, 02/13/2016, 03/14/2018, 03/01/2021   Moderna Covid-19 Fall Seasonal Vaccine 49yrs & older 06/06/2023   Moderna Covid-19 Vaccine Bivalent Booster 49yrs & up 03/02/2021   Moderna Sars-Covid-2 Vaccination 06/06/2019, 07/03/2019, 05/19/2020, 07/02/2020   PNEUMOCOCCAL CONJUGATE-20 06/02/2021   Pneumococcal Conjugate-13 04/08/2014   Pneumococcal Polysaccharide-23 08/27/2009, 06/20/2018   Pneumococcal-Unspecified 08/27/2009   Rsv, Bivalent, Protein Subunit Rsvpref,pf Verdis Frederickson) 06/06/2023   Td 08/24/2009   Tdap 08/24/2009, 03/14/2018   Tetanus 09/22/2010, 09/22/2010   Zoster Recombinant(Shingrix) 11/30/2021, 01/31/2022   Zoster, Live 08/23/2009, 08/24/2009  TDAP status: Up to date  Flu Vaccine status: Up to date  Pneumococcal vaccine status: Up to date  Covid-19 vaccine status: Completed vaccines  Qualifies for Shingles Vaccine? Yes   Zostavax completed Yes   Shingrix Completed?: Yes  Screening Tests Health Maintenance  Topic Date Due    OPHTHALMOLOGY EXAM  07/07/2021   COVID-19 Vaccine (7 - 2024-25 season) 08/01/2023   HEMOGLOBIN A1C  12/12/2023   Diabetic kidney evaluation - Urine ACR  04/01/2024   FOOT EXAM  04/01/2024   Diabetic kidney evaluation - eGFR measurement  07/03/2024   Medicare Annual Wellness (AWV)  07/08/2024   DTaP/Tdap/Td (6 - Td or Tdap) 03/14/2028   Pneumonia Vaccine 10+ Years old  Completed   INFLUENZA VACCINE  Completed   Zoster Vaccines- Shingrix  Completed   HPV VACCINES  Aged Out   Colonoscopy  Discontinued    Health Maintenance  Health Maintenance Due  Topic Date Due   OPHTHALMOLOGY EXAM  07/07/2021    Colorectal cancer screening: No longer required.   Lung Cancer Screening: (Low Dose CT Chest recommended if Age 31-80 years, 20 pack-year currently smoking OR have quit w/in 15years.) does not qualify.   Lung Cancer Screening Referral: NA    Vision Screening: Recommended annual ophthalmology exams for early detection of glaucoma and other disorders of the eye. Is the patient up to date with their annual eye exam?  Yes  Who is the provider or what is the name of the office in which the patient attends annual eye exams? [facility] If pt is not established with a provider, would they like to be referred to a provider to establish care? No .   Dental Screening: Recommended annual dental exams for proper oral hygiene  Community Resource Referral / Chronic Care Management: CRR required this visit?  No   CCM required this visit?  No     Plan:     I have personally reviewed and noted the following in the patient's chart:   Medical and social history Use of alcohol, tobacco or illicit drugs  Current medications and supplements including opioid prescriptions. Patient is not currently taking opioid prescriptions. Functional ability and status Nutritional status Physical activity Advanced directives List of other physicians Hospitalizations, surgeries, and ER visits in  previous 12 months Vitals Screenings to include cognitive, depression, and falls Referrals and appointments  In addition, I have reviewed and discussed with patient certain preventive protocols, quality metrics, and best practice recommendations. A written personalized care plan for preventive services as well as general preventive health recommendations were provided to patient.     Park Breed, CMA   07/09/2023   After Visit Summary: (MyChart) Due to this being a telephonic visit, the after visit summary with patients personalized plan was offered to patient via MyChart   Nurse Notes:  Mr. Espina , Thank you for taking time to come for your Medicare Wellness Visit. I appreciate your ongoing commitment to your health goals. Please review the following plan we discussed and let me know if I can assist you in the future.   These are the goals we discussed:  Goals       Patient Stated      Would like to walk better       Stay Healthy (pt-stated)        This is a list of the screening recommended for you and due dates:  Health Maintenance  Topic Date Due   Eye exam for  diabetics  07/07/2021   COVID-19 Vaccine (7 - 2024-25 season) 08/01/2023   Hemoglobin A1C  12/12/2023   Yearly kidney health urinalysis for diabetes  04/01/2024   Complete foot exam   04/01/2024   Yearly kidney function blood test for diabetes  07/03/2024   Medicare Annual Wellness Visit  07/08/2024   DTaP/Tdap/Td vaccine (6 - Td or Tdap) 03/14/2028   Pneumonia Vaccine  Completed   Flu Shot  Completed   Zoster (Shingles) Vaccine  Completed   HPV Vaccine  Aged Out   Colon Cancer Screening  Discontinued

## 2023-07-09 NOTE — Patient Instructions (Signed)
 [name] , Thank you for taking time to come for your Medicare Wellness Visit. I appreciate your ongoing commitment to your health goals. Please review the following plan we discussed and let me know if I can assist you in the future.   Screening recommendations/referrals:  Recommended yearly ophthalmology/optometry visit for glaucoma screening and checkup Recommended yearly dental visit for hygiene and checkup  Vaccinations: Influenza vaccine: completed Pneumococcal vaccine: completed Tdap vaccine: completed Shingles vaccine: completed    Advanced directives: information provided  Conditions/risks identified: falls  Next appointment: 1 year   Preventive Care 84 Years and Older, Male Preventive care refers to lifestyle choices and visits with your health care provider that can promote health and wellness. What does preventive care include? A yearly physical exam. This is also called an annual well check. Dental exams once or twice a year. Routine eye exams. Ask your health care provider how often you should have your eyes checked. Personal lifestyle choices, including: Daily care of your teeth and gums. Regular physical activity. Eating a healthy diet. Avoiding tobacco and drug use. Limiting alcohol use. Practicing safe sex. Taking low doses of aspirin every day. Taking vitamin and mineral supplements as recommended by your health care provider. What happens during an annual well check? The services and screenings done by your health care provider during your annual well check will depend on your age, overall health, lifestyle risk factors, and family history of disease. Counseling  Your health care provider may ask you questions about your: Alcohol use. Tobacco use. Drug use. Emotional well-being. Home and relationship well-being. Sexual activity. Eating habits. History of falls. Memory and ability to understand (cognition). Work and work Astronomer. Screening   You may have the following tests or measurements: Height, weight, and BMI. Blood pressure. Lipid and cholesterol levels. These may be checked every 5 years, or more frequently if you are over 84 years old. Skin check. Lung cancer screening. You may have this screening every year starting at age 84 if you have a 30-pack-year history of smoking and currently smoke or have quit within the past 15 years. Fecal occult blood test (FOBT) of the stool. You may have this test every year starting at age 84. Flexible sigmoidoscopy or colonoscopy. You may have a sigmoidoscopy every 5 years or a colonoscopy every 10 years starting at age 84. Prostate cancer screening. Recommendations will vary depending on your family history and other risks. Hepatitis C blood test. Hepatitis B blood test. Sexually transmitted disease (STD) testing. Diabetes screening. This is done by checking your blood sugar (glucose) after you have not eaten for a while (fasting). You may have this done every 1-3 years. Abdominal aortic aneurysm (AAA) screening. You may need this if you are a current or former smoker. Osteoporosis. You may be screened starting at age 70 if you are at high risk. Talk with your health care provider about your test results, treatment options, and if necessary, the need for more tests. Vaccines  Your health care provider may recommend certain vaccines, such as: Influenza vaccine. This is recommended every year. Tetanus, diphtheria, and acellular pertussis (Tdap, Td) vaccine. You may need a Td booster every 10 years. Zoster vaccine. You may need this after age 84. Pneumococcal 13-valent conjugate (PCV13) vaccine. One dose is recommended after age 84. Pneumococcal polysaccharide (PPSV23) vaccine. One dose is recommended after age 84. Talk to your health care provider about which screenings and vaccines you need and how often you need them. This information is  not intended to replace advice given to you by  your health care provider. Make sure you discuss any questions you have with your health care provider. Document Released: 05/28/2015 Document Revised: 01/19/2016 Document Reviewed: 03/02/2015 Elsevier Interactive Patient Education  2017 [employer].  Fall Prevention in the Home Falls can cause injuries. They can happen to people of all ages. There are many things you can do to make your home safe and to help prevent falls. What can I do on the outside of my home? Regularly fix the edges of walkways and driveways and fix any cracks. Remove anything that might make you trip as you walk through a door, such as a raised step or threshold. Trim any bushes or trees on the path to your home. Use bright outdoor lighting. Clear any walking paths of anything that might make someone trip, such as rocks or tools. Regularly check to see if handrails are loose or broken. Make sure that both sides of any steps have handrails. Any raised decks and porches should have guardrails on the edges. Have any leaves, snow, or ice cleared regularly. Use sand or salt on walking paths during winter. Clean up any spills in your garage right away. This includes oil or grease spills. What can I do in the bathroom? Use night lights. Install grab bars by the toilet and in the tub and shower. Do not use towel bars as grab bars. Use non-skid mats or decals in the tub or shower. If you need to sit down in the shower, use a plastic, non-slip stool. Keep the floor dry. Clean up any water that spills on the floor as soon as it happens. Remove soap buildup in the tub or shower regularly. Attach bath mats securely with double-sided non-slip rug tape. Do not have throw rugs and other things on the floor that can make you trip. What can I do in the bedroom? Use night lights. Make sure that you have a light by your bed that is easy to reach. Do not use any sheets or blankets that are too big for your bed. They should not hang  down onto the floor. Have a firm chair that has side arms. You can use this for support while you get dressed. Do not have throw rugs and other things on the floor that can make you trip. What can I do in the kitchen? Clean up any spills right away. Avoid walking on wet floors. Keep items that you use a lot in easy-to-reach places. If you need to reach something above you, use a strong step stool that has a grab bar. Keep electrical cords out of the way. Do not use floor polish or wax that makes floors slippery. If you must use wax, use non-skid floor wax. Do not have throw rugs and other things on the floor that can make you trip. What can I do with my stairs? Do not leave any items on the stairs. Make sure that there are handrails on both sides of the stairs and use them. Fix handrails that are broken or loose. Make sure that handrails are as long as the stairways. Check any carpeting to make sure that it is firmly attached to the stairs. Fix any carpet that is loose or worn. Avoid having throw rugs at the top or bottom of the stairs. If you do have throw rugs, attach them to the floor with carpet tape. Make sure that you have a light switch at the top of the  stairs and the bottom of the stairs. If you do not have them, ask someone to add them for you. What else can I do to help prevent falls? Wear shoes that: Do not have high heels. Have rubber bottoms. Are comfortable and fit you well. Are closed at the toe. Do not wear sandals. If you use a stepladder: Make sure that it is fully opened. Do not climb a closed stepladder. Make sure that both sides of the stepladder are locked into place. Ask someone to hold it for you, if possible. Clearly mark and make sure that you can see: Any grab bars or handrails. First and last steps. Where the edge of each step is. Use tools that help you move around (mobility aids) if they are needed. These  include: Canes. Walkers. Scooters. Crutches. Turn on the lights when you go into a dark area. Replace any light bulbs as soon as they burn out. Set up your furniture so you have a clear path. Avoid moving your furniture around. If any of your floors are uneven, fix them. If there are any pets around you, be aware of where they are. Review your medicines with your doctor. Some medicines can make you feel dizzy. This can increase your chance of falling. Ask your doctor what other things that you can do to help prevent falls. This information is not intended to replace advice given to you by your health care provider. Make sure you discuss any questions you have with your health care provider. Document Released: 02/25/2009 Document Revised: 10/07/2015 Document Reviewed: 06/05/2014 Elsevier Interactive Patient Education  2017 [employer].

## 2023-07-10 ENCOUNTER — Observation Stay (HOSPITAL_COMMUNITY)
Admission: RE | Admit: 2023-07-10 | Discharge: 2023-07-11 | Disposition: A | Discharge status: Home / Self Care | Payer: Medicare Other | Attending: Orthopedic Surgery | Admitting: Orthopedic Surgery

## 2023-07-10 ENCOUNTER — Encounter (HOSPITAL_COMMUNITY): Payer: Self-pay | Admitting: Orthopedic Surgery

## 2023-07-10 ENCOUNTER — Encounter (HOSPITAL_COMMUNITY)
Admission: RE | Disposition: A | Discharge status: Home / Self Care | Payer: Self-pay | Source: Home / Self Care | Attending: Orthopedic Surgery

## 2023-07-10 ENCOUNTER — Ambulatory Visit (HOSPITAL_COMMUNITY): Payer: Medicare Other

## 2023-07-10 ENCOUNTER — Encounter (HOSPITAL_BASED_OUTPATIENT_CLINIC_OR_DEPARTMENT_OTHER): Payer: TRICARE For Life (TFL)

## 2023-07-10 ENCOUNTER — Ambulatory Visit (HOSPITAL_BASED_OUTPATIENT_CLINIC_OR_DEPARTMENT_OTHER): Payer: Self-pay

## 2023-07-10 ENCOUNTER — Ambulatory Visit (HOSPITAL_COMMUNITY): Payer: Medicare Other | Admitting: Vascular Surgery

## 2023-07-10 ENCOUNTER — Other Ambulatory Visit: Payer: Self-pay

## 2023-07-10 ENCOUNTER — Other Ambulatory Visit (HOSPITAL_COMMUNITY): Payer: Self-pay

## 2023-07-10 DIAGNOSIS — I10 Essential (primary) hypertension: Secondary | ICD-10-CM | POA: Diagnosis not present

## 2023-07-10 DIAGNOSIS — M48062 Spinal stenosis, lumbar region with neurogenic claudication: Principal | ICD-10-CM | POA: Diagnosis present

## 2023-07-10 DIAGNOSIS — F418 Other specified anxiety disorders: Secondary | ICD-10-CM | POA: Diagnosis not present

## 2023-07-10 DIAGNOSIS — G4733 Obstructive sleep apnea (adult) (pediatric): Secondary | ICD-10-CM | POA: Diagnosis not present

## 2023-07-10 DIAGNOSIS — E119 Type 2 diabetes mellitus without complications: Secondary | ICD-10-CM | POA: Diagnosis not present

## 2023-07-10 DIAGNOSIS — Z01818 Encounter for other preprocedural examination: Principal | ICD-10-CM

## 2023-07-10 HISTORY — PX: DECOMPRESSIVE LUMBAR LAMINECTOMY LEVEL 1: SHX5791

## 2023-07-10 LAB — GLUCOSE, CAPILLARY
Glucose-Capillary: 168 mg/dL — ABNORMAL HIGH (ref 70–99)
Glucose-Capillary: 145 mg/dL — ABNORMAL HIGH (ref 70–99)
Glucose-Capillary: 237 mg/dL — ABNORMAL HIGH (ref 70–99)
Glucose-Capillary: 179 mg/dL — ABNORMAL HIGH (ref 70–99)

## 2023-07-10 SURGERY — DECOMPRESSIVE LUMBAR LAMINECTOMY LEVEL 1
Anesthesia: General | Site: Spine Lumbar

## 2023-07-10 MED ORDER — ADULT MULTIVITAMIN W/MINERALS CH: 1.0000 | ORAL_TABLET | Freq: Every day | ORAL | Status: DC

## 2023-07-10 MED ORDER — ACETAMINOPHEN 500 MG PO TABS
1000.0000 mg | ORAL_TABLET | Freq: Once | ORAL | Status: AC
  Administered 2023-07-10: 1000 mg via ORAL
  Filled 2023-07-10: qty 2

## 2023-07-10 MED ORDER — CEFAZOLIN SODIUM-DEXTROSE 2-4 GM/100ML-% IV SOLN
2.0000 g | Freq: Four times a day (QID) | INTRAVENOUS | Status: AC
  Administered 2023-07-10 (×2): 2 g via INTRAVENOUS
  Filled 2023-07-10 (×2): qty 100

## 2023-07-10 MED ORDER — POLYETHYLENE GLYCOL 3350 17 GM/SCOOP PO POWD
17.0000 g | Freq: Every day | ORAL | 0 refills | Status: AC
  Filled 2023-07-10: qty 238, 14d supply, fill #0

## 2023-07-10 MED ORDER — INSULIN ASPART 100 UNIT/ML IJ SOLN: 0.0000 [IU] | Freq: Every day | INTRAMUSCULAR | Status: DC

## 2023-07-10 MED ORDER — MUPIROCIN 2 % EX OINT: 1.0000 | TOPICAL_OINTMENT | Freq: Two times a day (BID) | CUTANEOUS | 0 refills | Status: AC

## 2023-07-10 MED ORDER — THROMBIN 20000 UNITS EX SOLR
CUTANEOUS | Status: AC
  Filled 2023-07-10: qty 20000

## 2023-07-10 MED ORDER — METHOCARBAMOL 500 MG PO TABS
500.0000 mg | ORAL_TABLET | Freq: Four times a day (QID) | ORAL | 0 refills | Status: DC
  Filled 2023-07-10: qty 40, 10d supply, fill #0

## 2023-07-10 MED ORDER — LIDOCAINE 2% (20 MG/ML) 5 ML SYRINGE
INTRAMUSCULAR | Status: DC | PRN
  Administered 2023-07-10: 100 mg via INTRAVENOUS

## 2023-07-10 MED ORDER — SODIUM CHLORIDE 0.9 % IV SOLN: INTRAVENOUS | Status: DC | PRN

## 2023-07-10 MED ORDER — VANCOMYCIN HCL 1500 MG/300ML IV SOLN
1500.0000 mg | INTRAVENOUS | Status: AC
  Administered 2023-07-10: 1500 mg via INTRAVENOUS
  Filled 2023-07-10: qty 300

## 2023-07-10 MED ORDER — LIDOCAINE 2% (20 MG/ML) 5 ML SYRINGE
INTRAMUSCULAR | Status: AC
  Filled 2023-07-10: qty 5

## 2023-07-10 MED ORDER — TRANEXAMIC ACID-NACL 1000-0.7 MG/100ML-% IV SOLN
1000.0000 mg | INTRAVENOUS | Status: AC
  Administered 2023-07-10: 1000 mg via INTRAVENOUS
  Filled 2023-07-10: qty 100

## 2023-07-10 MED ORDER — OXYCODONE HCL 5 MG PO TABS: 5.0000 mg | ORAL_TABLET | Freq: Once | ORAL | Status: DC | PRN

## 2023-07-10 MED ORDER — SUGAMMADEX SODIUM 200 MG/2ML IV SOLN
INTRAVENOUS | Status: DC | PRN
Start: 2023-07-10 — End: 2023-07-10
  Administered 2023-07-10: 300 mg via INTRAVENOUS

## 2023-07-10 MED ORDER — PROPOFOL 10 MG/ML IV BOLUS
INTRAVENOUS | Status: AC
  Filled 2023-07-10: qty 20

## 2023-07-10 MED ORDER — DEXAMETHASONE SODIUM PHOSPHATE 10 MG/ML IJ SOLN
10.0000 mg | Freq: Once | INTRAMUSCULAR | Status: DC
  Filled 2023-07-10: qty 1

## 2023-07-10 MED ORDER — ONDANSETRON HCL 4 MG/2ML IJ SOLN
INTRAMUSCULAR | Status: AC
  Filled 2023-07-10: qty 2

## 2023-07-10 MED ORDER — POVIDONE-IODINE 10 % EX SWAB
2.0000 | Freq: Once | CUTANEOUS | Status: AC
  Administered 2023-07-10: 2 via TOPICAL

## 2023-07-10 MED ORDER — VANCOMYCIN HCL 1000 MG IV SOLR
INTRAVENOUS | Status: AC
  Filled 2023-07-10: qty 20

## 2023-07-10 MED ORDER — OXYCODONE HCL 5 MG PO TABS
5.0000 mg | ORAL_TABLET | ORAL | Status: DC | PRN
  Filled 2023-07-10: qty 2

## 2023-07-10 MED ORDER — PROPOFOL 10 MG/ML IV BOLUS
INTRAVENOUS | Status: DC | PRN
  Administered 2023-07-10: 150 mg via INTRAVENOUS

## 2023-07-10 MED ORDER — DEXAMETHASONE SODIUM PHOSPHATE 10 MG/ML IJ SOLN
INTRAMUSCULAR | Status: DC | PRN
  Administered 2023-07-10: 8 mg via INTRAVENOUS

## 2023-07-10 MED ORDER — METFORMIN HCL 850 MG PO TABS
850.0000 mg | ORAL_TABLET | Freq: Every day | ORAL | Status: DC
  Administered 2023-07-11: 850 mg via ORAL
  Filled 2023-07-10: qty 1

## 2023-07-10 MED ORDER — SENNA 8.6 MG PO TABS
1.0000 | ORAL_TABLET | Freq: Two times a day (BID) | ORAL | Status: DC
  Administered 2023-07-10: 8.6 mg via ORAL
  Filled 2023-07-10: qty 1

## 2023-07-10 MED ORDER — LISINOPRIL 20 MG PO TABS: 40.0000 mg | ORAL_TABLET | Freq: Every day | ORAL | Status: DC

## 2023-07-10 MED ORDER — ONDANSETRON HCL 4 MG/2ML IJ SOLN
INTRAMUSCULAR | Status: DC | PRN
  Administered 2023-07-10: 4 mg via INTRAVENOUS

## 2023-07-10 MED ORDER — DEXAMETHASONE SODIUM PHOSPHATE 10 MG/ML IJ SOLN
INTRAMUSCULAR | Status: AC
  Filled 2023-07-10: qty 1

## 2023-07-10 MED ORDER — PHENYLEPHRINE 80 MCG/ML (10ML) SYRINGE FOR IV PUSH (FOR BLOOD PRESSURE SUPPORT)
PREFILLED_SYRINGE | INTRAVENOUS | Status: DC | PRN
  Administered 2023-07-10 (×3): 160 ug via INTRAVENOUS
  Administered 2023-07-10: 80 ug via INTRAVENOUS

## 2023-07-10 MED ORDER — SENNA 8.6 MG PO TABS
1.0000 | ORAL_TABLET | Freq: Two times a day (BID) | ORAL | 0 refills | Status: AC
  Filled 2023-07-10: qty 28, 14d supply, fill #0

## 2023-07-10 MED ORDER — OXYCODONE HCL 5 MG/5ML PO SOLN: 5.0000 mg | Freq: Once | ORAL | Status: DC | PRN

## 2023-07-10 MED ORDER — ACETAMINOPHEN 10 MG/ML IV SOLN
INTRAVENOUS | Status: AC
  Filled 2023-07-10: qty 100

## 2023-07-10 MED ORDER — MELATONIN 3 MG PO TABS
3.0000 mg | ORAL_TABLET | Freq: Every evening | ORAL | Status: DC | PRN
  Administered 2023-07-10: 3 mg via ORAL
  Filled 2023-07-10: qty 1

## 2023-07-10 MED ORDER — METHOCARBAMOL 500 MG PO TABS
500.0000 mg | ORAL_TABLET | Freq: Four times a day (QID) | ORAL | Status: DC
  Administered 2023-07-10 – 2023-07-11 (×3): 500 mg via ORAL
  Filled 2023-07-10 (×3): qty 1

## 2023-07-10 MED ORDER — FENTANYL CITRATE (PF) 250 MCG/5ML IJ SOLN
INTRAMUSCULAR | Status: AC
  Filled 2023-07-10: qty 5

## 2023-07-10 MED ORDER — ROCURONIUM BROMIDE 10 MG/ML (PF) SYRINGE
PREFILLED_SYRINGE | INTRAVENOUS | Status: AC
  Filled 2023-07-10: qty 10

## 2023-07-10 MED ORDER — THROMBIN 20000 UNITS EX SOLR
CUTANEOUS | Status: DC | PRN
  Administered 2023-07-10: 20 mL via TOPICAL

## 2023-07-10 MED ORDER — INSULIN ASPART 100 UNIT/ML IJ SOLN
0.0000 [IU] | Freq: Three times a day (TID) | INTRAMUSCULAR | Status: DC
  Administered 2023-07-10: 5 [IU] via SUBCUTANEOUS
  Administered 2023-07-11: 2 [IU] via SUBCUTANEOUS

## 2023-07-10 MED ORDER — CHLORHEXIDINE GLUCONATE 4 % EX SOLN: 1.0000 | CUTANEOUS | 1 refills | Status: DC

## 2023-07-10 MED ORDER — DROPERIDOL 2.5 MG/ML IJ SOLN: 0.6250 mg | Freq: Once | INTRAMUSCULAR | Status: DC | PRN

## 2023-07-10 MED ORDER — CEFAZOLIN SODIUM-DEXTROSE 3-4 GM/150ML-% IV SOLN
3.0000 g | INTRAVENOUS | Status: AC
Start: 2023-07-10 — End: 2023-07-10
  Administered 2023-07-10: 3 g via INTRAVENOUS
  Filled 2023-07-10: qty 150

## 2023-07-10 MED ORDER — TRANEXAMIC ACID-NACL 1000-0.7 MG/100ML-% IV SOLN
1000.0000 mg | Freq: Once | INTRAVENOUS | Status: AC
  Administered 2023-07-10: 1000 mg via INTRAVENOUS
  Filled 2023-07-10: qty 100

## 2023-07-10 MED ORDER — HYDROMORPHONE HCL 1 MG/ML IJ SOLN: 0.5000 mg | INTRAMUSCULAR | Status: AC | PRN

## 2023-07-10 MED ORDER — PHENYLEPHRINE 80 MCG/ML (10ML) SYRINGE FOR IV PUSH (FOR BLOOD PRESSURE SUPPORT)
PREFILLED_SYRINGE | INTRAVENOUS | Status: AC
  Filled 2023-07-10: qty 10

## 2023-07-10 MED ORDER — PHENYLEPHRINE HCL-NACL 20-0.9 MG/250ML-% IV SOLN
INTRAVENOUS | Status: DC | PRN
  Administered 2023-07-10: 35 ug/min via INTRAVENOUS

## 2023-07-10 MED ORDER — HYDROCODONE-ACETAMINOPHEN 5-325 MG PO TABS
1.0000 | ORAL_TABLET | ORAL | Status: DC | PRN
  Administered 2023-07-10 – 2023-07-11 (×3): 2 via ORAL
  Administered 2023-07-11: 1 via ORAL
  Administered 2023-07-11: 2 via ORAL
  Filled 2023-07-10: qty 1
  Filled 2023-07-10 (×4): qty 2

## 2023-07-10 MED ORDER — TRAZODONE HCL 50 MG PO TABS
50.0000 mg | ORAL_TABLET | Freq: Every day | ORAL | Status: DC
  Administered 2023-07-10: 50 mg via ORAL
  Filled 2023-07-10: qty 1

## 2023-07-10 MED ORDER — FENTANYL CITRATE (PF) 100 MCG/2ML IJ SOLN
INTRAMUSCULAR | Status: AC
  Filled 2023-07-10: qty 2

## 2023-07-10 MED ORDER — ACETAMINOPHEN 10 MG/ML IV SOLN
1000.0000 mg | Freq: Once | INTRAVENOUS | Status: DC | PRN
  Administered 2023-07-10: 1000 mg via INTRAVENOUS

## 2023-07-10 MED ORDER — SUGAMMADEX SODIUM 200 MG/2ML IV SOLN
INTRAVENOUS | Status: AC
  Filled 2023-07-10: qty 4

## 2023-07-10 MED ORDER — LACTATED RINGERS IV SOLN: INTRAVENOUS | Status: DC | PRN

## 2023-07-10 MED ORDER — POLYETHYLENE GLYCOL 3350 17 G PO PACK: 17.0000 g | PACK | Freq: Every day | ORAL | Status: DC

## 2023-07-10 MED ORDER — ACETAMINOPHEN 500 MG PO TABS
1000.0000 mg | ORAL_TABLET | Freq: Three times a day (TID) | ORAL | Status: DC
  Administered 2023-07-10: 1000 mg via ORAL
  Filled 2023-07-10: qty 2

## 2023-07-10 MED ORDER — FENTANYL CITRATE (PF) 250 MCG/5ML IJ SOLN
INTRAMUSCULAR | Status: DC | PRN
  Administered 2023-07-10 (×2): 25 ug via INTRAVENOUS
  Administered 2023-07-10: 50 ug via INTRAVENOUS
  Administered 2023-07-10: 150 ug via INTRAVENOUS

## 2023-07-10 MED ORDER — ONDANSETRON HCL 4 MG PO TABS
4.0000 mg | ORAL_TABLET | Freq: Four times a day (QID) | ORAL | Status: DC | PRN
Start: 2023-07-10 — End: 2023-07-11

## 2023-07-10 MED ORDER — AMLODIPINE BESYLATE 5 MG PO TABS
5.0000 mg | ORAL_TABLET | Freq: Every day | ORAL | Status: DC
Start: 2023-07-11 — End: 2023-07-11

## 2023-07-10 MED ORDER — HYDROCODONE-ACETAMINOPHEN 5-325 MG PO TABS
1.0000 | ORAL_TABLET | ORAL | 0 refills | Status: AC | PRN
  Filled 2023-07-10: qty 40, 4d supply, fill #0

## 2023-07-10 MED ORDER — ROCURONIUM BROMIDE 10 MG/ML (PF) SYRINGE
PREFILLED_SYRINGE | INTRAVENOUS | Status: DC | PRN
  Administered 2023-07-10: 70 mg via INTRAVENOUS
  Administered 2023-07-10: 10 mg via INTRAVENOUS

## 2023-07-10 MED ORDER — VANCOMYCIN HCL 1000 MG IV SOLR
INTRAVENOUS | Status: DC | PRN
  Administered 2023-07-10: 1000 mg

## 2023-07-10 MED ORDER — FENTANYL CITRATE (PF) 100 MCG/2ML IJ SOLN
25.0000 ug | INTRAMUSCULAR | Status: DC | PRN
  Administered 2023-07-10 (×3): 50 ug via INTRAVENOUS

## 2023-07-10 MED ORDER — ONDANSETRON HCL 4 MG/2ML IJ SOLN: 4.0000 mg | Freq: Four times a day (QID) | INTRAMUSCULAR | Status: DC | PRN

## 2023-07-10 MED ORDER — DIPHENHYDRAMINE HCL 25 MG PO CAPS: 25.0000 mg | ORAL_CAPSULE | Freq: Two times a day (BID) | ORAL | Status: DC | PRN

## 2023-07-10 MED ORDER — GABAPENTIN 300 MG PO CAPS
300.0000 mg | ORAL_CAPSULE | Freq: Three times a day (TID) | ORAL | Status: DC
  Administered 2023-07-10 (×2): 300 mg via ORAL
  Filled 2023-07-10 (×2): qty 1

## 2023-07-10 MED ORDER — 0.9 % SODIUM CHLORIDE (POUR BTL) OPTIME
TOPICAL | Status: DC | PRN
  Administered 2023-07-10: 1000 mL

## 2023-07-10 MED ORDER — CHLORHEXIDINE GLUCONATE 0.12 % MT SOLN
OROMUCOSAL | Status: AC
  Administered 2023-07-10: 15 mL
  Filled 2023-07-10: qty 15

## 2023-07-10 SURGICAL SUPPLY — 39 items
BENZOIN TINCTURE AMPULE (MISCELLANEOUS) ×1 IMPLANT
BENZOIN TINCTURE PRP APPL 2/3 (GAUZE/BANDAGES/DRESSINGS) ×1 IMPLANT
BUR MATCHSTICK NEURO 3.0 LAGG (BURR) ×1 IMPLANT
CANISTER SUCT 3000ML PPV (MISCELLANEOUS) ×1 IMPLANT
COVER MAYO STAND STRL (DRAPES) ×3 IMPLANT
COVER SURGICAL LIGHT HANDLE (MISCELLANEOUS) ×1 IMPLANT
DRAIN HEMOVAC 7FR (DRAIN) IMPLANT
DRAPE C-ARM 42X72 X-RAY (DRAPES) ×1 IMPLANT
DRAPE UTILITY XL STRL (DRAPES) ×2 IMPLANT
DRESSING MEPILEX FLEX 4X4 (GAUZE/BANDAGES/DRESSINGS) ×1 IMPLANT
DRSG MEPILEX FLEX 4X4 (GAUZE/BANDAGES/DRESSINGS) ×1 IMPLANT
DRSG MEPILEX POST OP 4X8 (GAUZE/BANDAGES/DRESSINGS) ×1 IMPLANT
DRSG TEGADERM 4X4.75 (GAUZE/BANDAGES/DRESSINGS) ×3 IMPLANT
DURAPREP 26ML APPLICATOR (WOUND CARE) ×1 IMPLANT
ELECT PENCIL ROCKER SW 15FT (MISCELLANEOUS) ×1 IMPLANT
ELECT REM PT RETURN 9FT ADLT (ELECTROSURGICAL) ×1 IMPLANT
ELECTRODE REM PT RTRN 9FT ADLT (ELECTROSURGICAL) ×1 IMPLANT
GAUZE SPONGE 4X4 12PLY STRL (GAUZE/BANDAGES/DRESSINGS) ×1 IMPLANT
GLOVE BIO SURGEON STRL SZ7.5 (GLOVE) ×1 IMPLANT
GLOVE INDICATOR 7.5 STRL GRN (GLOVE) ×1 IMPLANT
GOWN STRL SURGICAL XL XLNG (GOWN DISPOSABLE) ×1 IMPLANT
KIT BASIN OR (CUSTOM PROCEDURE TRAY) ×1 IMPLANT
KIT POSITION SURG JACKSON T1 (MISCELLANEOUS) ×1 IMPLANT
KIT TURNOVER KIT B (KITS) ×1 IMPLANT
NS IRRIG 1000ML POUR BTL (IV SOLUTION) ×1 IMPLANT
PACK LAMINECTOMY ORTHO (CUSTOM PROCEDURE TRAY) ×1 IMPLANT
PATTIES SURGICAL .5 X.5 (GAUZE/BANDAGES/DRESSINGS) IMPLANT
SPONGE SURGIFOAM ABS GEL 100 (HEMOSTASIS) ×1 IMPLANT
SUCTION TUBE FRAZIER 10FR DISP (SUCTIONS) ×1 IMPLANT
SUT BONE WAX W31G (SUTURE) ×1 IMPLANT
SUT MNCRL AB 3-0 PS2 27 (SUTURE) ×1 IMPLANT
SUT MNCRL+ AB 3-0 CT1 36 (SUTURE) ×1 IMPLANT
SUT VIC AB 0 CT1 18XCR BRD8 (SUTURE) ×1 IMPLANT
SUT VIC AB 2-0 CT1 18 (SUTURE) ×1 IMPLANT
SUT VIC AB 2-0 CT2 18 VCP726D (SUTURE) ×1 IMPLANT
TOWEL GREEN STERILE (TOWEL DISPOSABLE) ×1 IMPLANT
TOWEL GREEN STERILE FF (TOWEL DISPOSABLE) ×1 IMPLANT
TUBING FEATHERFLOW (TUBING) ×1 IMPLANT
WATER STERILE IRR 1000ML POUR (IV SOLUTION) ×1 IMPLANT

## 2023-07-10 NOTE — Op Note (Signed)
 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 male who presented to the office with symptoms consistent with neurogenic claudication. The patient had tried conservative treatments that did not provide any lasting relief. As result, operative management was discussed. The pre-operative MRI showed stenosis at L4 and L5 so a L4 and L5 laminectomy with partial medial facetectomies was presented as a treatment option. The risks including but not limited to iatrogenic instability, dural tear, nerve root injury, paralysis, persistent pain, infection, bleeding, heart attack, death, fracture, blindness, dvt/pe, and need for additional procedures were discussed with the patient. The benefit of the surgery would be relief of the patient's radiating leg and buttock pain. The alternatives to surgical management were covered with the patient and included continued monitoring, physical therapy, over-the-counter pain medications, ambulatory aids, injections, and activity modification. All the patient's questions were answered to his satisfaction. After this discussion, the patient expressed understanding and elected to proceed with surgical intervention.   Procedure Description: The patient was met in the pre-operative holding area. The patient's identity and consent were verified. The operative site was marked.  The patient's remaining questions about the surgery were answered. The patient was brought back to the operating room. General anesthesia was induced and an endotracheal tube was placed by the anesthesia staff. The patient was transferred to the prone [place] table in the prone position. All bony prominences were well padded. The head of the bed was slightly elevated and the eyes were free from compression by the face pillow. The surgical area was cleansed with alcohol. Fluoroscopy was then brought in to check rotation on the AP image and to mark the levels on the lateral image. The patient's skin was then prepped and draped in a standard, sterile fashion. A time out was performed that identified the patient, the procedure, and the operative levels. All team members agreed with what was stated in the time out.   A midline incision over the spinous processes of the previously marked levels was made and sharp dissection was continued down through the skin and dermis. Electrocautery was then used to continue the midline dissection down to the level of the spinous process. Subperiosteal dissection was performed using electrocautery to expose the lamina out lateral to the facet joint capsule. Care was taken to not violate the facet joint capsules. A lateral fluoroscopic image was taken to confirm the level. Subperiosteal dissection with electrocautery was then done to expose the L4 and L5 lamina and pars. Again, care was taken to avoid disruption of the facet capsules.    A rongeur was used to remove the spinous processes and interspinous ligaments between the L3/4 interspinous ligament to the cranial portion of the L5 spinous process. Bone wax was used to obtain hemostasis at the bleeding bony surfaces. A high-speed burr was used to thin the lamina at L4 and the cranial aspect of L5 to the level of the ligamentum flavum. Above the level of the ligamentum, the lamina was thinned with the burr to the  approximate level  of the ligamentum. Care was taken to leave at least 8mm of pars interarticularis on each side. A series of Kerrison rongeurs were used to remove the remaining lamina and ligamentum overlying the thecal sac. A woodsen was then used to protect the thecal sac as a kerrison was used to remove the medial portion of the L4/5 facet on both sides.    A woodsen was placed into the laminectomy site to palpate for any remaining areas of stenosis. Once it was confirmed with the woodsen that decompression had been completed from the medial pedicle wall to the contralateral medial pedicle wall and from the pedicle of the L4 to the pedicle of L5, decompression was determined to be completed. The wound was copiously irrigated with sterile saline. 1g of vancomycin powder was placed into the wound. The fascia was reapproximated with 0 vicryl suture. The subcutaneous fat was reapproximated with 0 vicryl suture. The deep dermal layer was reapproximated with 2-0 vicryl. The skin as closed with a 3-0 running monocryl. All counts were correct at the end of the case. The incision was dressed with steri strips and benzoine. An island dressing was placed over the wound. The patient was transferred back to a bed and brought to the post-anesthesia care unit by anesthesia staff in stable condition.  Post-operative plan: The patient will recover in the post-anesthesia care unit and then go to the floor. The patient will receive two post-operative doses of ancef. He will get another dose of TXA too. The patient will be out of bed as tolerated with no brace. The patient will work with physical therapy. The patient will likely discharge to home tomorrow.   Willia Craze, MD Orthopedic Surgeon

## 2023-07-10 NOTE — Discharge Instructions (Addendum)
 Orthopedic Surgery Discharge Instructions  Patient name: Kenneth Henson Procedure Performed: L4/5 laminectomy Date of Surgery: 07/10/2023 Surgeon: Willia Craze, MD  Pre-operative Diagnosis: lumbar stenosis with neurogenic claudication Post-operative Diagnosis: same as above  Discharged to: home Discharge Condition: stable  Activity: You should refrain from bending, lifting, or twisting with objects greater than ten pounds until six weeks after surgery. You are encouraged to walk as much as desired. You can perform household activities such as cleaning dishes, doing laundry, vacuuming, etc. as long as the ten-pound restriction is followed. You do not need to wear a brace during the post-operative period.   Incision Care: Your incision site has a dressing over it. That dressing should remain in place and dry at all times for a total of one week after surgery. After one week, you can remove the dressing. Underneath the dressing, you will find pieces of tape. You should leave these pieces of tape in place. They will fall off with time. Do not pick, rub, or scrub at them. Do not put cream or lotion over the surgical area. After one week and once the dressing is off, it is okay to let soap and water run over your incision. Again, do not pick, scrub, or rub at the pieces of tape when bathing. Do not submerge (e.g., take a bath, swim, go in a hot tub, etc.) until six weeks after surgery. There may be some bloody drainage from the incision into the dressing after surgery. This is normal. You do not need to replace the dressing. Continue to leave it in place for the one week as instructed above. Should the dressing become saturated with blood or drainage, please call the office for further instructions.   Medications: You have been prescribed Norco. This is a narcotic pain medication and should only be taken as prescribed. You should not drink alcohol or operate heavy machinery (including driving) while  taking this medication. The Norco can cause constipation as a side effect. For that reason, you have been prescribed senna and miralax. These are both laxatives. You do not need to take this medication if you develop diarrhea. Should you remain constipated even while taking these medications, please increase the dose of miralax to twice daily. Robaxin is a muscle relaxer that has been prescribed to you for muscle spasm type pain. Take this medication as needed.   You should not use over-the-counter NSAIDs (ibuprofen, Aleve, Celebrex, naproxen, meloxicam, etc.) for additional pain relief since these medications are similar to the aspirin you are already taking. The combination can cause stomach ulcers and injury to the kidneys.   In order to set expectations for opioid prescriptions, you will only be prescribed opioids for a total of six weeks after surgery and, at two-weeks after surgery, your opioid prescription will start to tapered (decreased dosage and number of pills). If you have ongoing need for opioid medication six weeks after surgery, you will be referred to pain management. If you are already established with a provider that is giving you opioid medications, you should schedule an appointment with them for six weeks after surgery if you feel you are going to need another prescription. State law only allows for opioid prescriptions one week at a time. If you are running out of opioid medication near the end of the week, please call the office during business hours before running out so I can send you another prescription.   You may resume any home blood thinners (warfarin, lovenox, aspirin, apixaban, plavix,  xarelto, etc) 72 hours after your surgery. Take these medications as they were previously prescribed.  Driving: You should not drive while taking narcotic pain medications. You should start getting back to driving slowly and you may want to try driving in a parking lot before doing anything  more.   Diet: You are safe to resume your regular diet after surgery.   Reasons to Call the Office After Surgery: You should feel free to call the office with any concerns or questions you have in the post-operative period, but you should definitely notify the office if you develop: -shortness of breath, chest pain, or trouble breathing -excessive bleeding, drainage, redness, or swelling around the surgical site -fevers, chills, or pain that is getting worse with each passing day -persistent nausea or vomiting -new weakness in either leg -new or worsening numbness or tingling in either leg -numbness in the groin, bowel or bladder incontinence -other concerns about your surgery  Follow Up Appointments: You should have an office appointment scheduled for approximately two weeks after surgery. If you do not remember when this appointment is or do not already have it scheduled, please call the office to schedule.   Office Information:  -Willia Craze, MD -Phone number: [phone] -Address: [address]       [place], [place] [address]

## 2023-07-10 NOTE — Progress Notes (Signed)
 Orthopedic Surgery Post-operative Progress Note  Assessment: Patient is a 83 y.o. male who is currently admitted after undergoing L4/5 laminectomy   Plan: -Operative plans complete -Drains: none -Out of bed as tolerated, no brace -No bending/lifting/twisting greater than 10 pounds -OT evaluate and treat -Pain control -Diabetic diet -No chemoprophylaxis for dvt or antiplatelets for 72 hours after surgery -Antibiotics x2 post-operative doses -Disposition: to floor from PACU  ___________________________________________________________________________   Subjective: No acute events since surgery. Recovering in PACU.    Objective:  General: no acute distress, appropriate affect Neurologic: alert, answering questions appropriately, following commands Respiratory: unlabored breathing on room air Skin: dressing clear/dry/intact  MSK (spine):  -Strength exam      Right  Left  EHL    5/5  5/5 TA    5/5  5/5 GSC    5/5  5/5 Knee extension  5/5  5/5 Hip flexion   5/5  5/5  -Sensory exam    Sensation intact to light touch in L2-S1 nerve distributions of bilateral lower extremities   Patient name: Kenneth Henson Patient MRN: [id number] Date: 07/10/23

## 2023-07-10 NOTE — Discharge Summary (Signed)
 Orthopedic Surgery Discharge Summary  Patient name: Kenneth Henson Patient MRN: [id number] Admit today: 07/10/2023 Discharge date: 07/11/2023  Attending physician: Willia Craze, MD Final diagnosis: lumbar stenosis with neurogenic claudication Findings: L4/5 hypertrophic facets and thickened ligamentum flavum   Hospital course: Patient is a 83 y.o. male who was admitted after undergoing L4/5 laminectomy with partial medial facetectomies. The patient had significant pain immediately after surgery, but pain eventually was controlled with a multimodal regimen including Norco. Labs during the hospitalization revealed no significant anemia or electrolyte abnormality. The patient worked with physical therapy who recommended discharge to home. The patient was tolerating an oral diet without issue and was voiding spontaneously after surgery. The patient's vitals were stable on the day of discharge. The patient was medically ready for discharge and was discharge to home on post-operative day one.  Instructions:   Orthopedic Surgery Discharge Instructions  Patient name: Kenneth Henson Procedure Performed: L4/5 laminectomy Date of Surgery: 07/10/2023 Surgeon: Willia Craze, MD  Pre-operative Diagnosis: lumbar stenosis with neurogenic claudication Post-operative Diagnosis: same as above  Discharged to: home Discharge Condition: stable  Activity: You should refrain from bending, lifting, or twisting with objects greater than ten pounds until six weeks after surgery. You are encouraged to walk as much as desired. You can perform household activities such as cleaning dishes, doing laundry, vacuuming, etc. as long as the ten-pound restriction is followed. You do not need to wear a brace during the post-operative period.   Incision Care: Your incision site has a dressing over it. That dressing should remain in place and dry at all times for a total of one week after surgery. After one week, you can  remove the dressing. Underneath the dressing, you will find pieces of tape. You should leave these pieces of tape in place. They will fall off with time. Do not pick, rub, or scrub at them. Do not put cream or lotion over the surgical area. After one week and once the dressing is off, it is okay to let soap and water run over your incision. Again, do not pick, scrub, or rub at the pieces of tape when bathing. Do not submerge (e.g., take a bath, swim, go in a hot tub, etc.) until six weeks after surgery. There may be some bloody drainage from the incision into the dressing after surgery. This is normal. You do not need to replace the dressing. Continue to leave it in place for the one week as instructed above. Should the dressing become saturated with blood or drainage, please call the office for further instructions.   Medications: You have been prescribed Norco. This is a narcotic pain medication and should only be taken as prescribed. You should not drink alcohol or operate heavy machinery (including driving) while taking this medication. The Norco can cause constipation as a side effect. For that reason, you have been prescribed senna and miralax. These are both laxatives. You do not need to take this medication if you develop diarrhea. Should you remain constipated even while taking these medications, please increase the dose of miralax to twice daily. Robaxin is a muscle relaxer that has been prescribed to you for muscle spasm type pain. Take this medication as needed.   You should not use over-the-counter NSAIDs (ibuprofen, Aleve, Celebrex, naproxen, meloxicam, etc.) for additional pain relief since these medications are similar to the aspirin you are already taking. The combination can cause stomach ulcers and injury to the kidneys.   In order to set expectations  for opioid prescriptions, you will only be prescribed opioids for a total of six weeks after surgery and, at two-weeks after surgery, your  opioid prescription will start to tapered (decreased dosage and number of pills). If you have ongoing need for opioid medication six weeks after surgery, you will be referred to pain management. If you are already established with a provider that is giving you opioid medications, you should schedule an appointment with them for six weeks after surgery if you feel you are going to need another prescription. State law only allows for opioid prescriptions one week at a time. If you are running out of opioid medication near the end of the week, please call the office during business hours before running out so I can send you another prescription.   You may resume any home blood thinners (warfarin, lovenox, aspirin, apixaban, plavix, xarelto, etc) 72 hours after your surgery. Take these medications as they were previously prescribed.  Driving: You should not drive while taking narcotic pain medications. You should start getting back to driving slowly and you may want to try driving in a parking lot before doing anything more.   Diet: You are safe to resume your regular diet after surgery.   Reasons to Call the Office After Surgery: You should feel free to call the office with any concerns or questions you have in the post-operative period, but you should definitely notify the office if you develop: -shortness of breath, chest pain, or trouble breathing -excessive bleeding, drainage, redness, or swelling around the surgical site -fevers, chills, or pain that is getting worse with each passing day -persistent nausea or vomiting -new weakness in either leg -new or worsening numbness or tingling in either leg -numbness in the groin, bowel or bladder incontinence -other concerns about your surgery  Follow Up Appointments: You should have an office appointment scheduled for approximately two weeks after surgery. If you do not remember when this appointment is or do not already have it scheduled, please call  the office to schedule.   Office Information:  -Willia Craze, MD -Phone number: [phone] -Address: [address]       [place], [place] [address]

## 2023-07-10 NOTE — Plan of Care (Signed)

## 2023-07-10 NOTE — Transfer of Care (Signed)
 Immediate Anesthesia Transfer of Care Note  Patient: KWABENA STRUTZ  Procedure(s) Performed: Lumbar  four-five DECOMPRESSIVE LUMBAR LAMINECTOMY LEVEL (Spine Lumbar)  Patient Location: PACU  Anesthesia Type:General  Level of Consciousness: awake and alert   Airway & Oxygen Therapy: Patient Spontanous Breathing and Patient connected to face mask oxygen  Post-op Assessment: Report given to RN, Post -op Vital signs reviewed and stable, and Patient moving all extremities X 4  Post vital signs: Reviewed and stable  Last Vitals:  Vitals Value Taken Time  BP 199/116 07/10/23 1045  Temp 36.2 C 07/10/23 1045  Pulse 105 07/10/23 1045  Resp 27 07/10/23 1045  SpO2 96 % 07/10/23 1045  Vitals shown include unfiled device data.  Last Pain:  Vitals:   07/10/23 0623  TempSrc:   PainSc: 0-No pain      Patients Stated Pain Goal: 0 (07/10/23 5409)  Complications: No notable events documented.

## 2023-07-10 NOTE — Anesthesia Procedure Notes (Signed)
 Procedure Name: Intubation Date/Time: 07/10/2023 7:41 AM  Performed by: Little Ishikawa, CRNAPre-anesthesia Checklist: Patient identified, Emergency Drugs available, Suction available, Timeout performed and Patient being monitored Patient Re-evaluated:Patient Re-evaluated prior to induction Oxygen Delivery Method: Circle system utilized Preoxygenation: Pre-oxygenation with 100% oxygen Induction Type: IV induction Ventilation: Mask ventilation without difficulty Laryngoscope Size: Mac and 4 Grade View: Grade II Tube type: Oral Tube size: 7.5 mm Number of attempts: 1 Airway Equipment and Method: Stylet Placement Confirmation: ETT inserted through vocal cords under direct vision, positive ETCO2, CO2 detector and breath sounds checked- equal and bilateral Secured at: 22 cm Tube secured with: Tape Dental Injury: Teeth and Oropharynx as per pre-operative assessment

## 2023-07-10 NOTE — Anesthesia Postprocedure Evaluation (Signed)
 Anesthesia Post Note  Patient: Kenneth Henson  Procedure(s) Performed: Lumbar  four-five DECOMPRESSIVE LUMBAR LAMINECTOMY LEVEL (Spine Lumbar)     Patient location during evaluation: PACU Anesthesia Type: General Level of consciousness: awake and alert Pain management: pain level controlled Vital Signs Assessment: post-procedure vital signs reviewed and stable Respiratory status: spontaneous breathing, nonlabored ventilation, respiratory function stable and patient connected to nasal cannula oxygen Cardiovascular status: blood pressure returned to baseline and stable Postop Assessment: no apparent nausea or vomiting Anesthetic complications: no   No notable events documented.  Last Vitals:  Vitals:   07/10/23 1115 07/10/23 1130  BP: (!) 161/78 (!) 154/77  Pulse: 92 87  Resp: 16 20  Temp:  36.5 C  SpO2: 94% 94%    Last Pain:  Vitals:   07/10/23 1130  TempSrc:   PainSc: 0-No pain                 [place]

## 2023-07-11 ENCOUNTER — Other Ambulatory Visit (HOSPITAL_COMMUNITY): Payer: Self-pay

## 2023-07-11 ENCOUNTER — Encounter (HOSPITAL_COMMUNITY): Payer: Self-pay | Admitting: Orthopedic Surgery

## 2023-07-11 DIAGNOSIS — M48062 Spinal stenosis, lumbar region with neurogenic claudication: Secondary | ICD-10-CM | POA: Diagnosis not present

## 2023-07-11 LAB — BASIC METABOLIC PANEL
Anion gap: 12 (ref 5–15)
BUN: 9 mg/dL (ref 8–23)
CO2: 27 mmol/L (ref 22–32)
Calcium: 9.3 mg/dL (ref 8.9–10.3)
Chloride: 97 mmol/L — ABNORMAL LOW (ref 98–111)
Creatinine, Ser: 0.92 mg/dL (ref 0.61–1.24)
GFR, Estimated: >60 mL/min (ref >60)
Glucose, Bld: 141 mg/dL — ABNORMAL HIGH (ref 70–99)
Potassium: 4.7 mmol/L (ref 3.5–5.1)
Sodium: 136 mmol/L (ref 135–145)

## 2023-07-11 LAB — CBC
HCT: 43.2 % (ref 39.0–52.0)
Hemoglobin: 14.1 g/dL (ref 13.0–17.0)
MCH: 29.6 pg (ref 26.0–34.0)
MCHC: 32.6 g/dL (ref 30.0–36.0)
MCV: 90.8 fL (ref 80.0–100.0)
Platelets: 280 10*3/uL (ref 150–400)
RBC: 4.76 MIL/uL (ref 4.22–5.81)
RDW: 13.1 % (ref 11.5–15.5)
WBC: 15.3 10*3/uL — ABNORMAL HIGH (ref 4.0–10.5)
nRBC: 0 % (ref 0.0–0.2)

## 2023-07-11 LAB — GLUCOSE, CAPILLARY: Glucose-Capillary: 135 mg/dL — ABNORMAL HIGH (ref 70–99)

## 2023-07-11 NOTE — Progress Notes (Signed)
 Patient in no acute distress nor complaints of pain nor discomfort; incision on back is clean, dry and intact; No c/o pain at this time. Room was checked and accounted for all patient's belongings; discharge instructions concerning her medications, incision care, follow up appointment and when to call the doctor as needed were all discussed with patient by RN and he expressed understanding on the instructions given.

## 2023-07-11 NOTE — Plan of Care (Signed)
 Problem: Education: Goal: Knowledge of General Education information will improve Description: Including pain rating scale, medication(s)/side effects and non-pharmacologic comfort measures 07/11/2023 0815 by Vincente Liberty, RN Outcome: Completed/Met 07/10/2023 1824 by Vincente Liberty, RN Outcome: Progressing   Problem: Health Behavior/Discharge Planning: Goal: Ability to manage health-related needs will improve 07/11/2023 0815 by Vincente Liberty, RN Outcome: Completed/Met 07/10/2023 1824 by Vincente Liberty, RN Outcome: Progressing   Problem: Clinical Measurements: Goal: Ability to maintain clinical measurements within normal limits will improve 07/11/2023 0815 by Vincente Liberty, RN Outcome: Completed/Met 07/10/2023 1824 by Vincente Liberty, RN Outcome: Progressing Goal: Will remain free from infection 07/11/2023 0815 by Vincente Liberty, RN Outcome: Completed/Met 07/10/2023 1824 by Vincente Liberty, RN Outcome: Progressing Goal: Diagnostic test results will improve 07/11/2023 0815 by Vincente Liberty, RN Outcome: Completed/Met 07/10/2023 1824 by Vincente Liberty, RN Outcome: Progressing Goal: Respiratory complications will improve 07/11/2023 0815 by Vincente Liberty, RN Outcome: Completed/Met 07/10/2023 1824 by Vincente Liberty, RN Outcome: Progressing Goal: Cardiovascular complication will be avoided 07/11/2023 0815 by Vincente Liberty, RN Outcome: Completed/Met 07/10/2023 1824 by Vincente Liberty, RN Outcome: Progressing   Problem: Activity: Goal: Risk for activity intolerance will decrease 07/11/2023 0815 by Vincente Liberty, RN Outcome: Completed/Met 07/10/2023 1824 by Vincente Liberty, RN Outcome: Progressing   Problem: Nutrition: Goal: Adequate nutrition will be maintained 07/11/2023 0815 by Vincente Liberty, RN Outcome: Completed/Met 07/10/2023 1824 by Vincente Liberty, RN Outcome: Progressing   Problem: Coping: Goal: Level of anxiety will decrease 07/11/2023  0815 by Vincente Liberty, RN Outcome: Completed/Met 07/10/2023 1824 by Vincente Liberty, RN Outcome: Progressing   Problem: Elimination: Goal: Will not experience complications related to bowel motility 07/11/2023 0815 by Vincente Liberty, RN Outcome: Completed/Met 07/10/2023 1824 by Vincente Liberty, RN Outcome: Progressing Goal: Will not experience complications related to urinary retention 07/11/2023 0815 by Vincente Liberty, RN Outcome: Completed/Met 07/10/2023 1824 by Vincente Liberty, RN Outcome: Progressing   Problem: Pain Managment: Goal: General experience of comfort will improve and/or be controlled 07/11/2023 0815 by Vincente Liberty, RN Outcome: Completed/Met 07/10/2023 1824 by Vincente Liberty, RN Outcome: Progressing   Problem: Safety: Goal: Ability to remain free from injury will improve 07/11/2023 0815 by Vincente Liberty, RN Outcome: Completed/Met 07/10/2023 1824 by Vincente Liberty, RN Outcome: Progressing   Problem: Skin Integrity: Goal: Risk for impaired skin integrity will decrease 07/11/2023 0815 by Vincente Liberty, RN Outcome: Completed/Met 07/10/2023 1824 by Vincente Liberty, RN Outcome: Progressing   Problem: Education: Goal: Ability to describe self-care measures that may prevent or decrease complications (Diabetes Survival Skills Education) will improve 07/11/2023 0815 by Vincente Liberty, RN Outcome: Completed/Met 07/10/2023 1824 by Vincente Liberty, RN Outcome: Progressing Goal: Individualized Educational Video(s) 07/11/2023 0815 by Vincente Liberty, RN Outcome: Completed/Met 07/10/2023 1824 by Vincente Liberty, RN Outcome: Progressing   Problem: Coping: Goal: Ability to adjust to condition or change in health will improve 07/11/2023 0815 by Vincente Liberty, RN Outcome: Completed/Met 07/10/2023 1824 by Vincente Liberty, RN Outcome: Progressing   Problem: Fluid Volume: Goal: Ability to maintain a balanced intake and output will improve 07/11/2023  0815 by Vincente Liberty, RN Outcome: Completed/Met 07/10/2023 1824 by Vincente Liberty, RN Outcome: Progressing   Problem: Health Behavior/Discharge Planning: Goal: Ability to identify and utilize available resources and services will improve 07/11/2023 0815 by Vincente Liberty, RN Outcome: Completed/Met 07/10/2023 1824 by Vincente Liberty, RN Outcome: Progressing Goal: Ability to manage health-related needs will improve 07/11/2023 0815 by Vincente Liberty, RN Outcome: Completed/Met 07/10/2023 1824 by Vincente Liberty, RN Outcome: Progressing   Problem: Metabolic: Goal: Ability to maintain appropriate glucose  levels will improve 07/11/2023 0815 by Vincente Liberty, RN Outcome: Completed/Met 07/10/2023 1824 by Vincente Liberty, RN Outcome: Progressing   Problem: Nutritional: Goal: Maintenance of adequate nutrition will improve 07/11/2023 0815 by Vincente Liberty, RN Outcome: Completed/Met 07/10/2023 1824 by Vincente Liberty, RN Outcome: Progressing Goal: Progress toward achieving an optimal weight will improve 07/11/2023 0815 by Vincente Liberty, RN Outcome: Completed/Met 07/10/2023 1824 by Vincente Liberty, RN Outcome: Progressing   Problem: Skin Integrity: Goal: Risk for impaired skin integrity will decrease 07/11/2023 0815 by Vincente Liberty, RN Outcome: Completed/Met 07/10/2023 1824 by Vincente Liberty, RN Outcome: Progressing   Problem: Tissue Perfusion: Goal: Adequacy of tissue perfusion will improve 07/11/2023 0815 by Vincente Liberty, RN Outcome: Completed/Met 07/10/2023 1824 by Vincente Liberty, RN Outcome: Progressing

## 2023-07-11 NOTE — Evaluation (Signed)
 Occupational Therapy Evaluation Patient Details Name: Kenneth Henson MRN: [id number] DOB: [birth year] Today's Date: 07/11/2023   History of Present Illness   83 yo male s/p 2/26 L4-5 laminectomy and partial medial facetectomies PMH DM HLD HTN R TSA neck surgery, ORIF R humerus     Clinical Impressions Patient is s/p L4-5 laminectomy surgery resulting in functional limitations due to the deficits listed below (see OT problem list). Pt at baseline uses DME and is indep. Pt assisted with dressing for home with (A) for LB dressing due to visual deficits. Pt has reacher at home and using oversized clothing to help with lower clothing. Pt advised to dress in sitting which pt demonstrates. Due to visual deficit pt asked to repeat all precautions verbally as he can not read the handout. Handout presented for family education and reference. Pt able to recall information at end of session. Patient will benefit from skilled OT acutely to increase independence and safety with ADLS to allow discharge home. .      If plan is discharge home, recommend the following:   A little help with walking and/or transfers;A little help with bathing/dressing/bathroom     Functional Status Assessment   Patient has had a recent decline in their functional status and demonstrates the ability to make significant improvements in function in a reasonable and predictable amount of time.     Equipment Recommendations   None recommended by OT     Recommendations for Other Services         Precautions/Restrictions   Precautions Precautions: Back Recall of Precautions/Restrictions: Intact Precaution/Restrictions Comments: handout provided and reviewed for precautions Restrictions Weight Bearing Restrictions Per Provider Order: No     Mobility Bed Mobility               General bed mobility comments: oob on arrival has lift chair at home    Transfers Overall transfer level: Needs  assistance Equipment used: Rolling walker (2 wheels) Transfers: Sit to/from Stand Sit to Stand: Min assist           General transfer comment: cues to slide foward and seat to power up . pt uses lift chair at home      Balance Overall balance assessment: Needs assistance Sitting-balance support: Bilateral upper extremity supported, Feet supported Sitting balance-Leahy Scale: Fair     Standing balance support: Single extremity supported, During functional activity, Reliant on assistive device for balance Standing balance-Leahy Scale: Fair                             ADL either performed or assessed with clinical judgement   ADL Overall ADL's : Needs assistance/impaired Eating/Feeding: Independent   Grooming: Wash/dry hands;Wash/dry face;Modified independent   Upper Body Bathing: Modified independent   Lower Body Bathing: Minimal assistance   Upper Body Dressing : Modified independent   Lower Body Dressing: Minimal assistance   Toilet Transfer: Contact guard assist   Toileting- Clothing Manipulation and Hygiene: Contact guard assist       Functional mobility during ADLs: Contact guard assist;Rolling walker (2 wheels) General ADL Comments: pt needed verbal cues during session due to visual deficits to correctly move RW within small room space     Vision Baseline Vision/History: 2 Legally blind Ability to See in Adequate Light: 3 Highly impaired Vision Assessment?: No apparent visual deficits Additional Comments: 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     Perception Perception: Impaired Preception Impairment Details: Figure ground     Praxis         Pertinent Vitals/Pain Pain Assessment Pain Assessment: Faces Faces Pain Scale: Hurts even more Pain Location: back Pain Descriptors / Indicators: Discomfort, Operative site guarding Pain Intervention(s):  Monitored during session, Repositioned, Limited activity within patient's tolerance, Patient requesting pain meds-RN notified     Extremity/Trunk Assessment Upper Extremity Assessment Upper Extremity Assessment: Overall WFL for tasks assessed   Lower Extremity Assessment Lower Extremity Assessment: Overall WFL for tasks assessed   Cervical / Trunk Assessment Cervical / Trunk Assessment: Back Surgery   Communication Communication Communication: No apparent difficulties   Cognition Arousal: Alert Behavior During Therapy: WFL for tasks assessed/performed Cognition: No apparent impairments                               Following commands: Intact       Cueing  General Comments          Exercises     Shoulder Instructions      Home Living Family/patient expects to be discharged to:: Private residence Living Arrangements: Alone Available Help at Discharge: Family;Available 24 hours/day;Available PRN/intermittently Type of Home: House Home Access: Level entry     Home Layout: One level     Bathroom Shower/Tub: Producer, television/film/video: Handicapped height     Home Equipment: Rollator (4 wheels);Cane - single point;Shower seat;Grab bars - tub/shower;Rolling Walker (2 wheels);Grab bars - toilet;Hand held shower head;Adaptive equipment;Electric scooter;Lift chair;Hospital bed Adaptive Equipment: Reacher;Sock aid;Long-handled sponge Additional Comments: pt is [employer] so he has all necessary DME at this time and access to it if needed. Daughter and son in law (A) pt upon d/c. daughter will stay with the patient      Prior Functioning/Environment Prior Level of Function : Needs assist                    OT Problem List: Decreased strength;Decreased activity tolerance;Impaired balance (sitting and/or standing);Decreased knowledge of precautions   OT Treatment/Interventions: Self-care/ADL training;Therapeutic exercise;Neuromuscular  education;Energy conservation;DME and/or AE instruction;Manual therapy;Therapeutic activities;Patient/family education;Balance training      OT Goals(Current goals can be found in the care plan section)   Acute Rehab OT Goals Potential to Achieve Goals: Good   OT Frequency:  Min 1X/week    Co-evaluation              AM-PAC OT "6 Clicks" Daily Activity     Outcome Measure Help from another person eating meals?: None Help from another person taking care of personal grooming?: None Help from another person toileting, which includes using toliet, bedpan, or urinal?: A Little Help from another person bathing (including washing, rinsing, drying)?: A Little Help from another person to put on and taking off regular upper body clothing?: None Help from another person to put on and taking off regular lower body clothing?: A Lot 6 Click Score: 20   End of Session Equipment Utilized During Treatment: Rolling walker (2 wheels) Nurse Communication: Mobility status;Precautions  Activity Tolerance: Patient tolerated treatment well Patient left: in chair;with call bell/phone within reach  OT Visit Diagnosis: Unsteadiness on feet (R26.81)                Time: 0931-1000 OT Time Calculation (min): 29 min Charges:  OT General Charges $OT Visit:  1 Visit OT Evaluation $OT Eval Moderate Complexity: 1 Mod OT Treatments $Self Care/Home Management : 8-22 mins   Brynn, OTR/L  Acute Rehabilitation Services Office: [phone] .   Mateo Flow 07/11/2023, 10:24 AM

## 2023-07-11 NOTE — Progress Notes (Signed)
 Orthopedic Surgery Post-operative Progress Note  Assessment: Patient is a 83 y.o. male who is currently admitted after undergoing L4/5 laminectomy   Plan: -Operative plans complete -Drains: none -Out of bed as tolerated, no brace -No bending/lifting/twisting greater than 10 pounds -OT evaluate and treat -Pain control -Diabetic diet -No chemoprophylaxis for dvt or antiplatelets for 72 hours after surgery -Ancef x2 post-operative doses -Anticipate discharge to home today  ___________________________________________________________________________   Subjective: No acute events overnight. States that he was able to get some sleep last night. Pain well controlled with Norco. Is having some soreness in the back. States he is not having any radiating leg pain this morning.   Objective:  General: no acute distress, appropriate affect Neurologic: alert, answering questions appropriately, following commands Respiratory: unlabored breathing on room air Skin: dressing with smaller than dime-sized spot of dry blood otherwise c/d/i  MSK (spine):  -Strength exam      Right  Left  EHL    5/5  5/5 TA    5/5  5/5 GSC    5/5  5/5 Knee extension  5/5  5/5 Hip flexion   5/5  5/5  -Sensory exam    Sensation intact to light touch in L2-S1 nerve distributions of bilateral lower extremities   Patient name: Kenneth Henson Patient MRN: [id number] Date: 07/11/23

## 2023-07-12 ENCOUNTER — Telehealth: Payer: Self-pay | Admitting: Orthopedic Surgery

## 2023-07-12 ENCOUNTER — Emergency Department (HOSPITAL_COMMUNITY)
Admission: EM | Admit: 2023-07-12 | Discharge: 2023-07-12 | Disposition: A | Discharge status: Home / Self Care | Payer: Medicare Other | Attending: Student | Admitting: Student

## 2023-07-12 ENCOUNTER — Emergency Department (HOSPITAL_COMMUNITY): Payer: Medicare Other

## 2023-07-12 ENCOUNTER — Encounter (HOSPITAL_COMMUNITY): Payer: Self-pay

## 2023-07-12 ENCOUNTER — Other Ambulatory Visit: Payer: Self-pay

## 2023-07-12 DIAGNOSIS — T887XXA Unspecified adverse effect of drug or medicament, initial encounter: Secondary | ICD-10-CM | POA: Diagnosis not present

## 2023-07-12 DIAGNOSIS — F1190 Opioid use, unspecified, uncomplicated: Secondary | ICD-10-CM | POA: Diagnosis not present

## 2023-07-12 DIAGNOSIS — Z7984 Long term (current) use of oral hypoglycemic drugs: Secondary | ICD-10-CM | POA: Insufficient documentation

## 2023-07-12 DIAGNOSIS — E119 Type 2 diabetes mellitus without complications: Secondary | ICD-10-CM | POA: Diagnosis not present

## 2023-07-12 DIAGNOSIS — Z96611 Presence of right artificial shoulder joint: Secondary | ICD-10-CM | POA: Diagnosis not present

## 2023-07-12 DIAGNOSIS — Z85828 Personal history of other malignant neoplasm of skin: Secondary | ICD-10-CM | POA: Insufficient documentation

## 2023-07-12 DIAGNOSIS — R42 Dizziness and giddiness: Secondary | ICD-10-CM

## 2023-07-12 DIAGNOSIS — I10 Essential (primary) hypertension: Secondary | ICD-10-CM | POA: Insufficient documentation

## 2023-07-12 DIAGNOSIS — R4182 Altered mental status, unspecified: Secondary | ICD-10-CM | POA: Diagnosis not present

## 2023-07-12 DIAGNOSIS — T402X5A Adverse effect of other opioids, initial encounter: Secondary | ICD-10-CM | POA: Diagnosis not present

## 2023-07-12 DIAGNOSIS — Z7982 Long term (current) use of aspirin: Secondary | ICD-10-CM | POA: Insufficient documentation

## 2023-07-12 DIAGNOSIS — Z87891 Personal history of nicotine dependence: Secondary | ICD-10-CM | POA: Diagnosis not present

## 2023-07-12 DIAGNOSIS — Z79899 Other long term (current) drug therapy: Secondary | ICD-10-CM

## 2023-07-12 LAB — CBC WITH DIFFERENTIAL/PLATELET
Abs Immature Granulocytes: 0.04 10*3/uL (ref 0.00–0.07)
Basophils Absolute: 0 10*3/uL (ref 0.0–0.1)
Basophils Relative: 0 %
Eosinophils Absolute: 0.1 10*3/uL (ref 0.0–0.5)
Eosinophils Relative: 1 %
HCT: 46.2 % (ref 39.0–52.0)
Hemoglobin: 15.1 g/dL (ref 13.0–17.0)
Immature Granulocytes: 0 %
Lymphocytes Relative: 11 %
Lymphs Abs: 1.3 10*3/uL (ref 0.7–4.0)
MCH: 30.2 pg (ref 26.0–34.0)
MCHC: 32.7 g/dL (ref 30.0–36.0)
MCV: 92.4 fL (ref 80.0–100.0)
Monocytes Absolute: 1.3 10*3/uL — ABNORMAL HIGH (ref 0.1–1.0)
Monocytes Relative: 11 %
Neutro Abs: 9 10*3/uL — ABNORMAL HIGH (ref 1.7–7.7)
Neutrophils Relative %: 77 %
Platelets: 205 10*3/uL (ref 150–400)
RBC: 5 MIL/uL (ref 4.22–5.81)
RDW: 13.1 % (ref 11.5–15.5)
WBC: 11.8 10*3/uL — ABNORMAL HIGH (ref 4.0–10.5)
nRBC: 0 % (ref 0.0–0.2)

## 2023-07-12 LAB — BASIC METABOLIC PANEL
Anion gap: 13 (ref 5–15)
BUN: 8 mg/dL (ref 8–23)
CO2: 23 mmol/L (ref 22–32)
Calcium: 8.7 mg/dL — ABNORMAL LOW (ref 8.9–10.3)
Chloride: 95 mmol/L — ABNORMAL LOW (ref 98–111)
Creatinine, Ser: 0.74 mg/dL (ref 0.61–1.24)
GFR, Estimated: >60 mL/min (ref >60)
Glucose, Bld: 123 mg/dL — ABNORMAL HIGH (ref 70–99)
Potassium: 3.8 mmol/L (ref 3.5–5.1)
Sodium: 131 mmol/L — ABNORMAL LOW (ref 135–145)

## 2023-07-12 LAB — MAGNESIUM: Magnesium: 1.6 mg/dL — ABNORMAL LOW (ref 1.7–2.4)

## 2023-07-12 LAB — TROPONIN I (HIGH SENSITIVITY): Troponin I (High Sensitivity): 7 ng/L (ref <18)

## 2023-07-12 MED ORDER — LISINOPRIL 20 MG PO TABS
40.0000 mg | ORAL_TABLET | Freq: Once | ORAL | Status: AC
  Administered 2023-07-12: 40 mg via ORAL
  Filled 2023-07-12: qty 2

## 2023-07-12 MED ORDER — ACETAMINOPHEN 500 MG PO TABS: 1000.0000 mg | ORAL_TABLET | Freq: Three times a day (TID) | ORAL | 0 refills | Status: AC

## 2023-07-12 MED ORDER — NAPROXEN 500 MG PO TABS: 500.0000 mg | ORAL_TABLET | Freq: Two times a day (BID) | ORAL | 0 refills | Status: AC

## 2023-07-12 MED ORDER — KETOROLAC TROMETHAMINE 15 MG/ML IJ SOLN
15.0000 mg | Freq: Once | INTRAMUSCULAR | Status: AC
  Administered 2023-07-12: 15 mg via INTRAMUSCULAR
  Filled 2023-07-12: qty 1

## 2023-07-12 NOTE — ED Triage Notes (Signed)
 Pt bib ems from home; discharged yesterday after back surgery of L4, L5 on Tuesday; c/o lumbar back pain; pt also c/o dizziness after taking oxycodone; pt states he feels disoriented; 168/87, 97 hr, 99% RA, cbg 155; pt answering questions appropriately; denies cp, denies sob

## 2023-07-12 NOTE — Discharge Instructions (Signed)
 For pain:  - Acetaminophen 1000 mg three times daily (every 8 hours) - Naproxen 2 times daily (every 12 hours) - oxycodone for breakthrough pain only

## 2023-07-12 NOTE — ED Provider Triage Note (Addendum)
 Emergency Medicine Provider Triage Evaluation Note  Kenneth Henson , a 83 y.o. male  was evaluated in triage.  Pt presenting for feelings of drowsiness and disorientation after taking two of his prescribed Norco's for low back pain at 830AM this morning. Daughter called ems for continued disorientation and complaints of dizziness. No repeat doses since. Pt states disorientation is improving but dizziness has continues. Denies sensation/motor dysfunction or speech changes. Hx pertinent L4-5 decompressive lumbar laminectomy 2 days on 07/10/23 with Dr. Christell Constant. Pt also presents with concerns for hyperglycemia. Hx of DM on metformin. No home insulin use. Poc glucose with paramedics was 150s. Pt also hypertensive on arriva at 156/78. He did not take his home dose of lisinopril 40 mg today. Will order.   Review of Systems  Positive: See above Negative: See above  Physical Exam  BP (!) 156/78   Pulse 94   Temp 98.4 F (36.9 C)   Resp 18   SpO2 94%  Gen:   Awake, no distress   Resp:  Normal effort  MSK:   Moves extremities without difficulty  Other:    Medical Decision Making  Medically screening exam initiated at 4:36 PM.  Appropriate orders placed.  ED MANDICH was informed that the remainder of the evaluation will be completed by another provider, this initial triage assessment does not replace that evaluation, and the importance of remaining in the ED until their evaluation is complete.  Labs ordered to r/o infection/ other etiologies. Symptoms however likely secondary to narcotic use. Will check insulin.    Franne Forts, DO 07/12/23 1642    Franne Forts, DO 07/12/23 1650

## 2023-07-12 NOTE — Telephone Encounter (Signed)
 Patient called requesting for a call back about his medication senna(senokot). Also patient advised to fill the medication mupriocin ointment to pharmacy. [phone]

## 2023-07-13 ENCOUNTER — Telehealth: Payer: Self-pay | Admitting: Radiology

## 2023-07-13 NOTE — Telephone Encounter (Signed)
 [name], patients daughter, called wanting clarification of the Mupirocin in the nose, states that they just got it last night and today was the first time that he has used it. She would like to know if he needs to stop it or if he should continue it and for how long should he use it? Call back number is [phone]

## 2023-07-13 NOTE — ED Provider Notes (Signed)
 [place] EMERGENCY DEPARTMENT AT [facility] Provider Note  CSN: [id number] Arrival date & time: 07/12/23 1558  Chief Complaint(s) Surgical Back Pain  HPI Kenneth Henson is a 83 y.o. male With PMH T2DM, HTN, HLD, CVA, PTSD, postop day 2 from L4-L5 laminectomy who presents emergency department for evaluation of dizziness and altered mental status.  States that since hospital discharge he has taken 10 mg oxycodone every 6 hours and has felt progressively lightheaded and confused.  His last dose was this morning and as this medication is started to wear off his back pain has returned but he feels that his symptoms are significantly improving otherwise.  Currently denies numbness, tingling, weakness or other neurologic complaints.  Denies chest pain, shortness of breath, headache, nausea, vomiting or other systemic symptoms.   Past Medical History Past Medical History:  Diagnosis Date   Abnormal involuntary movements(781.0)    Anginal pain (HCC)    30 years ago   Arthritis    Chicken pox    Claudication [facility])    Colon polyps    Diabetes mellitus type II    Erectile dysfunction    Hyperlipidemia    Hypertension    Neoplasm of skin    uncertain behavior   PTSD (post-traumatic stress disorder)    Sleep apnea    Stroke [facility])    Patient Active Problem List   Diagnosis Date Noted   Lumbar stenosis with neurogenic claudication 07/10/2023   Contact with and (suspected) exposure to other hazardous substances 06/14/2023   Preoperative clearance 06/14/2023   Neuropathy 06/14/2023   Major depressive disorder, recurrent, unspecified (HCC) 05/24/2023   Other specified problems related to psychosocial circumstances 05/24/2023   Spinal stenosis of lumbar region 02/27/2023   Insomnia 02/08/2023   Lipoma of neck 02/08/2023   Skin lesion 02/08/2023   Chronic bilateral low back pain with right-sided sciatica 12/07/2022   Prosthetic joint infection, subsequent encounter 10/19/2022    Medication management 10/04/2022   Status post reverse total arthroplasty of right shoulder 09/06/2022   Acute pain of right shoulder 08/11/2022   Synovial cyst of left popliteal space 08/11/2022   Fracture of left tibial plateau 08/06/2022   Need for influenza vaccination 03/01/2021   Pustular inflammation of skin 12/01/2020   Decreased visual acuity 12/01/2020   Moderately increased albuminuria 12/01/2020   Diabetes mellitus without complication (HCC) 10/27/2020   Vitreomacular adhesion of both eyes 10/27/2020   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 10/27/2020   Exudative age-related macular degeneration of left eye with active choroidal neovascularization (HCC) 10/27/2020   Diabetes (HCC) 09/01/2020   Retinal tear 09/01/2020   Headache 01/16/2012   Atypical nevus 11/07/2011   Trigger finger 05/22/2011   Bronchitis 05/22/2011   Preop cardiovascular exam 04/11/2011   Knee pain 02/24/2011   Bursitis of elbow 10/31/2010   OBESITY 04/28/2010   INCONTINENCE, URGE 04/28/2010   LEG PAIN, BILATERAL 12/08/2009   CHEST PAIN 12/08/2009   ELECTROCARDIOGRAM, ABNORMAL 12/08/2009   NEOPLASM, SKIN, UNCERTAIN BEHAVIOR 12/06/2009   PREMATURE ATRIAL CONTRACTIONS 12/06/2009   History of colonic polyps 11/17/2009   ERECTILE DYSFUNCTION, 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   Hyperlipidemia 09/29/2009   TOBACCO USE 09/29/2009   Essential hypertension 09/29/2009   SHOULDER, PAIN 09/29/2009   HIP PAIN, LEFT 09/29/2009   Abnormal involuntary movement 09/29/2009   Home Medication(s) Prior to Admission medications   Medication Sig Start Date End Date Taking? Authorizing Provider  acetaminophen (TYLENOL) 500 MG tablet Take 2 tablets (  1,000 mg total) by mouth every 8 (eight) hours. 07/12/23 08/11/23 Yes Rozelia Catapano, MD  naproxen (NAPROSYN) 500 MG tablet Take 1 tablet (500 mg total) by mouth 2 (two) times daily. 07/12/23  Yes Simar Pothier, MD   allopurinol (ZYLOPRIM) 300 MG tablet Take 300 mg by mouth every 12 (twelve) hours as needed (gout).    [provider]  amLODipine (NORVASC) 5 MG tablet Take 5 mg by mouth daily.    [provider]  aspirin EC 81 MG tablet Take 81 mg by mouth daily. Swallow whole.    [provider]  chlorhexidine (HIBICLENS) 4 % external liquid Apply 15 mLs (1 Application total) topically as directed for 30 doses. Use as directed daily for 5 days every other week for 6 weeks. 07/10/23   London Sheer, MD  diphenhydrAMINE (BENADRYL) 25 mg capsule Take 25 mg by mouth 2 (two) times daily as needed for itching (eczema).    [provider]  diphenhydrAMINE HCl, Sleep, (ZZZQUIL) 50 MG/30ML LIQD Take 50 mg by mouth daily as needed (sleep).    [provider]  gabapentin (NEURONTIN) 300 MG capsule Take 1 tab before bed x 2 weeks.  May increase to 1 tab at dinner for 2 weeks and then may increase to am, dinner and before bed thereafter for pain. Patient taking differently: Take 300 mg by mouth in the morning, at noon, and at bedtime. 04/02/23   de [place], Buren Kos, MD  HYDROcodone-acetaminophen (NORCO/VICODIN) 5-325 MG tablet Take 1-2 tablets by mouth every 4 (four) hours as needed for up to 7 days for moderate pain (pain score 4-6) or severe pain (pain score 7-10). 07/10/23 07/17/23  London Sheer, MD  lisinopril (PRINIVIL,ZESTRIL) 40 MG tablet Take 1 tablet (40 mg total) by mouth daily. 05/29/11   Sheliah Hatch, MD  MELATONIN PO Take 1 tablet by mouth at bedtime as needed (sleep).    [provider]  metFORMIN (GLUCOPHAGE) 850 MG tablet Take 850 mg by mouth daily. Takes it once in the morning, patient choice    [provider]  methocarbamol (ROBAXIN) 500 MG tablet Take 1 tablet (500 mg total) by mouth 4 (four) times daily. 07/10/23   London Sheer, MD  Multiple Vitamin (MULTIVITAMIN WITH MINERALS) TABS tablet Take 1 tablet by mouth daily. Centrum Silver  for Men 50+    [provider]  mupirocin ointment (BACTROBAN) 2 % Place 1 Application into the nose 2 (two) times daily for 60 doses. Use as directed 2 times daily for 5 days every other week for 6 weeks. 07/10/23 08/09/23  London Sheer, MD  polyethylene glycol powder (GLYCOLAX/MIRALAX) 17 GM/SCOOP powder Mix as directed and take 17 g by mouth daily for 14 days. 07/11/23 07/25/23  London Sheer, MD  senna (SENOKOT) 8.6 MG TABS tablet Take 1 tablet (8.6 mg total) by mouth 2 (two) times daily for 14 days. 07/10/23 07/24/23  London Sheer, MD  traZODone (DESYREL) 50 MG tablet Take 50 mg by mouth at bedtime.    [provider]  Past Surgical History Past Surgical History:  Procedure Laterality Date   CARPAL TUNNEL RELEASE     right   DECOMPRESSIVE LUMBAR LAMINECTOMY LEVEL 1 N/A 07/10/2023   Procedure: Lumbar  four-five DECOMPRESSIVE LUMBAR LAMINECTOMY LEVEL;  Surgeon: London Sheer, MD;  Location: [facility] OR;  Service: Orthopedics;  Laterality: N/A;   HAND SURGERY     KNEE SURGERY     LIPOMA EXCISION Right 04/25/2023   Procedure: EXCISION LIPOMA POSTERIOR RIGHT NECK;  Surgeon: Lysle Rubens, MD;  Location: [facility] OR;  Service: General;  Laterality: Right;  LMA   MINOR HARDWARE REMOVAL Right 09/06/2022   Procedure: MINOR HARDWARE REMOVAL;  Surgeon: Bjorn Pippin, MD;  Location: [facility] ORS;  Service: Orthopedics;  Laterality: Right;   NECK SURGERY     ORIF HUMERUS FRACTURE Right 09/06/2022   Procedure: OPEN REDUCTION INTERNAL FIXATION (ORIF) PROXIMAL HUMERUS FRACTURE;  Surgeon: Bjorn Pippin, MD;  Location: [facility] ORS;  Service: Orthopedics;  Laterality: Right;   SHOULDER SURGERY Right    2020   SHOULDER SURGERY Left    2011   TOTAL SHOULDER REVISION Right 09/06/2022   Procedure: TOTAL SHOULDER REVISION;  Surgeon: Bjorn Pippin, MD;  Location: [facility] ORS;   Service: Orthopedics;  Laterality: Right;   Family History Family History  Problem Relation Age of Onset   Hypertension Mother    Heart disease Mother    Stroke Father    Colon cancer Neg Hx    Stomach cancer Neg Hx    Breast cancer Sister    Colon polyps Sister     Social History Social History   Tobacco Use   Smoking status: Former    Current packs/day: 0.00    Types: Cigarettes    Quit date: 05/15/2009    Years since quitting: 14.1    Passive exposure: Past   Smokeless tobacco: Never  Vaping Use   Vaping status: Never Used  Substance Use Topics   Alcohol use: Yes    Alcohol/week: 1.0 standard drink of alcohol    Types: 1 Shots of liquor per week    Comment: vodka, weekly   Drug use: No   Allergies Doxycycline and Oxycodone-acetaminophen  Review of Systems Review of Systems  Musculoskeletal:  Positive for back pain.  Neurological:  Positive for dizziness.  Psychiatric/Behavioral:  Positive for confusion.     Physical Exam Vital Signs  I have reviewed the triage vital signs BP (!) 165/103 (BP Location: Right Arm)   Pulse 94   Temp 98.1 F (36.7 C) (Oral)   Resp 19   Ht 5\' 9"  (1.753 m)   Wt 118.7 kg   SpO2 93%   BMI 38.66 kg/m   Physical Exam Constitutional:      General: He is not in acute distress.    Appearance: Normal appearance.  HENT:     Head: Normocephalic and atraumatic.     Nose: No congestion or rhinorrhea.  Eyes:     General:        Right eye: No discharge.        Left eye: No discharge.     Extraocular Movements: Extraocular movements intact.     Pupils: Pupils are equal, round, and reactive to light.  Cardiovascular:     Rate and Rhythm: Normal rate and regular rhythm.     Heart sounds: No murmur heard. Pulmonary:     Effort: No respiratory distress.     Breath sounds: No wheezing or rales.  Abdominal:  General: There is no distension.     Tenderness: There is no abdominal tenderness.  Musculoskeletal:        General:  Tenderness present. Normal range of motion.     Cervical back: Normal range of motion.  Skin:    General: Skin is warm and dry.  Neurological:     General: No focal deficit present.     Mental Status: He is alert.     Cranial Nerves: No cranial nerve deficit.     Sensory: No sensory deficit.     Motor: No weakness.     ED Results and Treatments Labs (all labs ordered are listed, but only abnormal results are displayed) Labs Reviewed  CBC WITH DIFFERENTIAL/PLATELET - Abnormal; Notable for the following components:      Result Value   WBC 11.8 (*)    Neutro Abs 9.0 (*)    Monocytes Absolute 1.3 (*)    All other components within normal limits  BASIC METABOLIC PANEL - Abnormal; Notable for the following components:   Sodium 131 (*)    Chloride 95 (*)    Glucose, Bld 123 (*)    Calcium 8.7 (*)    All other components within normal limits  MAGNESIUM - Abnormal; Notable for the following components:   Magnesium 1.6 (*)    All other components within normal limits  TROPONIN I (HIGH SENSITIVITY)                                                                                                                          Radiology DG Chest Port 1 View Result Date: 07/12/2023 CLINICAL DATA:  Dizziness and back pain. Lumbar surgery on Monday of this week. Unable to lay supine due to pain levels. Due to pain levels and inability to lay supine, the costophrenic angles were unable to be imaged. EXAM: PORTABLE CHEST 1 VIEW COMPARISON:  08/10/2022 FINDINGS: Stable cardiomegaly given differences in technique and patient rotation. Aortic atherosclerotic calcification. Pulmonary vascular congestion. The lower lungs and costophrenic angles were unable to be included in the image. No visualized focal consolidation. No pneumothorax. Bilateral reverse TSA. No displaced rib fractures. IMPRESSION: Cardiomegaly with pulmonary vascular congestion. Electronically Signed   By: Minerva Fester M.D.   On: 07/12/2023  20:26    Pertinent labs & imaging results that were available during my care of the patient were reviewed by me and considered in my medical decision making (see MDM for details).  Medications Ordered in ED Medications  lisinopril (ZESTRIL) tablet 40 mg (40 mg Oral Given 07/12/23 1655)  ketorolac (TORADOL) 15 MG/ML injection 15 mg (15 mg Intramuscular Given 07/12/23 1932)  Procedures Procedures  (including critical care time)  Medical Decision Making / ED Course   This patient presents to the ED for concern of back pain, confusion, dizziness, this involves an extensive number of treatment options, and is a complaint that carries with it a high risk of complications and morbidity.  The differential diagnosis includes polypharmacy, dehydration, electrolyte abnormality, appropriate postoperative pain  MDM: Patient seen emergency room for evaluation of dizziness and altered mental status.  Physical exam reveals a well-healing surgical incision site in the lumbar spine but is otherwise unremarkable.  Neurologic exam unremarkable.  Patient appears to have returned to normal mental status baseline as oxycodone has worn off.  Laboratory evaluation with some mild hyponatremia to 131, leukocytosis to 11.8 likely post reactive in the setting of surgery, high-sensitivity troponin is normal, magnesium 1.6.  Chest x-ray with pulmonary vascular congestion and cardiomegaly but patient is not hypoxic and has no shortness of breath or accessory muscle use today.  Suspect that patient's symptoms are likely secondary to polypharmacy in the setting of high-dose oxycodone use post operatively.  I had an extensive discussion with the patient and his daughter about appropriate pain control methods and patient will start a regimen of Tylenol, Naprosyn and oxycodone for breakthrough pain only.   Patient received single dose Toradol here in the emergency department with symptomatic improvement.  At this time he does not meet inpatient criteria for admission and will be discharged with outpatient follow-up.  Return precautions given of which he voiced understanding   Additional history obtained: -Additional history obtained from daughter -External records from outside source obtained and reviewed including: Chart review including previous notes, labs, imaging, consultation notes   Lab Tests: -I ordered, reviewed, and interpreted labs.   The pertinent results include:   Labs Reviewed  CBC WITH DIFFERENTIAL/PLATELET - Abnormal; Notable for the following components:      Result Value   WBC 11.8 (*)    Neutro Abs 9.0 (*)    Monocytes Absolute 1.3 (*)    All other components within normal limits  BASIC METABOLIC PANEL - Abnormal; Notable for the following components:   Sodium 131 (*)    Chloride 95 (*)    Glucose, Bld 123 (*)    Calcium 8.7 (*)    All other components within normal limits  MAGNESIUM - Abnormal; Notable for the following components:   Magnesium 1.6 (*)    All other components within normal limits  TROPONIN I (HIGH SENSITIVITY)       Imaging Studies ordered: I ordered imaging studies including chest x-ray I independently visualized and interpreted imaging. I agree with the radiologist interpretation   Medicines ordered and prescription drug management: Meds ordered this encounter  Medications   lisinopril (ZESTRIL) tablet 40 mg   ketorolac (TORADOL) 15 MG/ML injection 15 mg   naproxen (NAPROSYN) 500 MG tablet    Sig: Take 1 tablet (500 mg total) by mouth 2 (two) times daily.    Dispense:  30 tablet    Refill:  0   acetaminophen (TYLENOL) 500 MG tablet    Sig: Take 2 tablets (1,000 mg total) by mouth every 8 (eight) hours.    Dispense:  180 tablet    Refill:  0    -I have reviewed the patients home medicines and have made adjustments as  needed  Critical interventions none    Cardiac Monitoring: The patient was maintained on a cardiac monitor.  I personally viewed and interpreted the cardiac monitored which  showed an underlying rhythm of: NSR  Social Determinants of Health:  Factors impacting patients care include: none   Reevaluation: After the interventions noted above, I reevaluated the patient and found that they have :improved  Co morbidities that complicate the patient evaluation  Past Medical History:  Diagnosis Date   Abnormal involuntary movements(781.0)    Anginal pain (HCC)    30 years ago   Arthritis    Chicken pox    Claudication [facility])    Colon polyps    Diabetes mellitus type II    Erectile dysfunction    Hyperlipidemia    Hypertension    Neoplasm of skin    uncertain behavior   PTSD (post-traumatic stress disorder)    Sleep apnea    Stroke [facility])       Dispostion: I considered admission for this patient, but at this time he does not meet inpatient criteria for admission and will be discharged with outpatient follow-up     Final Clinical Impression(s) / ED Diagnoses Final diagnoses:  Polypharmacy  Opioid use  Dizziness     @PCDICTATION @    Amauri Medellin, Wyn Forster, MD 07/13/23 [phone]

## 2023-07-13 NOTE — Telephone Encounter (Signed)
 I called and spoke with patient, he has no idea what the question is about the Senakot, but I did advise him that he no longer has to put the mupirocin in his nose. [facility] to being disorientated due to taking to much pain medication.

## 2023-07-16 ENCOUNTER — Telehealth: Payer: Self-pay | Admitting: Radiology

## 2023-07-16 ENCOUNTER — Encounter: Payer: Medicare Other | Admitting: Orthopedic Surgery

## 2023-07-16 ENCOUNTER — Telehealth: Payer: Self-pay | Admitting: Orthopedic Surgery

## 2023-07-16 NOTE — Telephone Encounter (Signed)
 Pt called requesting for DR Christell Constant to call him. He knows his daughter called but prefers the call to come to him. Pt states bandages.

## 2023-07-16 NOTE — Telephone Encounter (Signed)
 I spoke with Dr. Christell Constant, he states that since tomorrow is 1 week postop that he can take off the dressing and as long as there is no drainage he can leave it off, otherwise if there is drainage that we will need to see him.   I called and advised patient of the message from Dr. Christell Constant and he states that there is no drainage at this time, I did advise that if he develops drainage that he needs to come into the office and be seen, and he states that he will call us if things change.   We will plan to see him in the office as scheduled on 07/23/23 @ 1045.

## 2023-07-16 NOTE — Telephone Encounter (Signed)
 Patients daughter left voicemail on triage.   States patients bandage has completely come off and would like to be see today to have new bandage/check.  07/10/23 L4-5 decompression  Please call [phone] Patient does have someone on standby to bring into office per daughters message.

## 2023-07-16 NOTE — Telephone Encounter (Signed)
 I called and lmom advising [name] of Dr. Kathi Der message

## 2023-07-23 ENCOUNTER — Ambulatory Visit (INDEPENDENT_AMBULATORY_CARE_PROVIDER_SITE_OTHER): Payer: Medicare Other | Admitting: Orthopedic Surgery

## 2023-07-23 DIAGNOSIS — Z9889 Other specified postprocedural states: Secondary | ICD-10-CM

## 2023-07-23 NOTE — Progress Notes (Signed)
 Orthopedic Surgery Post-operative Office Visit  Procedure: L4/5 laminectomy Date of Surgery: 07/10/2023 (~2 weeks post-op)  Assessment: Patient is a 83 y.o. whose leg pain has resolved since surgery.  Back pain slowly improving   Plan: -Operative plans complete -Out of bed as tolerated, no brace -No bending/lifting/twisting greater than 10 pounds -Will start home health PT after our next visit -Pain management: Naproxen -Return to office in 4 weeks, lumbar x-rays needed at next visit: None  ___________________________________________________________________________   Subjective: Patient has been home since discharge from the hospital.  His leg pain has resolved since surgery.  His back pain is slowly been getting better.  He initially was using Norco to control the pain but is now taking naproxen about once per day.  Is not using any other medications for pain.  Has not noticed any redness or drainage around his incision.  Objective:  General: no acute distress, appropriate affect Neurologic: alert, answering questions appropriately, following commands Respiratory: unlabored breathing on room air Skin: incision is well-approximated with no erythema, induration, active/expressible drainage  MSK (spine):  -Strength exam      Left  Right  EHL    5/5  5/5 TA    5/5  5/5 GSC    5/5  5/5 Knee extension  5/5  5/5 Hip flexion   5/5  5/5  -Sensory exam    Sensation intact to light touch in L2-S1 nerve distributions of bilateral lower extremities  Imaging: None obtained at today's visit   Patient name: Kenneth Henson Patient MRN: [id number] Date of visit: 07/23/23

## 2023-07-30 ENCOUNTER — Encounter: Payer: Self-pay | Admitting: Neurology

## 2023-07-30 ENCOUNTER — Ambulatory Visit (INDEPENDENT_AMBULATORY_CARE_PROVIDER_SITE_OTHER): Payer: Medicare Other | Admitting: Neurology

## 2023-07-30 VITALS — BP 172/90 | HR 89 | Ht 69.0 in | Wt 262.0 lb

## 2023-07-30 DIAGNOSIS — G621 Alcoholic polyneuropathy: Secondary | ICD-10-CM | POA: Diagnosis not present

## 2023-07-30 DIAGNOSIS — E1142 Type 2 diabetes mellitus with diabetic polyneuropathy: Secondary | ICD-10-CM | POA: Diagnosis not present

## 2023-07-30 MED ORDER — DULOXETINE HCL 30 MG PO CPEP: 30.0000 mg | ORAL_CAPSULE | Freq: Every evening | ORAL | 5 refills | Status: DC | PRN

## 2023-07-30 NOTE — Progress Notes (Signed)
 [facility] Clinic Note - Initial Visit   Date: 07/30/2023   Kenneth Henson MRN: [id number] DOB: 1940/11/27   Dear Dr. Marcy Salvo de [place]:  Thank you for your kind referral of Kenneth Henson for consultation of neuropathy. Although his history is well known to you, please allow [place] to reiterate it for the purpose of our medical record. The patient was accompanied to the clinic by self.    Kenneth Henson is a 83 y.o. 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   IMPRESSION/PLAN: Painful neuropathy affecting the feet manifesting with distal paresthesia and sensory ataxia.  Risk factors:  diabetes mellitus, alcohol  - Check vitamin B12, folate, vitamin B1, SPEP with IFE, copper  - Continue gabapentin 1200mg /d (300-300-600)  - Start duloxetine 30mg  at bedtime  - Patient educated on daily foot inspection, fall prevention, and safety precautions around the home.  - He will be start PT next month  - Always use a cane/walker  - Encouraged to reduce alcohol consumption.  He is doing well with diabetes management  Return to clinic in 4 months  ------------------------------------------------------------- History of present illness: Starting around 2022, he began having burning, stabbing, and stinging pain involving the feet.  It is worse in the evening.  He endorses imbalance and uses a cane and walker for the past few years.  He has not had any falls.  He takes gabapentin 300mg  in the morning, 300mg  in the afternoon, and 600mg  at bedtime; however, he still continues to have pain in the evening.  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  He will be start PT next month.    Nonsmoker.  He was drinking a pint of liquor per week x 25 years, which he reduced to about 2 -4 beverages per week.    Out-side paper records, electronic medical record, and images have been reviewed where available and summarized as:  Lab Results  Component Value Date   HGBA1C 7.3 (A) 06/14/2023   HGBA1C 7.3 06/14/2023    Lab Results  Component Value Date   TSH 0.88 02/24/2011   Lab Results  Component Value Date   ESRSEDRATE 17 02/27/2023    Past Medical History:  Diagnosis Date   Abnormal involuntary movements(781.0)    Anginal pain (HCC)    30 years ago   Arthritis    Chicken pox    Claudication (HCC)    Colon polyps    Diabetes mellitus type II    Erectile dysfunction    Hyperlipidemia    Hypertension    Neoplasm of skin    uncertain behavior   PTSD (post-traumatic stress disorder)    Sleep apnea    Stroke [facility])     Past Surgical History:  Procedure Laterality Date   CARPAL TUNNEL RELEASE     right   DECOMPRESSIVE LUMBAR LAMINECTOMY LEVEL 1 N/A 07/10/2023   Procedure: Lumbar  four-five DECOMPRESSIVE LUMBAR LAMINECTOMY LEVEL;  Surgeon: London Sheer, MD;  Location: [facility] OR;  Service: Orthopedics;  Laterality: N/A;   HAND SURGERY     KNEE SURGERY     LIPOMA EXCISION Right 04/25/2023   Procedure: EXCISION LIPOMA POSTERIOR RIGHT NECK;  Surgeon: Lysle Rubens, MD;  Location: [facility] OR;  Service: General;  Laterality: Right;  LMA   MINOR HARDWARE REMOVAL Right 09/06/2022   Procedure: MINOR HARDWARE REMOVAL;  Surgeon:  Bjorn Pippin, MD;  Location: [facility] ORS;  Service: Orthopedics;  Laterality: Right;   NECK SURGERY     ORIF HUMERUS FRACTURE Right 09/06/2022   Procedure: OPEN REDUCTION INTERNAL FIXATION (ORIF) PROXIMAL HUMERUS FRACTURE;  Surgeon: Bjorn Pippin, MD;  Location: [facility] ORS;  Service: Orthopedics;  Laterality: Right;   SHOULDER SURGERY Right    2020   SHOULDER SURGERY Left    2011   TOTAL SHOULDER REVISION Right 09/06/2022   Procedure: TOTAL SHOULDER REVISION;  Surgeon: Bjorn Pippin, MD;  Location: [facility] ORS;  Service: Orthopedics;  Laterality: Right;     Medications:   Outpatient Encounter Medications as of 07/30/2023  Medication Sig Note   acetaminophen (TYLENOL) 500 MG tablet Take 2 tablets (1,000 mg total) by mouth every 8 (eight) hours.    allopurinol (ZYLOPRIM) 300 MG tablet Take 300 mg by mouth every 12 (twelve) hours as needed (gout).    amLODipine (NORVASC) 5 MG tablet Take 5 mg by mouth daily.    aspirin EC 81 MG tablet Take 81 mg by mouth daily. Swallow whole. 06/29/2023: On hold due to upcoming procedure   chlorhexidine (HIBICLENS) 4 % external liquid Apply 15 mLs (1 Application total) topically as directed for 30 doses. Use as directed daily for 5 days every other week for 6 weeks.    diphenhydrAMINE (BENADRYL) 25 mg capsule Take 25 mg by mouth 2 (two) times daily as needed for itching (eczema).    [Paused] diphenhydrAMINE HCl, Sleep, (ZZZQUIL) 50 MG/30ML LIQD Take 50 mg by mouth daily as needed (sleep).    gabapentin (NEURONTIN) 300 MG capsule Take 1 tab before bed x 2 weeks.  May increase to 1 tab at dinner for 2 weeks and then may increase to am, dinner and before bed thereafter for pain. (Patient taking differently: Take 300 mg by mouth QID.)    lisinopril (PRINIVIL,ZESTRIL) 40 MG tablet Take 1 tablet (40 mg total) by mouth daily.    MELATONIN PO Take 1 tablet by mouth at bedtime as needed (sleep).    metFORMIN (GLUCOPHAGE) 850 MG tablet Take 850 mg by mouth daily. Takes it once in the morning, patient choice    methocarbamol (ROBAXIN) 500 MG tablet Take 1 tablet (500 mg total) by mouth 4 (four) times daily.    Multiple Vitamin (MULTIVITAMIN WITH MINERALS) TABS tablet Take 1 tablet by mouth daily. Centrum Silver for Men 50+    mupirocin ointment (BACTROBAN) 2 % Place 1 Application into the nose 2 (two) times daily for 60 doses. Use as directed 2 times daily for 5 days every other week for 6 weeks.    naproxen (NAPROSYN) 500 MG tablet Take 1 tablet (500 mg total) by mouth 2 (two) times daily.    traZODone (DESYREL) 50 MG tablet Take 50 mg by mouth  at bedtime.    No facility-administered encounter medications on file as of 07/30/2023.    Allergies:  Allergies  Allergen Reactions   Doxycycline Nausea And Vomiting   Oxycodone-Acetaminophen Itching    Family History: Family History  Problem Relation Age of Onset   Hypertension Mother    Heart disease Mother    Stroke Father    Breast cancer Sister    Colon polyps Sister    Colon cancer Neg Hx    Stomach cancer Neg Hx     Social History: Social History   Tobacco Use   Smoking status: Former    Current packs/day: 0.00    Types: Cigarettes  Quit date: 05/15/2009    Years since quitting: 14.2    Passive exposure: Past   Smokeless tobacco: Never  Vaping Use   Vaping status: Never Used  Substance Use Topics   Alcohol use: Yes    Alcohol/week: 1.0 standard drink of alcohol    Types: 1 Shots of liquor per week    Comment: vodka, weekly   Drug use: No   Social History   Social History Narrative   Are you right handed or left handed? Right Handed    Are you currently employed ? No    What is your current occupation? Retired    Do you live at home alone? Yes   Who lives with you?    What type of home do you live in: 1 story or 2 story? Lives in a one story home. Daughter and son-law checks on him daily         Vital Signs:  BP (!) 172/90   Pulse 89   Ht 5\' 9"  (1.753 m)   Wt 262 lb (118.8 kg)   SpO2 95%   BMI 38.69 kg/m    Neurological Exam: MENTAL STATUS including orientation to time, place, person, recent and remote memory, attention span and concentration, language, and fund of knowledge is normal.  Speech is not dysarthric.  CRANIAL NERVES: II:  No visual field defects.     III-IV-VI: Pupils equal round and reactive to light.  Normal conjugate, extra-ocular eye movements in all directions of gaze.  No nystagmus.  No ptosis.   V:  Normal facial sensation.    VII:  Normal facial symmetry and movements.   VIII:  Normal hearing and vestibular function.    IX-X:  Normal palatal movement.   XI:  Normal shoulder shrug and head rotation.   XII:  Normal tongue strength and range of motion, no deviation or fasciculation.  MOTOR:  No atrophy, fasciculations or abnormal movements.  No pronator drift.   Upper Extremity:  Right  Left  Deltoid  5/5   5/5   Biceps  5/5   5/5   Triceps  5/5   5/5   Wrist extensors  5/5   5/5   Wrist flexors  5/5   5/5   Finger extensors  5/5   5/5   Finger flexors  5/5   5/5   Dorsal interossei  5/5   5/5   Abductor pollicis  5/5   5/5   Tone (Ashworth scale)  0  0   Lower Extremity:  Right  Left  Hip flexors  5/5   5/5   Knee flexors  5/5   5/5   Knee extensors  5/5   5/5   Dorsiflexors  5/5   5/5   Plantarflexors  5/5   5/5   Toe extensors  5/5   5/5   Toe flexors  5/5   5/5   Tone (Ashworth scale)  0  0   MSRs:                                           Right        Left brachioradialis 2+  2+  biceps 2+  2+  triceps 2+  2+  patellar 2+  2+  ankle jerk 0  0  Hoffman no  no  plantar response down  down   SENSORY:  Temperature, pin prick, and vibration diminished in the feet bilaterally.  Vibration intact at ankles. Romberg's sign positive.   COORDINATION/GAIT: Normal finger-to- nose-finger.  Intact rapid alternating movements bilaterally.  Gait wide-based, assisted with cane, stable.    Thank you for allowing me to participate in patient's care.  If I can answer any additional questions, I would be pleased to do so.    Sincerely,    Elexius Minar K. Allena Katz, DO

## 2023-07-30 NOTE — Patient Instructions (Signed)
 Start duloxetine 30mg  at bedtime  Continue gabapentin as you are taking  Check labs  Check your feet daily  Take extra care on uneven ground

## 2023-08-07 IMAGING — CT CT SHOULDER*L* W/O CM
3 series · 13 of 34 positions shown, 16 images · non-contrast
Comparison: X-ray 10/18/2021, CT 04/01/2010

CLINICAL DATA: Left shoulder pain.  No known recent injury

EXAM:
CT OF THE UPPER LEFT EXTREMITY WITHOUT CONTRAST
TECHNIQUE: Multidetector CT imaging of the upper left extremity was performed
according to the standard protocol.
RADIATION DOSE REDUCTION: This exam was performed according to the
departmental dose-optimization program which includes automated
exposure control, adjustment of the mA and/or kV according to
patient size and/or use of iterative reconstruction technique.

[Series 6: axial soft · axial · 0.53mm/px · z∈[+688,+824]mm · 5 of 98 slices shown, 7 images]
[im 15/98  soft-tissue]
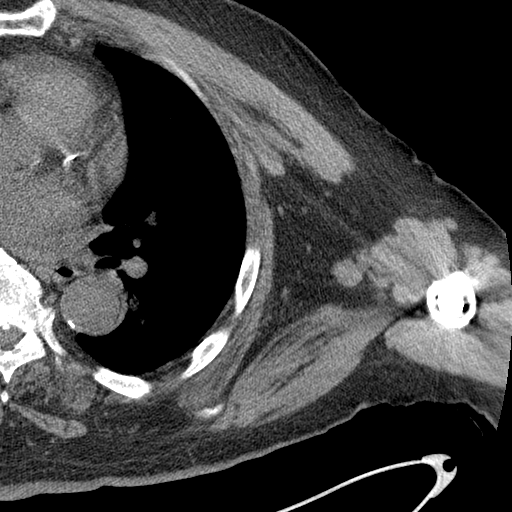
[im 15/98  bone]
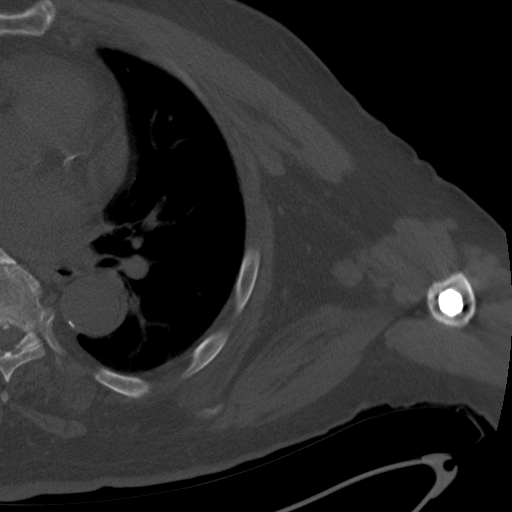
[im 30/98  bone]
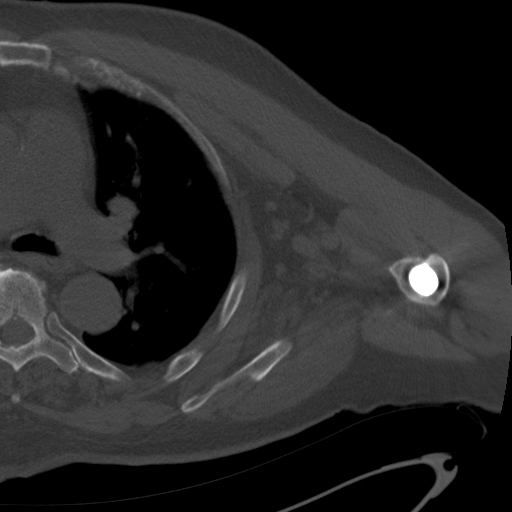
[im 53/98  bone]
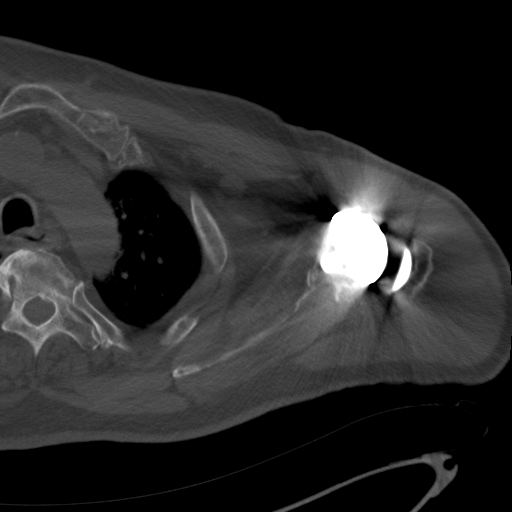
[im 68/98  bone]
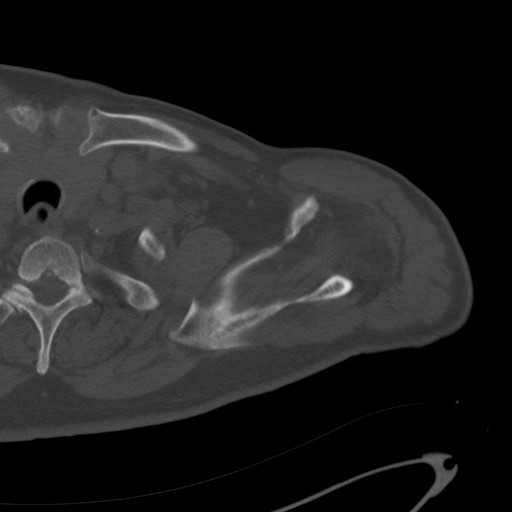
[im 83/98  soft-tissue]
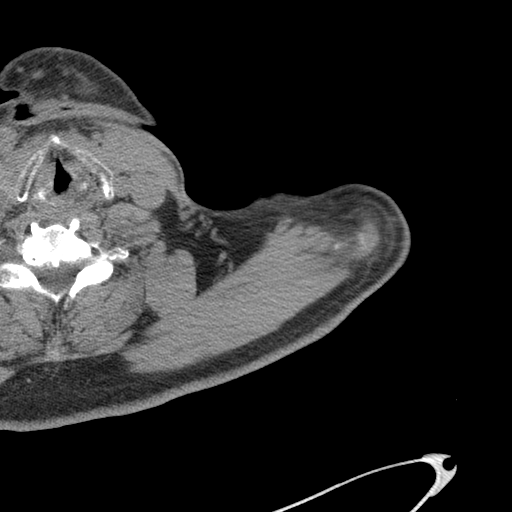
[im 83/98  bone]
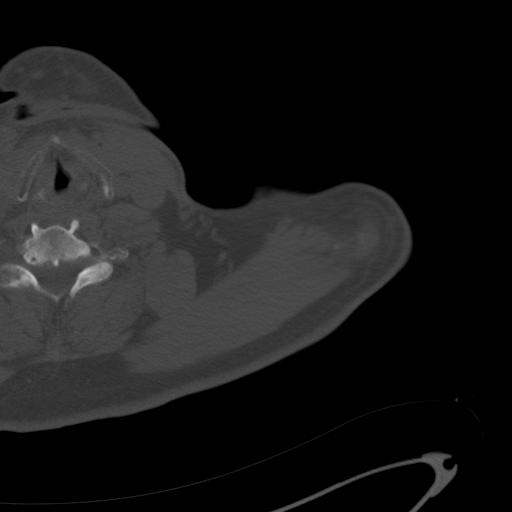

[Series 8: sagittal soft · sagittal · 0.30mm/px · 5 of 120 slices shown, 6 images]
[im 46/120  bone]
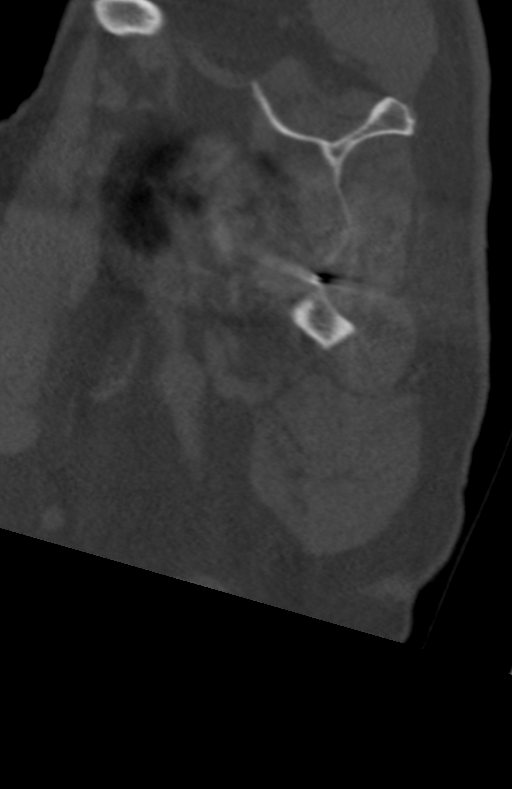
[im 53/120  bone]
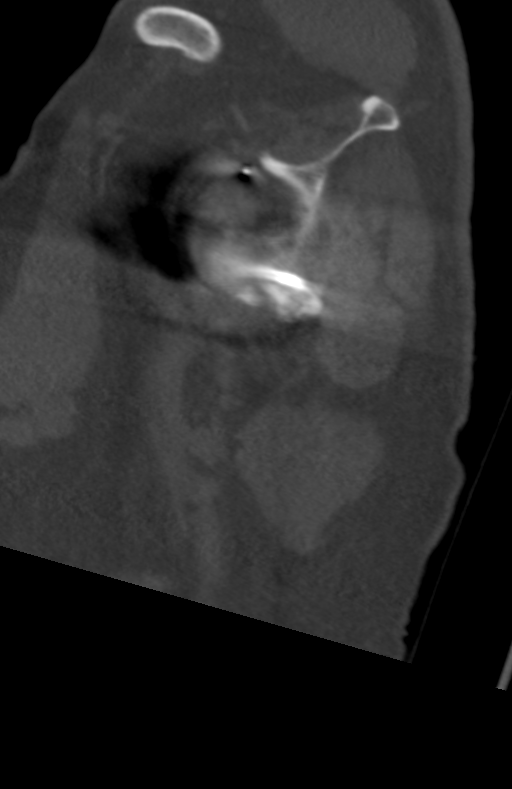
[im 60/120  soft-tissue]
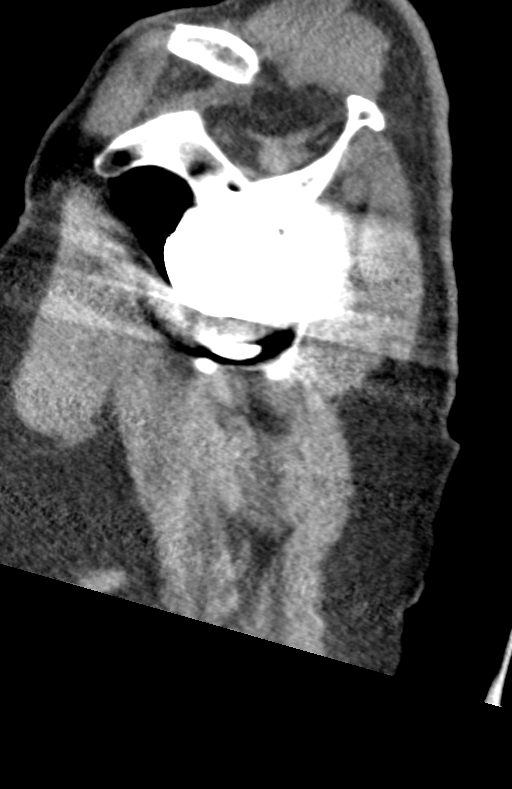
[im 60/120  bone]
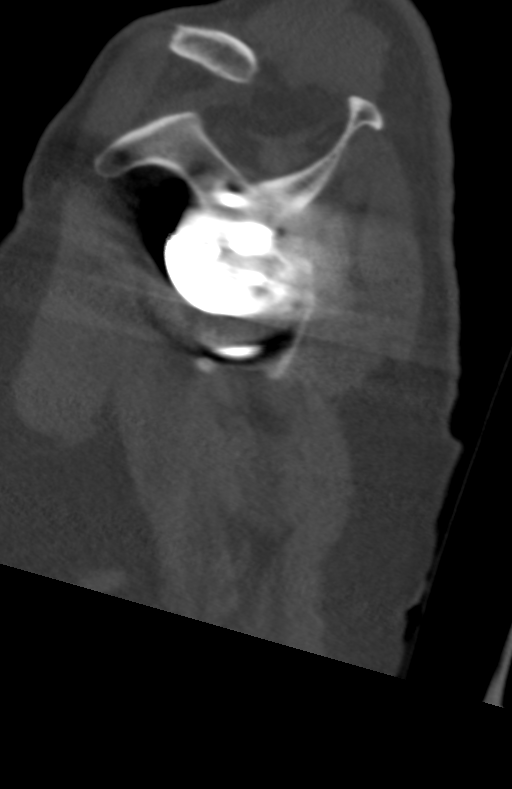
[im 67/120  bone]
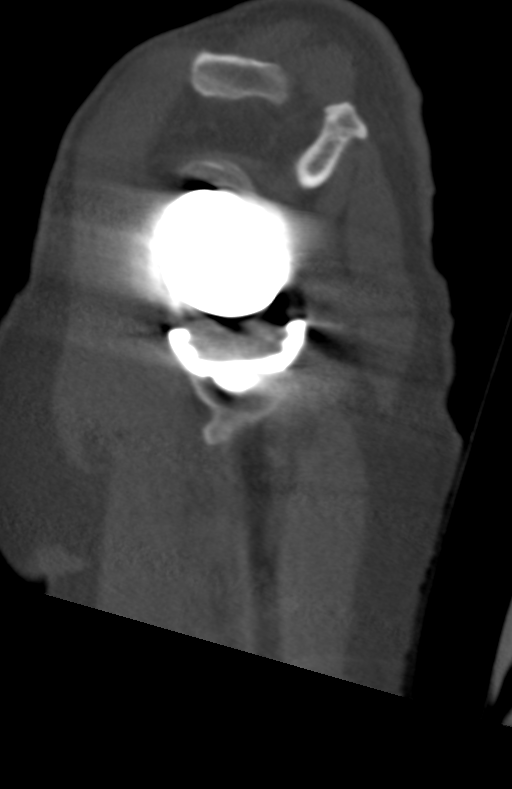
[im 74/120  bone]
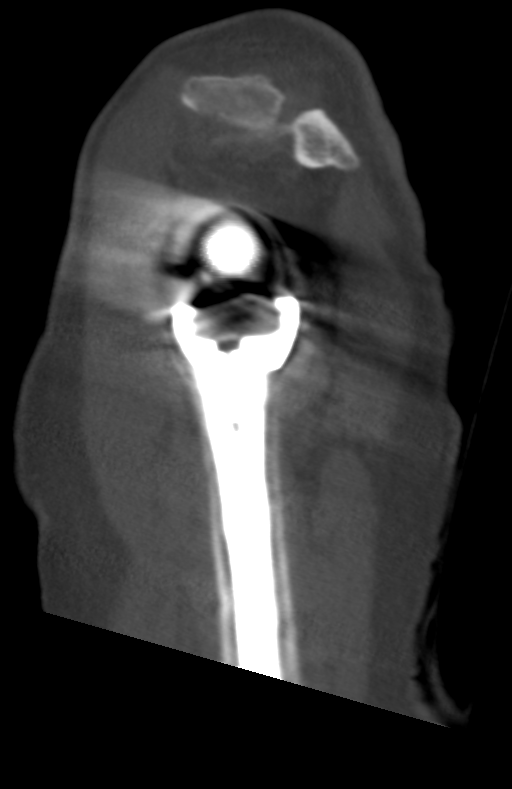

[Series 9: cor soft · coronal · 0.47mm/px · 3 of 78 slices shown]
[im 16/78  bone]
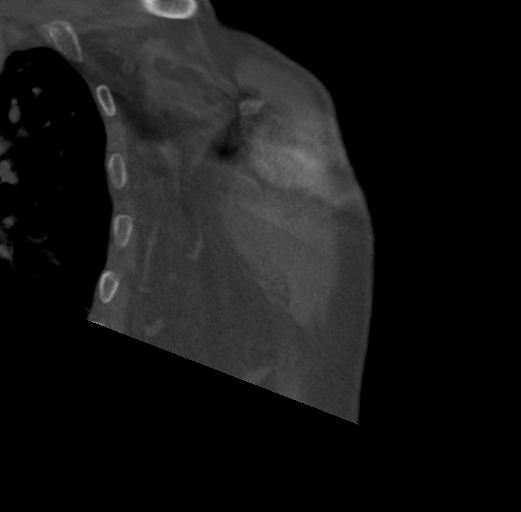
[im 31/78  bone]
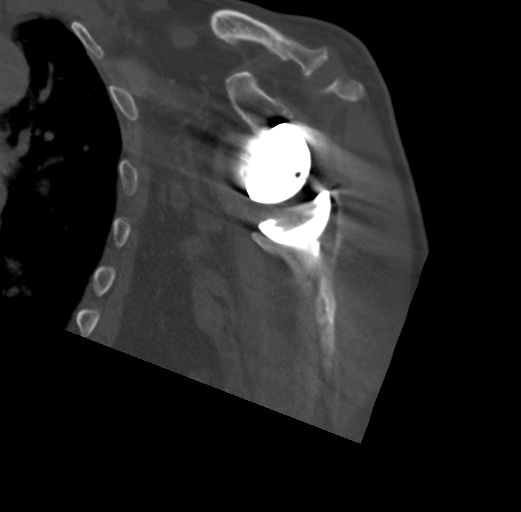
[im 47/78  bone]
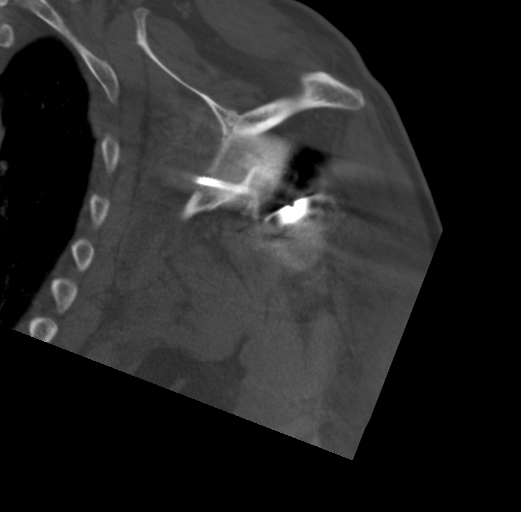

[13 of 34 positions shown; findings below may reference images not displayed]

FINDINGS: Bones/Joint/Cartilage

Postsurgical changes status post reverse left shoulder arthroplasty.
Chronic appearing fracture defect along the posteroinferior glenoid
rim with corticated margins (series 4, image 53). Corticated osseous
fragment along the inferior glenoid rim extending into the axillary
pouch measuring 2.0 x 0.6 x 1.9 cm (series 4, image 58; series 12,
image 37). No periprosthetic fluid collection. No evidence of an
acute fracture. AC joint intact with moderate osteoarthritis. No
evidence of stress changes of the acromion. Remaining included
osseous structures appear intact.

Ligaments

Suboptimally assessed by CT.

Muscles and Tendons

Mild rotator cuff muscle atrophy. No acute musculotendinous
abnormality by CT.

Soft tissues

No soft tissue swelling or fluid collection about the left shoulder.
No left axillary lymphadenopathy. Aortic and coronary artery
atherosclerotic calcifications are seen. Included portion of the
left lung field demonstrates no significant findings.
IMPRESSION: 1. Postsurgical changes status post reverse left shoulder
arthroplasty without evidence of hardware loosening or acute
fracture.
2. Chronic-appearing fracture defect along the posteroinferior
glenoid rim. Corticated osseous fragment along the inferior glenoid
rim extending into the axillary pouch measuring up to 2.0 cm.
3. Aortic and coronary artery atherosclerosis ([id number]).

## 2023-08-14 ENCOUNTER — Encounter (HOSPITAL_BASED_OUTPATIENT_CLINIC_OR_DEPARTMENT_OTHER): Payer: Self-pay | Admitting: *Deleted

## 2023-08-20 ENCOUNTER — Ambulatory Visit (INDEPENDENT_AMBULATORY_CARE_PROVIDER_SITE_OTHER): Admitting: Orthopedic Surgery

## 2023-08-20 ENCOUNTER — Other Ambulatory Visit (INDEPENDENT_AMBULATORY_CARE_PROVIDER_SITE_OTHER)

## 2023-08-20 ENCOUNTER — Encounter: Payer: Self-pay | Admitting: Orthopedic Surgery

## 2023-08-20 DIAGNOSIS — Z9889 Other specified postprocedural states: Secondary | ICD-10-CM

## 2023-08-20 DIAGNOSIS — M25561 Pain in right knee: Secondary | ICD-10-CM

## 2023-08-20 NOTE — Progress Notes (Addendum)
 Orthopedic Surgery Post-operative Office Visit   Procedure: L4/5 laminectomy Date of Surgery: 07/10/2023 (~6 weeks post-op)   Assessment: Patient is a 83 y.o. whose leg pain has resolved since surgery.  Back pain similar to last visit. Also reporting right knee pain consistent with OA     Plan: -Operative plans complete -No spine specific precautions -Ordered home health PT  -For his knee, discussed injection as an option. He has done them in the past and has found them helpful. Last one was in Oct 2024 -Pain management: Naproxen -Return to office in 6 weeks, x-rays needed at next visit: AP/lateral/flex/ex lumbar  Right knee intraarticular injection note: After discussing the risks, benefits, alternatives of right knee injection, patient elected to proceed.  The patient was in the seated position with the knee at 90 degrees.  The anterior lateral soft spot was prepped with an alcohol based prep.  Ethyl chloride was used to anesthetize the skin.  A 20-gauge needle was used to inject 1 cc of lidocaine, 1 cc of bupivacaine, 1 cc of Depo-Medrol into the intra-articular space using standard sterile technique.  Needle was withdrawn and Band-Aid was applied.  Patient tolerated the procedure well.   ___________________________________________________________________________     Subjective: Patient has not had any return of the radiating leg pain since he was last seen.  He states he is doing well from that standpoint.  He still having significant back pain.  He is also reporting right knee pain.  He said he has been doing some exercises at home and has developed right knee pain.  He has previously gotten injections in the knee and found them helpful.  He notes the pain is worse with weightbearing.  He has not noticed any redness or drainage around his incision.   Objective:   General: no acute distress, appropriate affect Neurologic: alert, answering questions appropriately, following  commands Respiratory: unlabored breathing on room air Skin: incision is well healed with no erythema, induration, active/expressible drainage   MSK (spine):   -Strength exam                                                   Left                  Right   EHL                              5/5                  5/5 TA                                 5/5                  5/5 GSC                             5/5                  5/5 Knee extension            5/5                  5/5  Hip flexion                    5/5                  5/5   -Sensory exam                           Sensation intact to light touch in L2-S1 nerve distributions of bilateral lower extremities   Imaging: XRs of the right knee from 08/20/2023 were independent reviewed and interpreted, showing medial joint space narrowing with osteophyte formation and subchondral sclerosis.  Osteophytes noted within the patellofemoral compartment as well.  No fracture or dislocation seen.     Patient name: Kenneth Henson Patient MRN: [id number] Date of visit: 08/20/23

## 2023-08-28 ENCOUNTER — Telehealth: Payer: Self-pay | Admitting: Orthopedic Surgery

## 2023-08-28 NOTE — Telephone Encounter (Signed)
 Charri from [facility] called and wanted you to know that the patient wants to know why do he needs home therapy? She stated that the patient said he was walking good on his own. CB#[phone]

## 2023-08-29 ENCOUNTER — Telehealth: Payer: Self-pay | Admitting: Orthopedic Surgery

## 2023-08-29 NOTE — Telephone Encounter (Signed)
 Charrisse (PT0 from [facility] called with instructions from pt. She states pt stated not to need physical therapy. Charrisse secure number is [phone].

## 2023-08-29 NOTE — Telephone Encounter (Signed)
 I called and advise Charri of Dr. Frieda Jew message, and she said ok

## 2023-08-30 NOTE — Telephone Encounter (Signed)
 Called [employer]

## 2023-09-03 LAB — HM DIABETES EYE EXAM

## 2023-09-04 ENCOUNTER — Encounter (HOSPITAL_BASED_OUTPATIENT_CLINIC_OR_DEPARTMENT_OTHER): Payer: Self-pay | Admitting: Family Medicine

## 2023-09-04 ENCOUNTER — Ambulatory Visit (INDEPENDENT_AMBULATORY_CARE_PROVIDER_SITE_OTHER): Payer: Medicare Other | Admitting: Family Medicine

## 2023-09-04 VITALS — BP 138/66 | HR 66 | Ht 68.0 in | Wt 262.0 lb

## 2023-09-04 DIAGNOSIS — E1140 Type 2 diabetes mellitus with diabetic neuropathy, unspecified: Secondary | ICD-10-CM | POA: Insufficient documentation

## 2023-09-04 DIAGNOSIS — E1142 Type 2 diabetes mellitus with diabetic polyneuropathy: Secondary | ICD-10-CM | POA: Diagnosis not present

## 2023-09-04 DIAGNOSIS — M7072 Other bursitis of hip, left hip: Secondary | ICD-10-CM | POA: Insufficient documentation

## 2023-09-04 DIAGNOSIS — I10 Essential (primary) hypertension: Secondary | ICD-10-CM

## 2023-09-04 DIAGNOSIS — G629 Polyneuropathy, unspecified: Secondary | ICD-10-CM

## 2023-09-04 DIAGNOSIS — R748 Abnormal levels of other serum enzymes: Secondary | ICD-10-CM | POA: Insufficient documentation

## 2023-09-04 DIAGNOSIS — H353230 Exudative age-related macular degeneration, bilateral, stage unspecified: Secondary | ICD-10-CM | POA: Insufficient documentation

## 2023-09-04 MED ORDER — METFORMIN HCL 850 MG PO TABS: 850.0000 mg | ORAL_TABLET | Freq: Every day | ORAL | 1 refills | Status: DC

## 2023-09-04 MED ORDER — AMLODIPINE BESYLATE 5 MG PO TABS: 5.0000 mg | ORAL_TABLET | Freq: Every day | ORAL | 1 refills | Status: DC

## 2023-09-04 NOTE — Progress Notes (Signed)
    Procedures performed today:    None.  Independent interpretation of notes and tests performed by another provider:   None.  Brief History, Exam, Impression, and Recommendations:    BP 138/66 (BP Location: Right Arm, Patient Position: Sitting, Cuff Size: Large)   Pulse 66   Ht 5\' 8"  (1.727 m)   Wt 262 lb (118.8 kg)   SpO2 95%   BMI 39.84 kg/m   Diabetic polyneuropathy associated with type 2 diabetes mellitus (HCC) -     Immunofixation electrophoresis -     Protein electrophoresis, serum -     Vitamin B1 -     B12 and Folate Panel -     Copper, serum  Neuropathy Assessment & Plan: Patient continues on gabapentin .  Was also started on duloxetine  by neurologist.  This was about 1 month ago.  Has not noticed significant changes in regards to symptoms as of yet.  It does also appear that labs were ordered by neurologist, however patient has not had these done as of yet. We discussed considerations.  At this time, we can continue with current medication regimen.  Would recommend the patient to have labs completed.  He would like to do so today if possible.  We have ordered these labs and will also have these forwarded to neurologist.  Orders: -     Immunofixation electrophoresis -     Protein electrophoresis, serum -     Vitamin B1 -     B12 and Folate Panel -     Copper, serum  Other orders -     amLODIPine  Besylate; Take 1 tablet (5 mg total) by mouth daily. For blood pressure.  Dispense: 90 tablet; Refill: 1 -     metFORMIN  HCl; Take 1 tablet (850 mg total) by mouth daily with breakfast. Takes it once in the morning, patient choice  Dispense: 90 tablet; Refill: 1  Patient does continue to follow with his spine specialist, Dr. Sulema Endo.  He did have prior surgery but continues to have some specific low back pain, indicates that radicular symptoms have improved after surgery.  It does appear that home health PT referral was placed by Dr. Sulema Endo, however when PT had come to begin  physical therapy services, patient was not aware of what they were necessarily treating and thus he declined home health PT initially.  After discussion today, patient would be open to having home health PT arranged with referral placed again.  We have reached out today to Dr. Frieda Jew office in order to help with arranging this.  Return in about 3 months (around 12/04/2023).  Spent 32 minutes on this patient encounter, including preparation, chart review, face-to-face counseling with patient and coordination of care, and documentation of encounter   ___________________________________________ Moroni Nester de [place], MD, ABFM, [facility] and Sports Medicine [facility]

## 2023-09-04 NOTE — Patient Instructions (Signed)
   Medication Instructions:  Your physician recommends that you continue on your current medications as directed. Please refer to the Current Medication list given to you today. --If you need a refill on any your medications before your next appointment, please call your pharmacy first. If no refills are authorized on file call the office.-- Lab Work: Your physician has recommended that you have lab work today: today If you have labs (blood work) drawn today and your tests are completely normal, you will receive your results via MyChart message OR a phone call from our staff.  Please ensure you check your voicemail in the event that you authorized detailed messages to be left on a delegated number. If you have any lab test that is abnormal or we need to change your treatment, we will call you to review the results.    Follow-Up: Your next appointment:   Your physician recommends that you schedule a follow-up appointment in: 3-4 month follow up  with Dr. de [place]  You will receive a text message or e-mail with a link to a survey about your care and experience with [place] today! We would greatly appreciate your feedback!   Thanks for letting us be apart of your health journey!!  Primary Care and Sports Medicine   Dr. Ceasar Mons [place]   We encourage you to activate your patient portal called "MyChart".  Sign up information is provided on this After Visit Summary.  MyChart is used to connect with patients for Virtual Visits (Telemedicine).  Patients are able to view lab/test results, encounter notes, upcoming appointments, etc.  Non-urgent messages can be sent to your provider as well. To learn more about what you can do with MyChart, please visit --  [email or web address].

## 2023-09-04 NOTE — Assessment & Plan Note (Signed)
 Patient continues on gabapentin .  Was also started on duloxetine  by neurologist.  This was about 1 month ago.  Has not noticed significant changes in regards to symptoms as of yet.  It does also appear that labs were ordered by neurologist, however patient has not had these done as of yet. We discussed considerations.  At this time, we can continue with current medication regimen.  Would recommend the patient to have labs completed.  He would like to do so today if possible.  We have ordered these labs and will also have these forwarded to neurologist.

## 2023-09-04 NOTE — Assessment & Plan Note (Signed)
 Blood pressure borderline initially in office today, did improve on recheck.  Continues with lisinopril .  Also has amlodipine  listed on chart, however does not have medication currently.  He would be open to resuming this medication. Prescription for amlodipine  sent to pharmacy on file, can take in conjunction with lisinopril .  Recommend intermittent monitoring of blood pressure at home, DASH diet.  Will continue to keep a close eye on blood pressure here in the office as well.

## 2023-09-07 LAB — PROTEIN ELECTROPHORESIS, SERUM
A/G Ratio: 1.1 (ref 0.7–1.7)
Albumin ELP: 3.7 g/dL (ref 2.9–4.4)
Alpha 1: 0.3 g/dL (ref 0.0–0.4)
Alpha 2: 0.8 g/dL (ref 0.4–1.0)
Beta: 1.3 g/dL (ref 0.7–1.3)
Gamma Globulin: 1 g/dL (ref 0.4–1.8)
Globulin, Total: 3.4 g/dL (ref 2.2–3.9)

## 2023-09-07 LAB — IMMUNOFIXATION ELECTROPHORESIS
IgA/Immunoglobulin A, Serum: 485 mg/dL — ABNORMAL HIGH (ref 61–437)
IgG (Immunoglobin G), Serum: 1058 mg/dL (ref 603–1613)
IgM (Immunoglobulin M), Srm: 94 mg/dL (ref 15–143)
Total Protein: 7.1 g/dL (ref 6.0–8.5)

## 2023-09-07 LAB — B12 AND FOLATE PANEL
Folate: 5.3 ng/mL (ref >3.0)
Vitamin B-12: 411 pg/mL (ref 232–1245)

## 2023-09-07 LAB — COPPER, SERUM: Copper: 124 ug/dL (ref 69–132)

## 2023-09-07 LAB — VITAMIN B1: Thiamine: 99.9 nmol/L (ref 66.5–200.0)

## 2023-09-29 ENCOUNTER — Other Ambulatory Visit: Payer: Self-pay | Admitting: Neurology

## 2023-10-01 ENCOUNTER — Ambulatory Visit: Admitting: Orthopedic Surgery

## 2023-10-01 ENCOUNTER — Other Ambulatory Visit (INDEPENDENT_AMBULATORY_CARE_PROVIDER_SITE_OTHER): Payer: Self-pay

## 2023-10-01 DIAGNOSIS — Z9889 Other specified postprocedural states: Secondary | ICD-10-CM | POA: Diagnosis not present

## 2023-10-01 MED ORDER — METHOCARBAMOL 500 MG PO TABS: 500.0000 mg | ORAL_TABLET | Freq: Four times a day (QID) | ORAL | 0 refills | Status: DC

## 2023-10-01 NOTE — Progress Notes (Signed)
 Orthopedic Surgery Post-operative Office Visit   Procedure: L4/5 laminectomy Date of Surgery: 07/10/2023 (~3 months post-op)   Assessment: Patient is a 83 y.o. whose leg pain has resolved since surgery.  Having back pain that is worse with standing or bending. Improves with rest     Plan: -No restrictions at this time -Home health PT did not get set up after our last visit so placed a new order -Pain management: naproxen , tylenol   -Can use heat on the back and should do daily stretches -Return to office in 3 months, x-rays needed at next visit: AP/lateral/flex/ex lumbar   ___________________________________________________________________________     Subjective: Patient has noticed significant improvement in his leg pain. He is not having any radiating leg pain. He is still having back pain that he feels with activity and improves with rest. Has been using naproxen . Has not done any therapy.    Objective:   General: no acute distress, appropriate affect Neurologic: alert, answering questions appropriately, following commands Respiratory: unlabored breathing on room air Skin: incision is well healed   MSK (spine):   -Strength exam                                                   Left                  Right   EHL                              5/5                  5/5 TA                                 5/5                  5/5 GSC                             5/5                  5/5 Knee extension            5/5                  5/5 Hip flexion                    5/5                  5/5   -Sensory exam                           Sensation intact to light touch in L2-S1 nerve distributions of bilateral lower extremities   Imaging: XRs of the lumbar spine from 10/01/2023 were independently reviewed and interpreted, showing disc height loss at multiple levels but most significant at L3/4 and L4/5 where there is also anterior osteophyte formation.  Laminectomy defect seen at L4/5.   No evidence of instability on flexion/extension views.  No fracture or dislocation seen.     Patient name: Kenneth Henson Patient MRN: [id number] Date of visit: 10/01/23

## 2023-11-23 ENCOUNTER — Other Ambulatory Visit: Payer: Self-pay

## 2023-11-23 ENCOUNTER — Emergency Department (HOSPITAL_BASED_OUTPATIENT_CLINIC_OR_DEPARTMENT_OTHER)
Admission: EM | Admit: 2023-11-23 | Discharge: 2023-11-23 | Disposition: A | Discharge status: Home / Self Care | Attending: Emergency Medicine | Admitting: Emergency Medicine

## 2023-11-23 ENCOUNTER — Encounter (HOSPITAL_BASED_OUTPATIENT_CLINIC_OR_DEPARTMENT_OTHER): Payer: Self-pay | Admitting: Emergency Medicine

## 2023-11-23 ENCOUNTER — Emergency Department (HOSPITAL_BASED_OUTPATIENT_CLINIC_OR_DEPARTMENT_OTHER)

## 2023-11-23 DIAGNOSIS — S8000XA Contusion of unspecified knee, initial encounter: Secondary | ICD-10-CM | POA: Diagnosis not present

## 2023-11-23 DIAGNOSIS — Z79899 Other long term (current) drug therapy: Secondary | ICD-10-CM | POA: Insufficient documentation

## 2023-11-23 DIAGNOSIS — Y9301 Activity, walking, marching and hiking: Secondary | ICD-10-CM | POA: Diagnosis not present

## 2023-11-23 DIAGNOSIS — I10 Essential (primary) hypertension: Secondary | ICD-10-CM | POA: Diagnosis not present

## 2023-11-23 DIAGNOSIS — Z7982 Long term (current) use of aspirin: Secondary | ICD-10-CM | POA: Diagnosis not present

## 2023-11-23 DIAGNOSIS — W01198A Fall on same level from slipping, tripping and stumbling with subsequent striking against other object, initial encounter: Secondary | ICD-10-CM | POA: Insufficient documentation

## 2023-11-23 DIAGNOSIS — W19XXXA Unspecified fall, initial encounter: Secondary | ICD-10-CM

## 2023-11-23 DIAGNOSIS — S3991XA Unspecified injury of abdomen, initial encounter: Secondary | ICD-10-CM | POA: Diagnosis present

## 2023-11-23 DIAGNOSIS — S301XXA Contusion of abdominal wall, initial encounter: Secondary | ICD-10-CM | POA: Insufficient documentation

## 2023-11-23 DIAGNOSIS — R0781 Pleurodynia: Secondary | ICD-10-CM | POA: Insufficient documentation

## 2023-11-23 DIAGNOSIS — Z7984 Long term (current) use of oral hypoglycemic drugs: Secondary | ICD-10-CM | POA: Diagnosis not present

## 2023-11-23 DIAGNOSIS — E119 Type 2 diabetes mellitus without complications: Secondary | ICD-10-CM | POA: Diagnosis not present

## 2023-11-23 LAB — COMPREHENSIVE METABOLIC PANEL WITH GFR
ALT: 11 U/L (ref 0–44)
AST: 15 U/L (ref 15–41)
Albumin: 4.1 g/dL (ref 3.5–5.0)
Alkaline Phosphatase: 272 U/L — ABNORMAL HIGH (ref 38–126)
Anion gap: 11 (ref 5–15)
BUN: 9 mg/dL (ref 8–23)
CO2: 23 mmol/L (ref 22–32)
Calcium: 9.5 mg/dL (ref 8.9–10.3)
Chloride: 100 mmol/L (ref 98–111)
Creatinine, Ser: 0.94 mg/dL (ref 0.61–1.24)
GFR, Estimated: >60 mL/min (ref >60)
Glucose, Bld: 111 mg/dL — ABNORMAL HIGH (ref 70–99)
Potassium: 4.5 mmol/L (ref 3.5–5.1)
Sodium: 135 mmol/L (ref 135–145)
Total Bilirubin: 0.4 mg/dL (ref 0.0–1.2)
Total Protein: 7.3 g/dL (ref 6.5–8.1)

## 2023-11-23 LAB — CBC WITH DIFFERENTIAL/PLATELET
Abs Immature Granulocytes: 0.03 K/uL (ref 0.00–0.07)
Basophils Absolute: 0 K/uL (ref 0.0–0.1)
Basophils Relative: 0 %
Eosinophils Absolute: 0.5 K/uL (ref 0.0–0.5)
Eosinophils Relative: 5 %
HCT: 42.1 % (ref 39.0–52.0)
Hemoglobin: 14.3 g/dL (ref 13.0–17.0)
Immature Granulocytes: 0 %
Lymphocytes Relative: 16 %
Lymphs Abs: 1.5 K/uL (ref 0.7–4.0)
MCH: 30 pg (ref 26.0–34.0)
MCHC: 34 g/dL (ref 30.0–36.0)
MCV: 88.3 fL (ref 80.0–100.0)
Monocytes Absolute: 0.8 K/uL (ref 0.1–1.0)
Monocytes Relative: 9 %
Neutro Abs: 6.6 K/uL (ref 1.7–7.7)
Neutrophils Relative %: 70 %
Platelets: 255 K/uL (ref 150–400)
RBC: 4.77 MIL/uL (ref 4.22–5.81)
RDW: 13.3 % (ref 11.5–15.5)
WBC: 9.5 K/uL (ref 4.0–10.5)
nRBC: 0 % (ref 0.0–0.2)

## 2023-11-23 MED ORDER — TRAMADOL HCL 50 MG PO TABS: 50.0000 mg | ORAL_TABLET | Freq: Four times a day (QID) | ORAL | 0 refills | Status: DC | PRN

## 2023-11-23 MED ORDER — IOHEXOL 300 MG/ML  SOLN
100.0000 mL | Freq: Once | INTRAMUSCULAR | Status: AC | PRN
Start: 2023-11-23 — End: 2023-11-23
  Administered 2023-11-23: 100 mL via INTRAVENOUS

## 2023-11-23 NOTE — ED Provider Notes (Signed)
 [place] EMERGENCY DEPARTMENT AT [facility] Provider Note   CSN: [id number] Arrival date & time: 11/23/23  1230     Patient presents with: [place]   Kenneth Henson is a 83 y.o. male.   Pt is a 83 yo male with pmhx significant for htn, hld, dm2, cva, and ptsd.  Pt tripped and fell on Monday (July 7).  He landed on his knees and also hit his right flank.  He's had a lot of pain since then.  He has been able to ambulate.       Prior to Admission medications   Medication Sig Start Date End Date Taking? Authorizing Provider  traMADol  (ULTRAM ) 50 MG tablet Take 1 tablet (50 mg total) by mouth every 6 (six) hours as needed. 11/23/23  Yes Dean Clarity, MD  allopurinol (ZYLOPRIM) 300 MG tablet Take 300 mg by mouth every 12 (twelve) hours as needed (gout).    [provider]  amLODipine  (NORVASC ) 5 MG tablet Take 1 tablet (5 mg total) by mouth daily. For blood pressure. 09/04/23   de [place], [name], MD  aspirin  EC 81 MG tablet Take 81 mg by mouth daily. Swallow whole.    [provider]  chlorhexidine  (HIBICLENS ) 4 % external liquid Apply 15 mLs (1 Application total) topically as directed for 30 doses. Use as directed daily for 5 days every other week for 6 weeks. 07/10/23   Georgina Ozell LABOR, MD  diphenhydrAMINE  (BENADRYL ) 25 mg capsule Take 25 mg by mouth 2 (two) times daily as needed for itching (eczema).    [provider]  diphenhydrAMINE  HCl, Sleep, (ZZZQUIL) 50 MG/30ML LIQD Take 50 mg by mouth daily as needed (sleep).    [provider]  DULoxetine  (CYMBALTA ) 30 MG capsule Take 1 capsule (30 mg total) by mouth at bedtime as needed. 07/30/23   Patel, Donika K, DO  gabapentin  (NEURONTIN ) 300 MG capsule Take 1 tab before bed x 2 weeks.  May increase to 1 tab at dinner for 2 weeks and then may increase to am, dinner and before bed thereafter for pain. Patient taking differently: Take 300 mg by mouth QID. 04/02/23   de [place], Quintin PARAS, MD   lisinopril  (PRINIVIL ,ZESTRIL ) 40 MG tablet Take 1 tablet (40 mg total) by mouth daily. 05/29/11   Tabori, Katherine E, MD  MELATONIN PO Take 1 tablet by mouth at bedtime as needed (sleep).    [provider]  metFORMIN  (GLUCOPHAGE ) 850 MG tablet Take 1 tablet (850 mg total) by mouth daily with breakfast. Takes it once in the morning, patient choice 09/04/23   de [place], Quintin PARAS, MD  methocarbamol  (ROBAXIN ) 500 MG tablet Take 1 tablet (500 mg total) by mouth 4 (four) times daily. 10/01/23   Georgina Ozell LABOR, MD  Multiple Vitamin (MULTIVITAMIN WITH MINERALS) TABS tablet Take 1 tablet by mouth daily. Centrum Silver for Men 50+    [provider]  naproxen  (NAPROSYN ) 500 MG tablet Take 1 tablet (500 mg total) by mouth 2 (two) times daily. 07/12/23   Kommor, Madison, MD  traZODone  (DESYREL ) 50 MG tablet Take 50 mg by mouth at bedtime.    [provider]    Allergies: Doxycycline and Oxycodone -acetaminophen     Review of Systems  Gastrointestinal:        Right sided flank pain  Musculoskeletal:        Bilateral knee pain  All other systems reviewed and are negative.   Updated Vital Signs BP (!) 154/49  Pulse 68   Temp 98 F (36.7 C) (Oral)   Resp 18   SpO2 95%   Physical Exam Vitals and nursing note reviewed.  Constitutional:      Appearance: Normal appearance. He is obese.  HENT:     Head: Normocephalic and atraumatic.     Right Ear: External ear normal.     Left Ear: External ear normal.     Nose: Nose normal.     Mouth/Throat:     Mouth: Mucous membranes are moist.     Pharynx: Oropharynx is clear.  Eyes:     Extraocular Movements: Extraocular movements intact.     Conjunctiva/sclera: Conjunctivae normal.     Pupils: Pupils are equal, round, and reactive to light.  Cardiovascular:     Rate and Rhythm: Normal rate and regular rhythm.     Pulses: Normal pulses.     Heart sounds: Normal heart sounds.  Pulmonary:     Effort: Pulmonary effort is  normal.     Breath sounds: Normal breath sounds.  Abdominal:     General: Abdomen is flat. Bowel sounds are normal.     Palpations: Abdomen is soft.   Musculoskeletal:     Cervical back: Normal range of motion and neck supple.       Legs:  Skin:    General: Skin is warm.     Capillary Refill: Capillary refill takes less than 2 seconds.  Neurological:     General: No focal deficit present.     Mental Status: He is alert and oriented to person, place, and time.  Psychiatric:        Mood and Affect: Mood normal.        Behavior: Behavior normal.     (all labs ordered are listed, but only abnormal results are displayed) Labs Reviewed  COMPREHENSIVE METABOLIC PANEL WITH GFR - Abnormal; Notable for the following components:      Result Value   Glucose, Bld 111 (*)    Alkaline Phosphatase 272 (*)    All other components within normal limits  CBC WITH DIFFERENTIAL/PLATELET    EKG: None  Radiology: DG Knee Complete 4 Views Right Result Date: 11/23/2023 CLINICAL DATA:  Fall, pain. EXAM: RIGHT KNEE - COMPLETE 4+ VIEW COMPARISON:  Right knee x-ray 08/20/2023 FINDINGS: There is a small suprapatellar joint effusion. There is no acute fracture or dislocation. There is severe medial compartment joint space narrowing. There is tricompartmental osteophyte formation. IMPRESSION: 1. No acute fracture or dislocation. 2. Small suprapatellar joint effusion. 3. Degenerative changes, severe in the medial compartment. Electronically Signed   By: Greig Pique M.D.   On: 11/23/2023 15:33   DG Knee Complete 4 Views Left Result Date: 11/23/2023 CLINICAL DATA:  Bilateral knee pain after fall several days ago. EXAM: LEFT KNEE - COMPLETE 4+ VIEW COMPARISON:  None Available. FINDINGS: No fracture or dislocation is noted. Probable small suprapatellar joint effusion is noted. Severe narrowing of lateral joint space is noted. Moderate narrowing of medial joint space is noted. Probable mild narrowing of  patellofemoral space is noted. IMPRESSION: Tricompartmental degenerative joint disease as noted above. Probable small suprapatellar joint effusion. No fracture or dislocation. Electronically Signed   By: Lynwood Landy Raddle M.D.   On: 11/23/2023 15:28   CT CHEST ABDOMEN PELVIS W CONTRAST Result Date: 11/23/2023 CLINICAL DATA:  Lung poly trauma, right rib pain after fall on Monday EXAM: CT CHEST, ABDOMEN, AND PELVIS WITH CONTRAST TECHNIQUE: Multidetector CT imaging of the chest, abdomen  and pelvis was performed following the standard protocol during bolus administration of intravenous contrast. RADIATION DOSE REDUCTION: This exam was performed according to the departmental dose-optimization program which includes automated exposure control, adjustment of the mA and/or kV according to patient size and/or use of iterative reconstruction technique. CONTRAST:  OMNIPAQUE  IOHEXOL  300 MG/ML  SOLN COMPARISON:  February 12/02/2023 CT and prior studies FINDINGS: CT CHEST FINDINGS Cardiovascular: Normal caliber aorta. Scattered aortic calcifications. No pericardial effusion. Mediastinum/Nodes: No lymphadenopathy. Lungs/Pleura: Lungs are clear. No pleural effusions. No pneumothorax. Musculoskeletal: Partially imaged bilateral reverse shoulder arthroplasty hardware. No acute fractures are identified. Multilevel degenerative vertebral changes. CT ABDOMEN PELVIS FINDINGS Hepatobiliary: Layering stones/sludge in the gallbladder. Pancreas: Unremarkable. Spleen: Unremarkable. Adrenals/Urinary Tract: Punctate nonobstructing stones in the left kidney. No hydronephrosis. No suspicious renal masses. Adrenal glands are unremarkable. Stomach/Bowel: No evidence of bowel obstruction or inflammation. The visualized appendix is unremarkable. Colonic diverticulosis. Vascular/Lymphatic: Normal caliber abdominal aorta. Reproductive: Unremarkable. Other: No free air or free fluid. 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 There is also abnormal sclerosis of the L1 vertebral body, likely related to the same process. No acute fractures are identified. IMPRESSION: 1.  No acute injury in the chest, abdomen, or pelvis. 2. 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 Electronically Signed   By: Michaeline Blanch M.D.   On: 11/23/2023 14:30     Procedures   Medications Ordered in the ED  iohexol  (OMNIPAQUE ) 300 MG/ML solution 100 mL (100 mLs Intravenous Contrast Given 11/23/23 1343)                                    Medical Decision Making Amount and/or Complexity of Data Reviewed Labs: ordered. Radiology: ordered.  Risk Prescription drug management.   This patient presents to the ED for concern of fall, this involves an extensive number of treatment options, and is a complaint that carries with it a high risk of complications and morbidity.  The differential diagnosis includes multiple trauma   Co morbidities that complicate the patient evaluation  htn, hld, dm2, cva, and ptsd   Additional history obtained:  Additional history obtained from epic chart review External records from outside source obtained and reviewed including family   Lab Tests:  I Ordered, and personally interpreted labs.  The pertinent results include:  cbc nl, cmp nl   Imaging Studies ordered:  I ordered imaging studies including bilat knee and ct chest/abd/pelvis  I independently visualized and interpreted imaging which showed  CT chest/abd/pelvis: No acute injury in the chest, abdomen, or pelvis.    2. Chronic sclerosis of the sacrum and L1, possibly related to  Paget's disease.  R knee: No acute fracture or dislocation.  2. Small suprapatellar joint effusion.  3. Degenerative changes, severe in the medial compartment.  L knee: Tricompartmental degenerative joint disease as noted above. Probable  small suprapatellar joint effusion. No  fracture or dislocation.   I agree with the radiologist interpretation  Medicines ordered and prescription drug management:   I have reviewed the patients home medicines and have made adjustments as needed   Test Considered:  ct   Problem List / ED Course:  Flank pain:  contusion.  No internal injury.  Pt does not do well with oxycodone  or hydrocodone .  He does ok with tramadol  which he will only take if he has to  for pain.  He did not want anything for pain while here.  B knee pain:  contusion.  No internal injury   Reevaluation:  After the interventions noted above, I reevaluated the patient and found that they have :improved   Social Determinants of Health:  Lives at home   Dispostion:  After consideration of the diagnostic results and the patients response to treatment, I feel that the patent would benefit from discharge with outpatient f/u.       Final diagnoses:  Contusion of right flank  Fall, initial encounter  Contusion of knee, unspecified laterality, initial encounter    ED Discharge Orders          Ordered    traMADol  (ULTRAM ) 50 MG tablet  Every 6 hours PRN        11/23/23 1538               Bethani Brugger, MD 11/23/23 1546

## 2023-11-23 NOTE — ED Notes (Signed)
 Fall risk bundle is currently in place.

## 2023-11-23 NOTE — ED Triage Notes (Signed)
 Fall on Monday Tripped over my feet Fell onto right side when standing and walking Pain in right rib Denies loc, did not hit head Takes 81MG  asa daly

## 2023-12-04 ENCOUNTER — Ambulatory Visit: Admitting: Neurology

## 2023-12-05 ENCOUNTER — Ambulatory Visit (HOSPITAL_BASED_OUTPATIENT_CLINIC_OR_DEPARTMENT_OTHER): Admitting: Family Medicine

## 2023-12-11 ENCOUNTER — Encounter: Payer: Self-pay | Admitting: Neurology

## 2023-12-11 ENCOUNTER — Ambulatory Visit (INDEPENDENT_AMBULATORY_CARE_PROVIDER_SITE_OTHER): Admitting: Neurology

## 2023-12-11 ENCOUNTER — Ambulatory Visit (HOSPITAL_BASED_OUTPATIENT_CLINIC_OR_DEPARTMENT_OTHER): Admitting: Family Medicine

## 2023-12-11 VITALS — BP 132/70 | HR 79 | Wt 254.0 lb

## 2023-12-11 DIAGNOSIS — G621 Alcoholic polyneuropathy: Secondary | ICD-10-CM | POA: Diagnosis not present

## 2023-12-11 DIAGNOSIS — E1142 Type 2 diabetes mellitus with diabetic polyneuropathy: Secondary | ICD-10-CM | POA: Diagnosis not present

## 2023-12-11 MED ORDER — DULOXETINE HCL 30 MG PO CPEP: 30.0000 mg | ORAL_CAPSULE | Freq: Two times a day (BID) | ORAL | 1 refills | Status: DC

## 2023-12-11 NOTE — Progress Notes (Signed)
 Follow-up Visit   Date: 12/11/2023    Kenneth Henson MRN: [id number] DOB: November 21, 1940   Kenneth Henson is a 83 y.o. 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  The patient was accompanied to the clinic by daughter who also provides collateral information.    IMPRESSION/PLAN: Painful neuropathy affecting the feet manifesting of the distal paresthesias and sensory ataxia. He continues to have poorly controlled pain in the feet.  Risk factors:  diabetes mellitus, alcohol  - Increase duloxetine  to 30mg  twice daily  - Continue gabapentin  1200mg /d (300-300-600)  - Fall precautions discussed  - Advised to cut back alcohol - Always use cane/walker  Return to clinic in 6 months  --------------------------------------------- History of present illness: Starting around 2022, he began having burning, stabbing, and stinging pain involving the feet.  It is worse in the evening.  He endorses imbalance and uses a cane and walker for the past few years.  He has not had any falls.  He takes gabapentin  300mg  in the morning, 300mg  in the afternoon, and 600mg  at bedtime; however, he still continues to have pain in the evening.  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  He will be start PT next month.     Nonsmoker.  He was drinking a pint of liquor per week x 25 years, which he reduced to about 2 -4 beverages per week.   UPDATE 12/11/2023:  He is here for follow-up.  He denies any significant improvement of feet pain after starting duloxetine .  Pain is relatively unchanged.  He also takes gabapentin  1200mg /d.  He also has low back pain and sees Dr. Georgina for this.  He was supposed to have home PT, however reports they did not contact him.  He continues to drink moderate alcohol.    Medications:  Current Outpatient Medications on File Prior to Visit  Medication Sig Dispense Refill   allopurinol (ZYLOPRIM) 300 MG tablet Take 300 mg by mouth every 12 (twelve) hours as needed (gout).     aspirin  EC 81 MG tablet Take 81 mg by mouth daily. Swallow whole.     diphenhydrAMINE  (BENADRYL ) 25 mg capsule Take 25 mg by mouth 2 (two) times daily as needed for itching (eczema).     diphenhydrAMINE  HCl, Sleep, (ZZZQUIL) 50 MG/30ML LIQD Take 50 mg by mouth daily as needed (sleep).     DULoxetine  (CYMBALTA ) 30 MG capsule Take 1 capsule (30 mg total) by mouth at bedtime as needed. 30 capsule 5   gabapentin  (NEURONTIN ) 300 MG capsule Take 1 tab before bed x 2 weeks.  May increase to 1 tab at dinner for 2 weeks and then may increase to am, dinner and before bed thereafter for pain. 90 capsule 3   lisinopril  (PRINIVIL ,ZESTRIL ) 40 MG tablet Take 1 tablet (40 mg total) by mouth daily. 90 tablet 3   MELATONIN PO Take 1 tablet by mouth at bedtime as needed (sleep).     metFORMIN  (GLUCOPHAGE ) 850 MG tablet Take 1 tablet (850 mg total) by mouth daily with breakfast. Takes it once in the morning, patient choice 90 tablet 1   Multiple Vitamin (MULTIVITAMIN WITH MINERALS) TABS tablet Take 1 tablet by mouth daily. Centrum Silver for Men 50+     traMADol  (ULTRAM ) 50 MG tablet Take 1 tablet (50 mg total) by mouth every 6 (  six) hours as needed. 15 tablet 0   traZODone  (DESYREL ) 50 MG tablet Take 50 mg by mouth at bedtime.     amLODipine  (NORVASC ) 5 MG tablet Take 1 tablet (5 mg total) by mouth daily. For blood pressure. (Patient not taking: Reported on 12/11/2023) 90 tablet 1   chlorhexidine  (HIBICLENS ) 4 % external liquid Apply 15 mLs (1 Application total) topically as directed for 30 doses. Use as directed daily for 5 days every other week for 6 weeks. (Patient not taking: Reported on 12/11/2023) 946 mL 1   methocarbamol  (ROBAXIN ) 500 MG tablet Take 1 tablet (500 mg total) by mouth 4 (four) times daily.  (Patient not taking: Reported on 12/11/2023) 40 tablet 0   naproxen  (NAPROSYN ) 500 MG tablet Take 1 tablet (500 mg total) by mouth 2 (two) times daily. (Patient not taking: Reported on 12/11/2023) 30 tablet 0   No current facility-administered medications on file prior to visit.    Allergies:  Allergies  Allergen Reactions   Doxycycline Nausea And Vomiting   Oxycodone -Acetaminophen  Itching    Vital Signs:  BP (!) 156/53   Pulse 79   Wt 254 lb (115.2 kg)   SpO2 92%   BMI 38.62 kg/m     Neurological Exam: MENTAL STATUS including orientation to time, place, person, recent and remote memory, attention span and concentration, language, and fund of knowledge is normal.  Speech is not dysarthric.  CRANIAL NERVES:   Pupils equal round and reactive to light.  Normal conjugate, extra-ocular eye movements in all directions of gaze.  No ptosis.  Face is symmetric.   MOTOR:  Motor strength is 5/5 in all extremities.  No atrophy, fasciculations or abnormal movements.  No pronator drift.  Tone is normal.    MSRs:  Reflexes are 2+/4 throughout, except absent at the ankles.  SENSORY:  Vibration diminished at the ankles bilaterally.  COORDINATION/GAIT:  Gait is wide-based, assisted with cane, stable.   Data:  Lab Results  Component Value Date   HGBA1C 7.3 (A) 06/14/2023   HGBA1C 7.3 06/14/2023   Lab Results  Component Value Date   VITAMINB12 411 09/04/2023   Lab Results  Component Value Date   TSH 0.88 02/24/2011     Thank you for allowing me to participate in patient's care.  If I can answer any additional questions, I would be pleased to do so.    Sincerely,    Zeven Kocak K. Tobie, DO

## 2023-12-31 ENCOUNTER — Ambulatory Visit: Admitting: Orthopedic Surgery

## 2023-12-31 ENCOUNTER — Ambulatory Visit: Payer: Self-pay

## 2023-12-31 NOTE — Telephone Encounter (Signed)
 FYI Only or Action Required?: FYI only for provider.  Patient was last seen in primary care on 09/04/2023 by de Peru, Quintin PARAS, MD.  Called Nurse Triage reporting Covid Positive.  Symptoms began several days ago.  Interventions attempted: Rest, hydration, or home remedies.  Symptoms are: gradually worsening.  Triage Disposition: See HCP Within 4 Hours (Or PCP Triage)  Patient/caregiver understands and will follow disposition?: Yes                    Copied from CRM #[id number]. Topic: Clinical - Red Word Triage >> Dec 31, 2023  8:19 AM Vena H wrote: Red Word that prompted transfer to Nurse Triage: Pt's daughter called in stating pt tested positive for covid yesterday and is now feeling a lot worse , body aches, cough , congestion. Pt sates pain is 6/10 Reason for Disposition  Wheezing is present  Answer Assessment - Initial Assessment Questions 1. ONSET: When did the cough begin?      X 3 days, COVID positive yesterday  5. DIFFICULTY BREATHING: Are you having difficulty breathing? If Yes, ask: How bad is it? (e.g., mild, moderate, severe)      None 6. FEVER: Do you have a fever? If Yes, ask: What is your temperature, how was it measured, and when did it start?     Was warm to touch but unable to take temperature 7. CARDIAC HISTORY: Do you have any history of heart disease? (e.g., heart attack, congestive heart failure)      NA 8. LUNG HISTORY: Do you have any history of lung disease?  (e.g., pulmonary embolus, asthma, emphysema)     NA 9. PE RISK FACTORS: Do you have a history of blood clots? (or: recent major surgery, recent prolonged travel, bedridden)     NA 10. OTHER SYMPTOMS: Do you have any other symptoms? (e.g., runny nose, wheezing, chest pain)       Body aches, wheezing  Protocols used: Cough - Acute Productive-A-AH

## 2024-01-01 ENCOUNTER — Ambulatory Visit (INDEPENDENT_AMBULATORY_CARE_PROVIDER_SITE_OTHER): Admitting: Family Medicine

## 2024-01-01 ENCOUNTER — Encounter (HOSPITAL_BASED_OUTPATIENT_CLINIC_OR_DEPARTMENT_OTHER): Payer: Self-pay | Admitting: Family Medicine

## 2024-01-01 ENCOUNTER — Ambulatory Visit (HOSPITAL_BASED_OUTPATIENT_CLINIC_OR_DEPARTMENT_OTHER): Payer: Self-pay | Admitting: Family Medicine

## 2024-01-01 ENCOUNTER — Ambulatory Visit (INDEPENDENT_AMBULATORY_CARE_PROVIDER_SITE_OTHER)

## 2024-01-01 VITALS — BP 145/72 | HR 81 | Wt 247.0 lb

## 2024-01-01 DIAGNOSIS — U071 COVID-19: Secondary | ICD-10-CM

## 2024-01-01 MED ORDER — ALBUTEROL SULFATE HFA 108 (90 BASE) MCG/ACT IN AERS: 2.0000 | INHALATION_SPRAY | Freq: Four times a day (QID) | RESPIRATORY_TRACT | 2 refills | Status: DC | PRN

## 2024-01-01 NOTE — Patient Instructions (Signed)
 Stay home and away from others until symptoms are improving and fever free for 24 hours without the use of tylenol  or ibuprofen .If symptoms worsen, or the patient develop shortness of breath, pulse oximeter reading of < 90%, or chest pain, seek immediate care at nearest emergency department or call 911.   Recommended OTC vitamins, medications for control of symptoms.   Vitamin Regimen:  Vitamin C 500mg  twice daily  Vitamin D 5000 units once daily  Zinc 50-75mg  once daily   Over the counter Medications:  Aspirin  81mg  per day * unless allergic or contraindicated*  Use Tylenol  (acetaminophen ) for fever *unless allergic or contraindicated*  PLAIN Mucinex (not DM) for congestion    Non-Medication Therapy:  Drink plenty of fluids, warm if possible.   A teaspoon of honey may help ease coughing symptoms.   Cough drops or hard candy for coughing.   Over the Counter Medication Therapy:  Use a cough expectorant such as guaifenesin (Mucinex) if recommended by your doctor for a wet, congested cough. If you have high blood pressure, please ask your doctor first before using this.   Use a cough suppressant such as dextromethorphan (Robitussin/Delsym) for a dry cough. If you have high blood pressure, please ask your doctor first before using this.   If you have high blood pressure, medication such as Coricidin HBP is safe to take for your cough and will not increase your blood pressure.

## 2024-01-01 NOTE — Progress Notes (Signed)
 Acute Care Office Visit  Subjective:   Kenneth Henson 1940/12/04 01/01/2024  Chief Complaint  Patient presents with   Acute Visit    Patient states he here to follow up on his positive testing for Covid.    HPI: Patient's accompanied by daughter Nat today. On Saturday, he began having chills, fatigue and was feeling warm. He did test positive for COVID on Sunday. He has no appetite, has mild SHOB, congested cough. He does reports hx of pneumonia but has not been hospitalized for this before.  He denies nausea, vomiting, diarrhea.  He has been using ibuprofen  and NyQuil as needed.  Reports mild dizziness upon position changes.  He has been able to drink fluids and denies any vomiting with eating or drinking.  The following portions of the patient's history were reviewed and updated as appropriate: past medical history, past surgical history, family history, social history, allergies, medications, and problem list.   Patient Active Problem List   Diagnosis Date Noted   Bursitis of left hip 09/04/2023   Diabetic neuropathy (HCC) 09/04/2023   Exudative age-related macular degeneration, bilateral, stage unspecified (HCC) 09/04/2023   High serum alkaline phosphatase level 09/04/2023   Lumbar stenosis with neurogenic claudication 07/10/2023   Contact with and (suspected) exposure to other hazardous substances 06/14/2023   Preoperative clearance 06/14/2023   Neuropathy 06/14/2023   Major depressive disorder, recurrent, unspecified (HCC) 05/24/2023   Other specified problems related to psychosocial circumstances 05/24/2023   Spinal stenosis of lumbar region 02/27/2023   Insomnia 02/08/2023   Lipoma of neck 02/08/2023   Skin lesion 02/08/2023   Chronic bilateral low back pain with right-sided sciatica 12/07/2022   Prosthetic joint infection, subsequent encounter 10/19/2022   Medication management 10/04/2022   Status post reverse total arthroplasty of right shoulder 09/06/2022    Acute pain of right shoulder 08/11/2022   Synovial cyst of left popliteal space 08/11/2022   Fracture of left tibial plateau 08/06/2022   Need for influenza vaccination 03/01/2021   Pustular inflammation of skin 12/01/2020   Decreased visual acuity 12/01/2020   Moderately increased albuminuria 12/01/2020   Diabetes mellitus without complication (HCC) 10/27/2020   Vitreomacular adhesion of both eyes 10/27/2020   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 10/27/2020   Exudative age-related macular degeneration of left eye with active choroidal neovascularization (HCC) 10/27/2020   Diabetes (HCC) 09/01/2020   Retinal tear 09/01/2020   Headache 01/16/2012   Atypical nevus 11/07/2011   Trigger finger 05/22/2011   Bronchitis 05/22/2011   Preop cardiovascular exam 04/11/2011   Knee pain 02/24/2011   Bursitis of elbow 10/31/2010   OBESITY 04/28/2010   INCONTINENCE, URGE 04/28/2010   LEG PAIN, BILATERAL 12/08/2009   CHEST PAIN 12/08/2009   ELECTROCARDIOGRAM, ABNORMAL 12/08/2009   NEOPLASM, SKIN, UNCERTAIN BEHAVIOR 12/06/2009   PREMATURE ATRIAL CONTRACTIONS 12/06/2009   History of colonic polyps 11/17/2009   ERECTILE DYSFUNCTION, 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   Hyperlipidemia 09/29/2009   TOBACCO USE 09/29/2009   Essential hypertension 09/29/2009   SHOULDER, PAIN 09/29/2009   HIP PAIN, LEFT 09/29/2009   Abnormal involuntary movement 09/29/2009   Past Medical History:  Diagnosis Date   Abnormal involuntary movements(781.0)    Anginal pain (HCC)    30 years ago   Arthritis    Chicken pox    Claudication (HCC)    Colon polyps    Diabetes mellitus type II    Erectile dysfunction    Hyperlipidemia    Hypertension  Neoplasm of skin    uncertain behavior   PTSD (post-traumatic stress disorder)    Sleep apnea    Stroke [facility])    Past Surgical History:  Procedure Laterality Date   CARPAL TUNNEL RELEASE     right   DECOMPRESSIVE  LUMBAR LAMINECTOMY LEVEL 1 N/A 07/10/2023   Procedure: Lumbar  four-five DECOMPRESSIVE LUMBAR LAMINECTOMY LEVEL;  Surgeon: Georgina Ozell LABOR, MD;  Location: [facility] OR;  Service: Orthopedics;  Laterality: N/A;   HAND SURGERY     KNEE SURGERY     LIPOMA EXCISION Right 04/25/2023   Procedure: EXCISION LIPOMA POSTERIOR RIGHT NECK;  Surgeon: Ann Fine, MD;  Location: [facility] OR;  Service: General;  Laterality: Right;  LMA   MINOR HARDWARE REMOVAL Right 09/06/2022   Procedure: MINOR HARDWARE REMOVAL;  Surgeon: Cristy Bonner DASEN, MD;  Location: [facility] ORS;  Service: Orthopedics;  Laterality: Right;   NECK SURGERY     ORIF HUMERUS FRACTURE Right 09/06/2022   Procedure: OPEN REDUCTION INTERNAL FIXATION (ORIF) PROXIMAL HUMERUS FRACTURE;  Surgeon: Cristy Bonner DASEN, MD;  Location: [facility] ORS;  Service: Orthopedics;  Laterality: Right;   SHOULDER SURGERY Right    2020   SHOULDER SURGERY Left    2011   TOTAL SHOULDER REVISION Right 09/06/2022   Procedure: TOTAL SHOULDER REVISION;  Surgeon: Cristy Bonner DASEN, MD;  Location: [facility] ORS;  Service: Orthopedics;  Laterality: Right;   Family History  Problem Relation Age of Onset   Hypertension Mother    Heart disease Mother    Stroke Father    Breast cancer Sister    Colon polyps Sister    Colon cancer Neg Hx    Stomach cancer Neg Hx    Outpatient Medications Prior to Visit  Medication Sig Dispense Refill   allopurinol (ZYLOPRIM) 300 MG tablet Take 300 mg by mouth every 12 (twelve) hours as needed (gout).     aspirin  EC 81 MG tablet Take 81 mg by mouth daily. Swallow whole.     diphenhydrAMINE  (BENADRYL ) 25 mg capsule Take 25 mg by mouth 2 (two) times daily as needed for itching (eczema).     diphenhydrAMINE  HCl, Sleep, (ZZZQUIL) 50 MG/30ML LIQD Take 50 mg by mouth daily as needed (sleep).     DULoxetine  (CYMBALTA ) 30 MG capsule Take 1 capsule (30 mg total) by mouth 2 (two) times daily. 180 capsule 1   gabapentin  (NEURONTIN ) 300 MG capsule Take 1 tab before bed x 2 weeks.  May  increase to 1 tab at dinner for 2 weeks and then may increase to am, dinner and before bed thereafter for pain. 90 capsule 3   lisinopril  (PRINIVIL ,ZESTRIL ) 40 MG tablet Take 1 tablet (40 mg total) by mouth daily. 90 tablet 3   MELATONIN PO Take 1 tablet by mouth at bedtime as needed (sleep).     metFORMIN  (GLUCOPHAGE ) 850 MG tablet Take 1 tablet (850 mg total) by mouth daily with breakfast. Takes it once in the morning, patient choice 90 tablet 1   Multiple Vitamin (MULTIVITAMIN WITH MINERALS) TABS tablet Take 1 tablet by mouth daily. Centrum Silver for Men 50+     traMADol  (ULTRAM ) 50 MG tablet Take 1 tablet (50 mg total) by mouth every 6 (six) hours as needed. 15 tablet 0   traZODone  (DESYREL ) 50 MG tablet Take 50 mg by mouth at bedtime.     amLODipine  (NORVASC ) 5 MG tablet Take 1 tablet (5 mg total) by mouth daily. For blood pressure. (Patient not taking: Reported on 01/01/2024)  90 tablet 1   chlorhexidine  (HIBICLENS ) 4 % external liquid Apply 15 mLs (1 Application total) topically as directed for 30 doses. Use as directed daily for 5 days every other week for 6 weeks. (Patient not taking: Reported on 01/01/2024) 946 mL 1   methocarbamol  (ROBAXIN ) 500 MG tablet Take 1 tablet (500 mg total) by mouth 4 (four) times daily. (Patient not taking: Reported on 01/01/2024) 40 tablet 0   naproxen  (NAPROSYN ) 500 MG tablet Take 1 tablet (500 mg total) by mouth 2 (two) times daily. (Patient not taking: Reported on 01/01/2024) 30 tablet 0   No facility-administered medications prior to visit.   Allergies  Allergen Reactions   Doxycycline Nausea And Vomiting   Oxycodone -Acetaminophen  Itching     ROS: A complete ROS was performed with pertinent positives/negatives noted in the HPI. The remainder of the ROS are negative.    Objective:   Today's Vitals   01/01/24 1106  BP: (!) 145/72  Pulse: 81  SpO2: 95%  Weight: 247 lb (112 kg)    GENERAL: Well-appearing, in NAD. Well nourished.  SKIN: Pink, warm  and dry.  Skin turgor is intact without delay. Head: Normocephalic. NECK: Trachea midline. Full ROM w/o pain or tenderness. No lymphadenopathy.  EARS: Tympanic membranes are intact, translucent without bulging and without drainage. Appropriate landmarks visualized.  EYES: Conjunctiva clear without exudates. EOMI, PERRL, no drainage present.  NOSE: Septum midline w/o deformity. Nares patent, mucosa pink and non-inflamed w/o drainage. No sinus tenderness.  THROAT: Uvula midline. Oropharynx clear.  Mucous membranes pink and moist.  RESPIRATORY: Chest wall symmetrical. Respirations even and non-labored. Breath sounds mildly diminished to right lower base.  Left lung fields are clear to auscultation .  Cough is slightly congested, nonproductive. CARDIAC: S1, S2 present, regular rate and rhythm without murmur or gallops. Peripheral pulses 2+ bilaterally.  MSK: Muscle tone and strength appropriate for age.  NEUROLOGIC: No motor or sensory deficits. Steady, even gait. C2-C12 intact.  PSYCH/MENTAL STATUS: Alert, oriented x 3. Cooperative, appropriate mood and affect.    No results found for any visits on 01/01/24.    Assessment & Plan:  1. COVID-19 (Primary) Will obtain chest x-ray to rule out possible pneumonia contributing to patient's symptoms and COVID-positive status.  Patient currently stable and no signs of distress upon exam.  Recommend alternating Tylenol , ibuprofen  as needed for fever and chills.  Recommend starting vitamin supplements such as vitamin C, zinc, vitamin D and baby aspirin .  Will send in albuterol  inhaler to use as needed for shortness of breath and wheezing.  We discussed the need to obtain a pulse oximeter at home to check patient's oxygen during the day and if shortness of breath is worsening to go to the ER.  If SpO2 is less than 95%, I recommend calling PCP and taking patient to the ER.  They verbalized understanding.  We discussed the possibility of Paxlovid benefit, patient  and family declined at this time.  Symptom management reviewed with patient as well as red flag signs and symptoms and family and they verbalized understanding.  - albuterol  (VENTOLIN  HFA) 108 (90 Base) MCG/ACT inhaler; Inhale 2 puffs into the lungs every 6 (six) hours as needed for wheezing or shortness of breath.  Dispense: 8 g; Refill: 2 - DG Chest 2 View; Future   Meds ordered this encounter  Medications   albuterol  (VENTOLIN  HFA) 108 (90 Base) MCG/ACT inhaler    Sig: Inhale 2 puffs into the lungs every 6 (six) hours as  needed for wheezing or shortness of breath.    Dispense:  8 g    Refill:  2    Supervising Provider:   DE PERU, [name] [[id number]]   Lab Orders  No laboratory test(s) ordered today   Return if symptoms worsen or fail to improve.    Patient to reach out to office if new, worrisome, or unresolved symptoms arise or if no improvement in patient's condition. Patient verbalized understanding and is agreeable to treatment plan. All questions answered to patient's satisfaction.    Thersia Schuyler Stark, [place]

## 2024-01-02 ENCOUNTER — Telehealth: Payer: Self-pay | Admitting: Neurology

## 2024-01-02 NOTE — Telephone Encounter (Signed)
 Patient and the patient daughter called wanted to know what the Duloxetine  is for?  They also wanted to know if he can take the or should be taking the Gabapent ( the [place] gave him ) with this medication   please  call

## 2024-01-02 NOTE — Telephone Encounter (Signed)
 It is for painful neuropathy.  Yes, he should be taking duloxetine  30mg  twice daily and gabapentin  as prescribed by the [place].

## 2024-01-03 NOTE — Telephone Encounter (Signed)
 Called patients daughter Nat and informed her that per Dr. Tobie  Duloxetine  is for painful neuropathy and yes patient should be taking  duloxetine  30mg  twice daily and gabapentin  as prescribed by the [facility].    Patients daughter verbalized understanding and had no further questions or concerns.

## 2024-01-07 ENCOUNTER — Encounter (HOSPITAL_BASED_OUTPATIENT_CLINIC_OR_DEPARTMENT_OTHER): Payer: Self-pay | Admitting: Family Medicine

## 2024-01-07 ENCOUNTER — Ambulatory Visit (HOSPITAL_BASED_OUTPATIENT_CLINIC_OR_DEPARTMENT_OTHER): Admitting: Family Medicine

## 2024-01-07 VITALS — BP 123/75 | HR 79 | Ht 69.0 in | Wt 247.0 lb

## 2024-01-07 DIAGNOSIS — E1142 Type 2 diabetes mellitus with diabetic polyneuropathy: Secondary | ICD-10-CM | POA: Diagnosis not present

## 2024-01-07 LAB — POCT GLYCOSYLATED HEMOGLOBIN (HGB A1C)
HbA1c POC (<> result, manual entry): 6.2 % (ref 4.0–5.6)
HbA1c, POC (controlled diabetic range): 6.2 % (ref 0.0–7.0)
HbA1c, POC (prediabetic range): 6.2 % (ref 5.7–6.4)
Hemoglobin A1C: 6.2 % — AB (ref 4.0–5.6)

## 2024-01-07 MED ORDER — FREESTYLE LIBRE 3 READER DEVI: 0 refills | Status: DC

## 2024-01-07 MED ORDER — FREESTYLE LIBRE 3 PLUS SENSOR MISC: 2 refills | Status: AC

## 2024-01-07 NOTE — Patient Instructions (Signed)

## 2024-01-07 NOTE — Assessment & Plan Note (Signed)
 Patient continues with metformin , denies any issues with medication.  He does check blood sugar occasionally at home, no concerns with notably high or low readings. Recent hemoglobin A1c has generally been well-controlled, slight fluctuations on recent checks.  A1c in office today remains at goal at 6.2%. He does have some questions today about possible CGM device.  We did discuss considerations in this regard and did review that he does not strictly meet typical requirements for utilizing CGM device.  Did discuss general considerations related to monitoring blood sugar as he is only taking metformin  currently and would be at low risk for experiencing notable hypoglycemic episodes.  He also is not needing to check blood sugar regularly as it would pertain to dosing of medication.  We can proceed with prescription for device and see if this does get approved We will plan to follow-up in about 3 to 4 months.  Plan to check urine ACR and foot exam at next office visit

## 2024-01-07 NOTE — Progress Notes (Signed)
    Procedures performed today:    None.  Independent interpretation of notes and tests performed by another provider:   None.  Brief History, Exam, Impression, and Recommendations:    BP 123/75 (BP Location: Right Arm, Patient Position: Sitting, Cuff Size: Large)   Pulse 79   Ht 5' 9 (1.753 m)   Wt 247 lb (112 kg)   SpO2 91%   BMI 36.48 kg/m   Patient recently seen in the office by Thersia for illness, he did test positive for coronavirus.  He indicates that he feels that he has been recovering.  Still with some symptoms, however everything does seem to be improving gradually.  Does still note cough which has lessened. On exam today, patient is no acute distress, vital signs stable.  Cardiovascular exam with regular rate and rhythm, lungs clear to auscultation bilaterally.  Patient able to speak in full sentences, no increased work of breathing.  Type 2 diabetes mellitus with diabetic polyneuropathy, without long-term current use of insulin  [facility]) Assessment & Plan: Patient continues with metformin , denies any issues with medication.  He does check blood sugar occasionally at home, no concerns with notably high or low readings. Recent hemoglobin A1c has generally been well-controlled, slight fluctuations on recent checks.  A1c in office today remains at goal at 6.2%. He does have some questions today about possible CGM device.  We did discuss considerations in this regard and did review that he does not strictly meet typical requirements for utilizing CGM device.  Did discuss general considerations related to monitoring blood sugar as he is only taking metformin  currently and would be at low risk for experiencing notable hypoglycemic episodes.  He also is not needing to check blood sugar regularly as it would pertain to dosing of medication.  We can proceed with prescription for device and see if this does get approved We will plan to follow-up in about 3 to 4 months.  Plan to check urine  ACR and foot exam at next office visit  Orders: -     POCT glycosylated hemoglobin (Hb A1C) -     FreeStyle Libre 3 Plus Sensor; Change sensor every 15 days.  Dispense: 6 each; Refill: 2 -     FreeStyle Libre 3 Reader; Use to check blood sugar readings with CGM device as instructed.  Dispense: 1 each; Refill: 0  Return in about 3 months (around 04/08/2024).   ___________________________________________ Miryam Mcelhinney de [place], MD, ABFM, CAQSM Primary Care and Sports Medicine [employer]

## 2024-02-12 ENCOUNTER — Telehealth: Payer: Self-pay | Admitting: Neurology

## 2024-02-12 NOTE — Telephone Encounter (Signed)
 Called patients daughter Nat and left a message for a call back.

## 2024-02-12 NOTE — Telephone Encounter (Signed)
 Pt.s daughter calling as Pt is having issue walking due to neuropathy

## 2024-02-13 NOTE — Telephone Encounter (Signed)
 Called patients daughter Nat and she informed me that patient has been having difficulty walking due to his neuropathy becoming so bad. Patient is currently taking Duloxetine  30 mg in the morning, 30 mg in the afternoon, and 60 mg in the evening. Patient has also been taking Gabapentin  twice a day but daughter is unsure how much.   Patients daughter stated that Gust has been needing to use a wheelchair.

## 2024-02-14 NOTE — Telephone Encounter (Signed)
 Please let daughter know that maximum dose of duloxetine  to 90mg /d.  He can take 30mg  in the morning and 60mg  at bedtime.  Gabapentin  is 300mg  in the morning, 300mg  in the afternoon, and 600mg  at bedtime.  If pain is severe, we can increase gabapentin  to 600mg  three times daily.  Walking difficulty and imbalance is associated with neuropathy.  We can refer him to PT for balance training, if they would like.

## 2024-02-15 NOTE — Telephone Encounter (Signed)
Called patients daughter and left a message for a call back. 

## 2024-02-15 NOTE — Telephone Encounter (Signed)
 Called patients daughter and informed her of Dr. Anthony recommendations per below.  Please let daughter know that maximum dose of duloxetine  to 90mg /d.  He can take 30mg  in the morning and 60mg  at bedtime.   Gabapentin  is 300mg  in the morning, 300mg  in the afternoon, and 600mg  at bedtime.  If pain is severe, we can increase gabapentin  to 600mg  three times daily.   Walking difficulty and imbalance is associated with neuropathy.  We can refer him to PT for balance training, if they would like.  Patients daughter verbalized understanding and wants me to send a MyChart message with information. Patients daughter will contact us  if she would like to proceed with PT. Patients daughter will relay message to patient and has verbalized understanding all explained with no further questions.

## 2024-02-19 ENCOUNTER — Telehealth: Payer: Self-pay | Admitting: Neurology

## 2024-02-19 NOTE — Telephone Encounter (Signed)
 Pt.s daughter wants to discus pt for father

## 2024-02-20 NOTE — Telephone Encounter (Signed)
Called patients daughter and left a message for a call back. 

## 2024-02-21 ENCOUNTER — Telehealth: Payer: Self-pay | Admitting: Neurology

## 2024-02-21 DIAGNOSIS — E1142 Type 2 diabetes mellitus with diabetic polyneuropathy: Secondary | ICD-10-CM

## 2024-02-21 NOTE — Telephone Encounter (Signed)
 Pt' s daughter would like a  physical therapy to come to her Dad's home for his Neuropathy . Please call Nat anything before 9am. Thanks

## 2024-02-21 NOTE — Telephone Encounter (Signed)
 Left a VM stating that she was returning a call to [facility]  please call back about 1:00

## 2024-02-22 NOTE — Telephone Encounter (Signed)
 LMOVM for patient daughter PT order approved and sent to [facility] home health.

## 2024-02-22 NOTE — Telephone Encounter (Signed)
See 2nd telephone encounter  

## 2024-02-22 NOTE — Telephone Encounter (Signed)
 OK to send home PT orders for gait training.  Thanks.

## 2024-02-25 ENCOUNTER — Telehealth: Payer: Self-pay | Admitting: Neurology

## 2024-02-25 NOTE — Telephone Encounter (Signed)
 PT orders 1wk1 2wk2 1wk2 need verbal order  secure VM

## 2024-02-26 NOTE — Telephone Encounter (Signed)
 Called Kenneth Henson and provided verbal orders. Kenneth Henson thanked me for the call and had no further questions or concerns.

## 2024-02-28 ENCOUNTER — Other Ambulatory Visit (HOSPITAL_BASED_OUTPATIENT_CLINIC_OR_DEPARTMENT_OTHER): Payer: Self-pay | Admitting: Family Medicine

## 2024-03-17 ENCOUNTER — Encounter: Payer: Self-pay | Admitting: Radiology

## 2024-03-18 ENCOUNTER — Telehealth: Payer: Self-pay | Admitting: Neurology

## 2024-03-18 NOTE — Telephone Encounter (Signed)
 Phys therapist called for orders PT 1 wk 4, secure line for VM

## 2024-03-18 NOTE — Telephone Encounter (Signed)
OK to continue? 

## 2024-04-04 ENCOUNTER — Ambulatory Visit: Payer: Self-pay

## 2024-04-04 NOTE — Telephone Encounter (Signed)
 FYI Only or Action Required?: Action required by provider: update on patient condition.  Patient was last seen in primary care on 01/07/2024 by de Cuba, Quintin PARAS, MD.  Called Nurse Triage reporting Rash.  Symptoms began several months ago.  Interventions attempted: OTC medications: neosporin, hydrogen peroxide.  Symptoms are: pinpoint bumps on face and neck (forehead, under eyes and back of head/hairline) itchy gradually worsening.  Triage Disposition: See PCP When Office is Open (Within 3 Days)  Patient/caregiver understands and will follow disposition?: No, wishes to speak with PCP          Reason for Disposition  Localized rash present > 7 days  Answer Assessment - Initial Assessment Questions Offered appointment to be evaluated/treated by other available providers. Daughter declined and states patient wants to speak with his PCP only, has appt for 04/16/24 and plans to follow up with PCP then for rash. Advised call back for new or worsening symptoms.   1. APPEARANCE of RASH: What does the rash look like? (e.g., blisters, dry flaky skin, red spots, redness, sores)     Bumps; clear fluid filled mostly, 1-2 look like they are white or pus filled.  2. LOCATION: Where is the rash located?      Face and neck. (Forehead, under eyes, base of head/hairline)  3. NUMBER: How many spots are there?      5 bumps on face, 2-3 on neck/back of head.  4. SIZE: How big are the spots? (e.g., inches, cm; or compare to size of pinhead, tip of pen, eraser, pea)      Tip of pen.  5. ONSET: When did the rash start?      Couple of months ago.  6. ITCHING: Does the rash itch? If Yes, ask: How bad is the itch?  (Scale 0-10; or none, mild, moderate, severe)     Yes, moderate to severe. She states the bumps were appearing red from the scratching.  7. PAIN: Does the rash hurt? If Yes, ask: How bad is the pain?  (Scale 0-10; or none, mild, moderate, severe)     No.  8. OTHER  SYMPTOMS: Do you have any other symptoms? (e.g., fever)     No fever. Body aches 2 days ago. Denies SOB or breathing difficulty, tongue or facial swelling.  Daughter had patient dabbing bumps with hydrogen peroxide after trying to treat first with Neosporin.  Protocols used: Rash or Redness - Localized-A-AH

## 2024-04-04 NOTE — Telephone Encounter (Signed)
 1st attempt to contact the patient, no answer, LVM to call back. Routing for additional attempts.      Summary: Rash on face and neck, seeking derm referral   Reason for Triage: Seeking dermatology referral, currently experiencing a rash on face and neck.  Best contact: [phone]

## 2024-04-16 ENCOUNTER — Ambulatory Visit (HOSPITAL_BASED_OUTPATIENT_CLINIC_OR_DEPARTMENT_OTHER): Payer: Self-pay | Admitting: Family Medicine

## 2024-04-16 ENCOUNTER — Encounter (HOSPITAL_BASED_OUTPATIENT_CLINIC_OR_DEPARTMENT_OTHER): Payer: Self-pay | Admitting: Family Medicine

## 2024-04-16 ENCOUNTER — Ambulatory Visit (INDEPENDENT_AMBULATORY_CARE_PROVIDER_SITE_OTHER): Admitting: Family Medicine

## 2024-04-16 VITALS — BP 142/110 | HR 68 | Temp 99.0°F | Resp 18 | Ht 69.0 in | Wt 254.0 lb

## 2024-04-16 DIAGNOSIS — M7989 Other specified soft tissue disorders: Secondary | ICD-10-CM | POA: Insufficient documentation

## 2024-04-16 DIAGNOSIS — E1142 Type 2 diabetes mellitus with diabetic polyneuropathy: Secondary | ICD-10-CM

## 2024-04-16 LAB — POCT GLYCOSYLATED HEMOGLOBIN (HGB A1C): Hemoglobin A1C: 6.7 % — AB (ref 4.0–5.6)

## 2024-04-16 MED ORDER — LISINOPRIL 40 MG PO TABS: 40.0000 mg | ORAL_TABLET | Freq: Every day | ORAL | 3 refills | Status: AC

## 2024-04-16 MED ORDER — DULOXETINE HCL 30 MG PO CPEP: 30.0000 mg | ORAL_CAPSULE | Freq: Two times a day (BID) | ORAL | 1 refills | Status: AC

## 2024-04-16 MED ORDER — AMLODIPINE BESYLATE 5 MG PO TABS: 5.0000 mg | ORAL_TABLET | Freq: Every day | ORAL | 1 refills | Status: AC

## 2024-04-16 MED ORDER — METFORMIN HCL 850 MG PO TABS: 850.0000 mg | ORAL_TABLET | Freq: Every day | ORAL | 1 refills | Status: AC

## 2024-04-16 MED ORDER — METHOCARBAMOL 500 MG PO TABS: 500.0000 mg | ORAL_TABLET | Freq: Four times a day (QID) | ORAL | 0 refills | Status: DC

## 2024-04-16 MED ORDER — ALLOPURINOL 300 MG PO TABS: 300.0000 mg | ORAL_TABLET | Freq: Two times a day (BID) | ORAL | 3 refills | Status: DC | PRN

## 2024-04-16 MED ORDER — TRAMADOL HCL 50 MG PO TABS: 50.0000 mg | ORAL_TABLET | Freq: Four times a day (QID) | ORAL | 0 refills | Status: AC | PRN

## 2024-04-16 MED ORDER — GABAPENTIN 300 MG PO CAPS: ORAL_CAPSULE | ORAL | 3 refills | Status: AC

## 2024-04-16 MED ORDER — TRAZODONE HCL 50 MG PO TABS: 50.0000 mg | ORAL_TABLET | Freq: Every day | ORAL | 1 refills | Status: AC

## 2024-04-16 MED ORDER — ALBUTEROL SULFATE HFA 108 (90 BASE) MCG/ACT IN AERS: 2.0000 | INHALATION_SPRAY | Freq: Four times a day (QID) | RESPIRATORY_TRACT | 2 refills | Status: AC | PRN

## 2024-04-16 NOTE — Progress Notes (Unsigned)
    Procedures performed today:    None.  Independent interpretation of notes and tests performed by another provider:   None.  Brief History, Exam, Impression, and Recommendations:    BP (!) 142/110 (BP Location: Left Arm, Patient Position: Sitting, Cuff Size: Large)   Pulse 68   Temp 99 F (37.2 C) (Oral)   Resp 18   Ht 5' 9 (1.753 m)   Wt 254 lb (115.2 kg)   SpO2 96%   BMI 37.51 kg/m   Type 2 diabetes mellitus with diabetic polyneuropathy, without long-term current use of insulin  [facility]) Assessment & Plan: Patient continues with metformin , denies any issues with medication.  He does check blood sugar occasionally at home, no concerns with notably high or low readings. Recent hemoglobin A1c has generally been well-controlled, slight fluctuations on recent checks.  A1c in office today remains at goal at 6.7%. We will plan to follow-up in about 3 to 4 months. Urine ACR checked today, foot exam completed.  Orders: -     Microalbumin / creatinine urine ratio -     POCT glycosylated hemoglobin (Hb A1C)  Leg swelling -     Ambulatory referral to Vascular Surgery  Other orders -     Albuterol  Sulfate HFA; Inhale 2 puffs into the lungs every 6 (six) hours as needed for wheezing or shortness of breath.  Dispense: 8 g; Refill: 2 -     Allopurinol ; Take 1 tablet (300 mg total) by mouth every 12 (twelve) hours as needed (gout).  Dispense: 30 tablet; Refill: 3 -     amLODIPine  Besylate; Take 1 tablet (5 mg total) by mouth daily.  Dispense: 90 tablet; Refill: 1 -     DULoxetine  HCl; Take 1 capsule (30 mg total) by mouth 2 (two) times daily.  Dispense: 180 capsule; Refill: 1 -     Gabapentin ; Take 1 tab before bed x 2 weeks.  May increase to 1 tab at dinner for 2 weeks and then may increase to am, dinner and before bed thereafter for pain.  Dispense: 90 capsule; Refill: 3 -     Lisinopril ; Take 1 tablet (40 mg total) by mouth daily.  Dispense: 90 tablet; Refill: 3 -     metFORMIN  HCl;  Take 1 tablet (850 mg total) by mouth daily with breakfast.  Dispense: 90 tablet; Refill: 1 -     Methocarbamol ; Take 1 tablet (500 mg total) by mouth 4 (four) times daily.  Dispense: 40 tablet; Refill: 0 -     traMADol  HCl; Take 1 tablet (50 mg total) by mouth every 6 (six) hours as needed.  Dispense: 15 tablet; Refill: 0 -     traZODone  HCl; Take 1 tablet (50 mg total) by mouth at bedtime.  Dispense: 90 tablet; Refill: 1  Return in about 4 months (around 08/15/2024) for diabetes, hypertension.   ___________________________________________ Ethanael Veith de [place], MD, ABFM, CAQSM Primary Care and Sports Medicine [facility]

## 2024-04-16 NOTE — Assessment & Plan Note (Signed)
 Patient continues with metformin , denies any issues with medication.  He does check blood sugar occasionally at home, no concerns with notably high or low readings. Recent hemoglobin A1c has generally been well-controlled, slight fluctuations on recent checks.  A1c in office today remains at goal at 6.7%. We will plan to follow-up in about 3 to 4 months. Urine ACR checked today, foot exam completed.

## 2024-04-17 LAB — MICROALBUMIN / CREATININE URINE RATIO
Creatinine, Urine: 135.6 mg/dL
Microalb/Creat Ratio: 192 mg/g{creat} — ABNORMAL HIGH (ref 0–29)
Microalbumin, Urine: 260.4 ug/mL

## 2024-04-24 NOTE — Assessment & Plan Note (Signed)
 Patient has been having issues with leg swelling.  He notes that it would be worse as the day goes on.  Does have some improvement of elevating the legs.  Has also had some skin changes over her lower legs.  Denies any significant oozing or discharge.  No significant associated pain. On exam, patient does have 1-2+ pitting edema in bilateral lower extremities.  Does have some skin flaking, signs of stasis dermatitis. We discussed general considerations and recommendations.  Can proceed with referral to vascular specialist for further evaluation and recommendations.  Patient would likely benefit from compression stockings to assist with lower extremity swelling.

## 2024-04-25 ENCOUNTER — Other Ambulatory Visit (HOSPITAL_BASED_OUTPATIENT_CLINIC_OR_DEPARTMENT_OTHER): Payer: Self-pay | Admitting: Family Medicine

## 2024-05-05 ENCOUNTER — Encounter: Payer: Self-pay | Admitting: Family Medicine

## 2024-05-05 ENCOUNTER — Ambulatory Visit: Admitting: Family Medicine

## 2024-05-05 ENCOUNTER — Ambulatory Visit: Payer: Self-pay

## 2024-05-05 VITALS — BP 132/64 | HR 69 | Temp 98.3°F | Resp 20 | Ht 69.0 in | Wt 251.0 lb

## 2024-05-05 DIAGNOSIS — I872 Venous insufficiency (chronic) (peripheral): Secondary | ICD-10-CM

## 2024-05-05 DIAGNOSIS — E1142 Type 2 diabetes mellitus with diabetic polyneuropathy: Secondary | ICD-10-CM | POA: Diagnosis not present

## 2024-05-05 DIAGNOSIS — H353230 Exudative age-related macular degeneration, bilateral, stage unspecified: Secondary | ICD-10-CM | POA: Diagnosis not present

## 2024-05-05 NOTE — Patient Instructions (Signed)
 Kenneth Henson

## 2024-05-05 NOTE — Assessment & Plan Note (Signed)
 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 Neurologist follow-up scheduled. - Continue gabapentin  at current dose. - Continue duloxetine . - Follow up with neurologist on February 2nd.

## 2024-05-05 NOTE — Assessment & Plan Note (Signed)
 Managed by ophthalmologist. Encouraged to keep upcoming appointment.

## 2024-05-05 NOTE — Telephone Encounter (Signed)
 FYI Only or Action Required?: FYI only for provider: appointment scheduled on 05/05/24.  Patient was last seen in primary care on 04/16/2024 by de Cuba, Quintin PARAS, MD.  Called Nurse Triage reporting Leg Swelling.  Symptoms began about a month ago.  Interventions attempted: Rest, hydration, or home remedies.  Symptoms are: gradually worsening.  Triage Disposition: See Physician Within 24 Hours  Patient/caregiver understands and will follow disposition?: Writer spoke with the daughter Kenneth Henson - appt has been made for the pt today 05/05/24. Pt's daughter has no further questions or concerns.   Reason for Disposition  [1] MODERATE leg swelling (e.g., swelling extends up to knees) AND [2] new-onset or getting worse  Answer Assessment - Initial Assessment Questions Writer spoke with the patient and is experiencing swelling in both legs and feet (calves down to the feet) - worse in the right leg. Pain 8 or 9. Difficulty walking. Willing to see any provider. Writer will call daughter Kenneth Henson to schedule an appointment for the pt.   1. ONSET: When did the swelling start? (e.g., minutes, hours, days)     A month or more 2. LOCATION: What part of the leg is swollen?  Are both legs swollen or just one leg?     Calves down to the feet on both legs 3. SEVERITY: How bad is the swelling? (e.g., localized; mild, moderate, severe)     moderate 4. REDNESS: Is there redness or signs of infection?     Cannot see d/t being legally blind 5. PAIN: Is the swelling painful to touch? If Yes, ask: How painful is it?   (Scale 1-10; mild, moderate or severe)     8 or 9 6. FEVER: Do you have a fever? If Yes, ask: What is it, how was it measured, and when did it start?      Denies  7. CAUSE: What do you think is causing the leg swelling?     N/a 8. MEDICAL HISTORY: Do you have a history of blood clots (e.g., DVT), cancer, heart failure, kidney disease, or liver failure?     denies 9.  RECURRENT SYMPTOM: Have you had leg swelling before? If Yes, ask: When was the last time? What happened that time?     Yes - had swelling in the past 10. OTHER SYMPTOMS: Do you have any other symptoms? (e.g., chest pain, difficulty breathing)       denies  Protocols used: Leg Swelling and Edema-A-AH  Writer spoke with daughter Kenneth Henson and was not present with the pt. Kenneth Henson states that the pt's feet are swollen and is hard to for the pt to walk and is unable to be seen by Vascular Surgery until April. Kenneth Henson states he has travel plans in January and is wondering what can be done for the pt in the mean time.   Call disconnected. Attempted to call daughter Kenneth Henson, left voicemail. Attempted to reach daughter Kenneth Henson, left voicemail.   Copied from CRM #[id number]. Topic: Clinical - Red Word Triage >> May 05, 2024  8:01 AM Kenneth Henson wrote: Red Word that prompted transfer to Nurse Triage: painful swollen legs and feet and warm to touch.

## 2024-05-05 NOTE — Progress Notes (Signed)
 "  Assessment & Plan Chronic venous insufficiency of lower extremity Bilateral lower extremity edema likely due to venous insufficiency, exacerbated by age-related valve dysfunction. No DVT signs. - Use compression hose daily, remove at night. - Consider device for compression hose application. - Elevate legs when sitting or lying down. - Maintain vascular specialist appointment in April. - Encouraged ambulation during long flights.    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 Neurologist follow-up scheduled. - Continue gabapentin  at current dose. - Continue duloxetine . - Follow up with neurologist on February 2nd.     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 Encouraged to keep upcoming appointment.     Follow up plan: Return if symptoms worsen or fail to improve.  Niki Rung, MSN, APRN, FNP-C  Subjective:  HPI: Kenneth Henson is a 83 y.o. male presenting on 05/05/2024 for Edema (Hx of Neuropathy - swelling started 1 month ago R>L tried to elevate /Last OV with PCP 04/16/24 - On exam, patient does have 1-2+ pitting edema in bilateral lower extremities.  Does have some skin flaking, signs of stasis dermatitis)  Discussed the use of AI scribe software for clinical note transcription with the patient, who gave verbal consent to proceed.  Patient is accompanied by his grandson, who he is okay with being present.  He has been experiencing swelling in his legs, more pronounced in the right leg, for approximately one month. Elevating his legs provides temporary relief, but the associated pain persists. He has not yet tried using compression hose as he lives alone and is unable to apply regular socks.  He describes pain in his legs due to neuropathy. He is currently taking gabapentin  for neuropathy pain, at a  dose of two tablets three times a day, totaling six tablets daily. This regimen provides some relief and aids in sleep. He has increased the dosage himself from the initially prescribed amount. He also takes Cymbalta  (duloxetine ) for pain management.  He has a history of diabetes, which he acknowledges as a contributing factor to his neuropathy. He is scheduled to see his neurologist for follow-up on his neuropathy in February. He also has an upcoming appointment with a vascular specialist in April.  No chest pain or shortness of breath. He mentions difficulty with vision and has an eye doctor for this issue.      ROS: Negative unless specifically indicated above in HPI.   Relevant past medical history reviewed and updated as indicated.   Allergies and medications reviewed and updated.  Current Medications[1]  Allergies[2]  Objective:   BP 132/64   Pulse 69   Temp 98.3 F (36.8 C)   Resp 20   Ht 5' 9 (1.753 m)   Wt 251 lb (113.9 kg)   SpO2 97%   BMI 37.07 kg/m    Physical Exam Vitals reviewed.  Constitutional:      General: He is not in acute distress.    Appearance: Normal appearance. He is not ill-appearing, toxic-appearing or diaphoretic.  HENT:     Head: Normocephalic and atraumatic.  Eyes:     General: No scleral icterus.       Right eye: No discharge.        Left eye: No discharge.     Conjunctiva/sclera: Conjunctivae normal.  Cardiovascular:     Rate and Rhythm: Normal rate.  Pulmonary:     Effort: Pulmonary effort is normal. No  respiratory distress.  Musculoskeletal:        General: Normal range of motion.     Cervical back: Normal range of motion.     Right lower leg: 1+ Pitting Edema present.     Left lower leg: 1+ Pitting Edema present.  Skin:    General: Skin is warm and dry.  Neurological:     Mental Status: He is alert and oriented to person, place, and time. Mental status is at baseline.  Psychiatric:        Mood and Affect: Mood normal.         Behavior: Behavior normal.        Thought Content: Thought content normal.        Judgment: Judgment normal.            [1]  Current Outpatient Medications:    albuterol  (VENTOLIN  HFA) 108 (90 Base) MCG/ACT inhaler, Inhale 2 puffs into the lungs every 6 (six) hours as needed for wheezing or shortness of breath., Disp: 8 g, Rfl: 2   allopurinol  (ZYLOPRIM ) 300 MG tablet, TAKE 1 TABLET (300 MG TOTAL) BY MOUTH EVERY 12 (TWELVE) HOURS AS NEEDED (GOUT)., Disp: 180 tablet, Rfl: 1   amLODipine  (NORVASC ) 5 MG tablet, Take 1 tablet (5 mg total) by mouth daily., Disp: 90 tablet, Rfl: 1   aspirin  EC 81 MG tablet, Take 81 mg by mouth daily. Swallow whole., Disp: , Rfl:    diphenhydrAMINE  (BENADRYL ) 25 mg capsule, Take 25 mg by mouth 2 (two) times daily as needed for itching (eczema)., Disp: , Rfl:    diphenhydrAMINE  HCl, Sleep, (ZZZQUIL) 50 MG/30ML LIQD, Take 50 mg by mouth daily as needed (sleep)., Disp: , Rfl:    DULoxetine  (CYMBALTA ) 30 MG capsule, Take 1 capsule (30 mg total) by mouth 2 (two) times daily., Disp: 180 capsule, Rfl: 1   gabapentin  (NEURONTIN ) 300 MG capsule, Take 1 tab before bed x 2 weeks.  May increase to 1 tab at dinner for 2 weeks and then may increase to am, dinner and before bed thereafter for pain., Disp: 90 capsule, Rfl: 3   lisinopril  (ZESTRIL ) 40 MG tablet, Take 1 tablet (40 mg total) by mouth daily., Disp: 90 tablet, Rfl: 3   MELATONIN PO, Take 1 tablet by mouth at bedtime as needed (sleep)., Disp: , Rfl:    metFORMIN  (GLUCOPHAGE ) 850 MG tablet, Take 1 tablet (850 mg total) by mouth daily with breakfast., Disp: 90 tablet, Rfl: 1   naproxen  (NAPROSYN ) 500 MG tablet, Take 1 tablet (500 mg total) by mouth 2 (two) times daily., Disp: 30 tablet, Rfl: 0   traMADol  (ULTRAM ) 50 MG tablet, Take 1 tablet (50 mg total) by mouth every 6 (six) hours as needed., Disp: 15 tablet, Rfl: 0   traZODone  (DESYREL ) 50 MG tablet, Take 1 tablet (50 mg total) by mouth at bedtime., Disp: 90 tablet,  Rfl: 1   chlorhexidine  (HIBICLENS ) 4 % external liquid, Apply 15 mLs (1 Application total) topically as directed for 30 doses. Use as directed daily for 5 days every other week for 6 weeks., Disp: 946 mL, Rfl: 1   Continuous Glucose Receiver (FREESTYLE LIBRE 3 READER) DEVI, Use to check blood sugar readings with CGM device as instructed., Disp: 1 each, Rfl: 0   Continuous Glucose Sensor (FREESTYLE LIBRE 3 PLUS SENSOR) MISC, Change sensor every 15 days., Disp: 6 each, Rfl: 2 [2]  Allergies Allergen Reactions   Doxycycline Nausea And Vomiting   Oxycodone -Acetaminophen  Itching   "

## 2024-06-04 ENCOUNTER — Other Ambulatory Visit (HOSPITAL_COMMUNITY): Payer: Self-pay | Admitting: Family Medicine

## 2024-06-04 ENCOUNTER — Ambulatory Visit

## 2024-06-04 ENCOUNTER — Ambulatory Visit (HOSPITAL_COMMUNITY)
Admission: RE | Admit: 2024-06-04 | Discharge: 2024-06-04 | Disposition: A | Discharge status: Home / Self Care | Source: Ambulatory Visit | Attending: Surgery | Admitting: Surgery

## 2024-06-04 VITALS — BP 135/77 | HR 68 | Temp 97.8°F | Ht 69.0 in | Wt 253.0 lb

## 2024-06-04 DIAGNOSIS — R609 Edema, unspecified: Secondary | ICD-10-CM | POA: Insufficient documentation

## 2024-06-04 DIAGNOSIS — I872 Venous insufficiency (chronic) (peripheral): Secondary | ICD-10-CM | POA: Insufficient documentation

## 2024-06-04 NOTE — Progress Notes (Signed)
 " Office Note     CC:  follow up Requesting Provider:  de [place], Quintin PARAS, MD  HPI: Kenneth Henson is a 84 y.o. (09-05-1940) male who was referred to clinic for evaluation of right lower extremity edema.  He has had swelling in his right leg for many years however has noticed worsening skin changes over the past several months.  He denies any history of DVT, venous ulcerations, trauma, or prior vascular interventions.  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  This limits his mobility.  He is seen today in a wheelchair but does walk with a cane.  He has compression socks however does not wear them much due to the difficulty putting them on.  He elevates his legs when possible during the day.  He is a former smoker.   Past Medical History:  Diagnosis Date   Abnormal involuntary movements(781.0)    Anginal pain    30 years ago   Arthritis    Chicken pox    Claudication    Colon polyps    Diabetes mellitus type II    Erectile dysfunction    Hyperlipidemia    Hypertension    Neoplasm of skin    uncertain behavior   PTSD (post-traumatic stress disorder)    Sleep apnea    Stroke [facility])     Past Surgical History:  Procedure Laterality Date   CARPAL TUNNEL RELEASE     right   DECOMPRESSIVE LUMBAR LAMINECTOMY LEVEL 1 N/A 07/10/2023   Procedure: Lumbar  four-five DECOMPRESSIVE LUMBAR LAMINECTOMY LEVEL;  Surgeon: Georgina Ozell LABOR, MD;  Location: [facility] OR;  Service: Orthopedics;  Laterality: N/A;   HAND SURGERY     KNEE SURGERY     LIPOMA EXCISION Right 04/25/2023   Procedure: EXCISION LIPOMA POSTERIOR RIGHT NECK;  Surgeon: Ann Fine, MD;  Location: [facility];  Service: General;  Laterality: Right;  LMA   MINOR HARDWARE REMOVAL Right 09/06/2022   Procedure: MINOR HARDWARE REMOVAL;  Surgeon: Cristy Bonner DASEN, MD;  Location: [facility] ORS;  Service: Orthopedics;  Laterality: Right;   NECK SURGERY     ORIF HUMERUS FRACTURE Right 09/06/2022   Procedure:  OPEN REDUCTION INTERNAL FIXATION (ORIF) PROXIMAL HUMERUS FRACTURE;  Surgeon: Cristy Bonner DASEN, MD;  Location: [facility] ORS;  Service: Orthopedics;  Laterality: Right;   SHOULDER SURGERY Right    2020   SHOULDER SURGERY Left    2011   TOTAL SHOULDER REVISION Right 09/06/2022   Procedure: TOTAL SHOULDER REVISION;  Surgeon: Cristy Bonner DASEN, MD;  Location: [facility] ORS;  Service: Orthopedics;  Laterality: Right;    Social History   Socioeconomic History   Marital status: Single    Spouse name: Not on file   Number of children: 4   Years of education: Not on file   Highest education level: Not on file  Occupational History   Not on file  Tobacco Use   Smoking status: Former    Current packs/day: 0.00    Types: Cigarettes    Quit date: 05/15/2009    Years since quitting: 15.0    Passive exposure: Past   Smokeless tobacco: Never  Vaping Use   Vaping status: Never Used  Substance and Sexual Activity   Alcohol use: Yes    Alcohol/week: 1.0 standard drink of alcohol    Types: 1 Shots of liquor per week    Comment: vodka, weekly   Drug use: No   Sexual activity:  Not on file  Other Topics Concern   Not on file  Social History Narrative   Are you right handed or left handed? Right Handed    Are you currently employed ? No    What is your current occupation? Retired    Do you live at home alone? Yes   Who lives with you?    What type of home do you live in: 1 story or 2 story? Lives in a one story home. Daughter and son-law checks on him daily        Social Drivers of Health   Tobacco Use: Medium Risk (06/04/2024)   Patient History    Smoking Tobacco Use: Former    Smokeless Tobacco Use: Never    Passive Exposure: Past  Physicist, Medical Strain: Low Risk (07/09/2023)   Overall Financial Resource Strain (CARDIA)    Difficulty of Paying Living Expenses: Not hard at all  Food Insecurity: No Food Insecurity (07/09/2023)   Hunger Vital Sign    Worried About Running Out of Food in the Last Year:  Never true    Ran Out of Food in the Last Year: Never true  Transportation Needs: No Transportation Needs (07/09/2023)   PRAPARE - Administrator, Civil Service (Medical): No    Lack of Transportation (Non-Medical): No  Physical Activity: Inactive (07/09/2023)   Exercise Vital Sign    Days of Exercise per Week: 0 days    Minutes of Exercise per Session: 0 min  Stress: No Stress Concern Present (07/09/2023)   [employer] of Occupational Health - Occupational Stress Questionnaire    Feeling of Stress : Not at all  Social Connections: Moderately Isolated (07/09/2023)   Social Connection and Isolation Panel    Frequency of Communication with Friends and Family: More than three times a week    Frequency of Social Gatherings with Friends and Family: More than three times a week    Attends Religious Services: Never    Database Administrator or Organizations: Yes    Attends Engineer, Structural: More than 4 times per year    Marital Status: Divorced  Intimate Partner Violence: Not At Risk (07/09/2023)   Humiliation, Afraid, Rape, and Kick questionnaire    Fear of Current or Ex-Partner: No    Emotionally Abused: No    Physically Abused: No    Sexually Abused: No  Depression (PHQ2-9): Low Risk (05/05/2024)   Depression (PHQ2-9)    PHQ-2 Score: 0  Alcohol Screen: Low Risk (07/09/2023)   Alcohol Screen    Last Alcohol Screening Score (AUDIT): 0  Housing: Low Risk (07/09/2023)   Housing Stability Vital Sign    Unable to Pay for Housing in the Last Year: No    Number of Times Moved in the Last Year: 0    Homeless in the Last Year: No  Utilities: Not At Risk (07/09/2023)   AHC Utilities    Threatened with loss of utilities: No  Health Literacy: Inadequate Health Literacy (07/09/2023)   B1300 Health Literacy    Frequency of need for help with medical instructions: Always    Family History  Problem Relation Age of Onset   Hypertension Mother    Heart disease Mother     Stroke Father    Breast cancer Sister    Colon polyps Sister    Colon cancer Neg Hx    Stomach cancer Neg Hx     Current Outpatient Medications  Medication Sig Dispense Refill  albuterol  (VENTOLIN  HFA) 108 (90 Base) MCG/ACT inhaler Inhale 2 puffs into the lungs every 6 (six) hours as needed for wheezing or shortness of breath. 8 g 2   allopurinol  (ZYLOPRIM ) 300 MG tablet TAKE 1 TABLET (300 MG TOTAL) BY MOUTH EVERY 12 (TWELVE) HOURS AS NEEDED (GOUT). 180 tablet 1   amLODipine  (NORVASC ) 5 MG tablet Take 1 tablet (5 mg total) by mouth daily. 90 tablet 1   aspirin  EC 81 MG tablet Take 81 mg by mouth daily. Swallow whole.     Continuous Glucose Sensor (FREESTYLE LIBRE 3 PLUS SENSOR) MISC Change sensor every 15 days. 6 each 2   diphenhydrAMINE  (BENADRYL ) 25 mg capsule Take 25 mg by mouth 2 (two) times daily as needed for itching (eczema).     diphenhydrAMINE  HCl, Sleep, (ZZZQUIL) 50 MG/30ML LIQD Take 50 mg by mouth daily as needed (sleep).     DULoxetine  (CYMBALTA ) 30 MG capsule Take 1 capsule (30 mg total) by mouth 2 (two) times daily. 180 capsule 1   gabapentin  (NEURONTIN ) 300 MG capsule Take 1 tab before bed x 2 weeks.  May increase to 1 tab at dinner for 2 weeks and then may increase to am, dinner and before bed thereafter for pain. 90 capsule 3   lisinopril  (ZESTRIL ) 40 MG tablet Take 1 tablet (40 mg total) by mouth daily. 90 tablet 3   MELATONIN PO Take 1 tablet by mouth at bedtime as needed (sleep).     metFORMIN  (GLUCOPHAGE ) 850 MG tablet Take 1 tablet (850 mg total) by mouth daily with breakfast. 90 tablet 1   naproxen  (NAPROSYN ) 500 MG tablet Take 1 tablet (500 mg total) by mouth 2 (two) times daily. 30 tablet 0   traMADol  (ULTRAM ) 50 MG tablet Take 1 tablet (50 mg total) by mouth every 6 (six) hours as needed. 15 tablet 0   traZODone  (DESYREL ) 50 MG tablet Take 1 tablet (50 mg total) by mouth at bedtime. 90 tablet 1   No current facility-administered medications for this visit.     Allergies[1]   REVIEW OF SYSTEMS:  Negative unless noted in HPI [X]  denotes positive finding, [ ]  denotes negative finding Cardiac  Comments:  Chest pain or chest pressure:    Shortness of breath upon exertion:    Short of breath when lying flat:    Irregular heart rhythm:        Vascular    Pain in calf, thigh, or hip brought on by ambulation:    Pain in feet at night that wakes you up from your sleep:     Blood clot in your veins:    Leg swelling:         Pulmonary    Oxygen at home:    Productive cough:     Wheezing:         Neurologic    Sudden weakness in arms or legs:     Sudden numbness in arms or legs:     Sudden onset of difficulty speaking or slurred speech:    Temporary loss of vision in one eye:     Problems with dizziness:         Gastrointestinal    Blood in stool:     Vomited blood:         Genitourinary    Burning when urinating:     Blood in urine:        Psychiatric    Major depression:  Hematologic    Bleeding problems:    Problems with blood clotting too easily:        Skin    Rashes or ulcers:        Constitutional    Fever or chills:      PHYSICAL EXAMINATION:  Vitals:   06/04/24 1340  BP: 135/77  Pulse: 68  Temp: 97.8 F (36.6 C)  SpO2: 94%  Weight: 253 lb (114.8 kg)  Height: 5' 9 (1.753 m)    General:  WDWN in NAD; vital signs documented above Gait: Not observed HENT: WNL, normocephalic Pulmonary: normal non-labored breathing Cardiac: regular HR Abdomen: soft, NT, no masses Skin: without rashes Vascular Exam/Pulses: Brisk DP and PT signal by Doppler Extremities: without ischemic changes, without Gangrene , without cellulitis; without open wounds; stasis pigmentation changes for right worse than the left lower leg; indurated skin to the level of pigmentation change on the right lower extremity; no open wounds Musculoskeletal: no muscle wasting or atrophy  Neurologic: A&O X 3 Psychiatric:  The pt has  Normal affect.   Non-Invasive Vascular Imaging:   Right lower extremity reflux study Negative for DVT Incompetent common femoral vein Incompetent GSV at the saphenofemoral junction and proximal thigh only   ASSESSMENT/PLAN:: 84 y.o. male here for evaluation of edema and tissue changes of the right lower extremity  Mr. Fermin is an 84 year old male with long history of right lower extremity edema.  Over the past several months he has noticed worsening tissue changes.  On exam he has indurated skin and stasis pigmentation changes to the level of the mid shin.  Right lower extremity venous reflux study was negative for DVT.  He only has mild deep and superficial venous reflux by duplex.  Fortunately he has no history of DVT or venous ulcerations and currently has no open wounds.  Recommendations included regular use of knee-high 15 to 20 mmHg compression socks.  He has a sock assist device that he can use to apply these.  We discussed proper leg elevation which should be performed periodically throughout the day.  We also discussed avoidance of prolonged sitting and standing.  I encouraged him to try to increase his mobility and walk for exercise.  Nothing further to offer from a vascular surgery standpoint.  He can follow-up on an as-needed basis.   Donnice Sender, PA-C [facility] [phone]      [1]  Allergies Allergen Reactions   Doxycycline Nausea And Vomiting   Oxycodone -Acetaminophen  Itching   "

## 2024-06-16 ENCOUNTER — Ambulatory Visit: Admitting: Neurology

## 2024-09-11 ENCOUNTER — Ambulatory Visit (HOSPITAL_COMMUNITY)

## 2024-09-11 ENCOUNTER — Encounter: Admitting: Vascular Surgery

## 2024-10-15 ENCOUNTER — Ambulatory Visit (HOSPITAL_BASED_OUTPATIENT_CLINIC_OR_DEPARTMENT_OTHER): Admitting: Family Medicine

## 2024-10-20 ENCOUNTER — Ambulatory Visit: Admitting: Neurology
